# Patient Record
Sex: Female | Born: 1960
Health system: Southern US, Community
[De-identification: ages and names within clinical notes are randomized; demographics above are authoritative.]

## PROBLEM LIST (undated history)

## (undated) DIAGNOSIS — J329 Chronic sinusitis, unspecified: Secondary | ICD-10-CM

## (undated) DIAGNOSIS — M1990 Unspecified osteoarthritis, unspecified site: Secondary | ICD-10-CM

## (undated) DIAGNOSIS — R112 Nausea with vomiting, unspecified: Secondary | ICD-10-CM

## (undated) DIAGNOSIS — K219 Gastro-esophageal reflux disease without esophagitis: Secondary | ICD-10-CM

## (undated) DIAGNOSIS — Z9889 Other specified postprocedural states: Secondary | ICD-10-CM

## (undated) DIAGNOSIS — R51 Headache: Secondary | ICD-10-CM

## (undated) DIAGNOSIS — C50A0 Malignant inflammatory neoplasm of unspecified breast: Secondary | ICD-10-CM

## (undated) DIAGNOSIS — I82409 Acute embolism and thrombosis of unspecified deep veins of unspecified lower extremity: Secondary | ICD-10-CM

## (undated) DIAGNOSIS — C50919 Malignant neoplasm of unspecified site of unspecified female breast: Secondary | ICD-10-CM

## (undated) DIAGNOSIS — E119 Type 2 diabetes mellitus without complications: Secondary | ICD-10-CM

## (undated) DIAGNOSIS — J302 Other seasonal allergic rhinitis: Secondary | ICD-10-CM

## (undated) DIAGNOSIS — E559 Vitamin D deficiency, unspecified: Secondary | ICD-10-CM

## (undated) HISTORY — DX: Malignant neoplasm of unspecified site of unspecified female breast: C50.919

## (undated) HISTORY — DX: Chronic sinusitis, unspecified: J32.9

## (undated) HISTORY — DX: Gastro-esophageal reflux disease without esophagitis: K21.9

## (undated) HISTORY — DX: Type 2 diabetes mellitus without complications: E11.9

## (undated) HISTORY — DX: Unspecified osteoarthritis, unspecified site: M19.90

## (undated) HISTORY — DX: Malignant inflammatory neoplasm of unspecified breast: C50.A0

## (undated) HISTORY — DX: Acute embolism and thrombosis of unspecified deep veins of unspecified lower extremity: I82.409

## (undated) HISTORY — DX: Vitamin D deficiency, unspecified: E55.9

## (undated) HISTORY — PX: TUBAL LIGATION: SHX77

---
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
## 1996-11-11 HISTORY — PX: KNEE ARTHROSCOPY: SUR90

## 1999-04-12 ENCOUNTER — Other Ambulatory Visit: Admission: RE | Admit: 1999-04-12 | Discharge: 1999-04-12 | Payer: Self-pay | Admitting: Obstetrics and Gynecology

## 1999-05-09 ENCOUNTER — Encounter: Payer: Self-pay | Admitting: Obstetrics and Gynecology

## 1999-05-09 ENCOUNTER — Ambulatory Visit (HOSPITAL_COMMUNITY): Admission: RE | Admit: 1999-05-09 | Discharge: 1999-05-09 | Payer: Self-pay | Admitting: Obstetrics and Gynecology

## 1999-08-17 ENCOUNTER — Ambulatory Visit (HOSPITAL_COMMUNITY): Admission: RE | Admit: 1999-08-17 | Discharge: 1999-08-17 | Payer: Self-pay | Admitting: Obstetrics & Gynecology

## 1999-08-17 ENCOUNTER — Encounter: Payer: Self-pay | Admitting: Obstetrics & Gynecology

## 1999-09-26 ENCOUNTER — Inpatient Hospital Stay (HOSPITAL_COMMUNITY): Admission: AD | Admit: 1999-09-26 | Discharge: 1999-09-29 | Payer: Self-pay | Admitting: Obstetrics and Gynecology

## 1999-09-26 ENCOUNTER — Encounter (INDEPENDENT_AMBULATORY_CARE_PROVIDER_SITE_OTHER): Payer: Self-pay

## 2004-11-25 ENCOUNTER — Emergency Department (HOSPITAL_COMMUNITY): Admission: EM | Admit: 2004-11-25 | Discharge: 2004-11-25 | Payer: Self-pay | Admitting: Emergency Medicine

## 2004-11-29 ENCOUNTER — Emergency Department (HOSPITAL_COMMUNITY): Admission: EM | Admit: 2004-11-29 | Discharge: 2004-11-29 | Payer: Self-pay | Admitting: Emergency Medicine

## 2008-08-01 ENCOUNTER — Encounter: Admission: RE | Admit: 2008-08-01 | Discharge: 2008-08-01 | Payer: Self-pay | Admitting: Family Medicine

## 2008-08-02 ENCOUNTER — Encounter (HOSPITAL_COMMUNITY): Admission: RE | Admit: 2008-08-02 | Discharge: 2008-09-01 | Payer: Self-pay | Admitting: General Surgery

## 2008-08-02 ENCOUNTER — Ambulatory Visit (HOSPITAL_COMMUNITY): Payer: Self-pay | Admitting: Oncology

## 2008-08-09 ENCOUNTER — Ambulatory Visit (HOSPITAL_COMMUNITY): Admission: RE | Admit: 2008-08-09 | Discharge: 2008-08-09 | Payer: Self-pay | Admitting: General Surgery

## 2008-08-09 HISTORY — PX: PORTACATH PLACEMENT: SHX2246

## 2008-08-26 ENCOUNTER — Ambulatory Visit: Payer: Self-pay | Admitting: Genetic Counselor

## 2008-08-31 ENCOUNTER — Encounter (HOSPITAL_COMMUNITY): Admission: RE | Admit: 2008-08-31 | Discharge: 2008-09-30 | Payer: Self-pay | Admitting: Oncology

## 2008-09-05 ENCOUNTER — Encounter (HOSPITAL_COMMUNITY): Payer: Self-pay | Admitting: Oncology

## 2008-09-21 ENCOUNTER — Ambulatory Visit (HOSPITAL_COMMUNITY): Payer: Self-pay | Admitting: Oncology

## 2008-10-03 ENCOUNTER — Encounter (HOSPITAL_COMMUNITY): Payer: Self-pay | Admitting: Oncology

## 2008-10-03 ENCOUNTER — Ambulatory Visit: Payer: Self-pay | Admitting: Cardiology

## 2008-10-03 ENCOUNTER — Encounter (HOSPITAL_COMMUNITY): Admission: RE | Admit: 2008-10-03 | Discharge: 2008-11-02 | Payer: Self-pay | Admitting: Oncology

## 2008-10-12 DIAGNOSIS — I82409 Acute embolism and thrombosis of unspecified deep veins of unspecified lower extremity: Secondary | ICD-10-CM

## 2008-10-12 HISTORY — DX: Acute embolism and thrombosis of unspecified deep veins of unspecified lower extremity: I82.409

## 2008-10-18 ENCOUNTER — Inpatient Hospital Stay (HOSPITAL_COMMUNITY): Admission: EM | Admit: 2008-10-18 | Discharge: 2008-11-02 | Payer: Self-pay | Admitting: Emergency Medicine

## 2008-10-19 ENCOUNTER — Ambulatory Visit: Payer: Self-pay | Admitting: Pulmonary Disease

## 2008-10-19 ENCOUNTER — Ambulatory Visit: Payer: Self-pay | Admitting: Infectious Diseases

## 2008-10-19 ENCOUNTER — Ambulatory Visit: Payer: Self-pay | Admitting: Oncology

## 2008-10-20 ENCOUNTER — Encounter: Payer: Self-pay | Admitting: Internal Medicine

## 2008-10-25 ENCOUNTER — Ambulatory Visit: Payer: Self-pay | Admitting: Surgery

## 2008-10-25 ENCOUNTER — Encounter (INDEPENDENT_AMBULATORY_CARE_PROVIDER_SITE_OTHER): Payer: Self-pay | Admitting: Cardiovascular Disease

## 2008-10-25 HISTORY — PX: INCISION AND DRAINAGE ABSCESS: SHX5864

## 2008-11-07 ENCOUNTER — Ambulatory Visit (HOSPITAL_COMMUNITY): Payer: Self-pay | Admitting: Internal Medicine

## 2008-11-07 ENCOUNTER — Encounter (HOSPITAL_COMMUNITY): Admission: RE | Admit: 2008-11-07 | Discharge: 2008-11-10 | Payer: Self-pay | Admitting: Oncology

## 2008-11-08 ENCOUNTER — Ambulatory Visit (HOSPITAL_COMMUNITY): Payer: Self-pay | Admitting: Oncology

## 2008-11-11 ENCOUNTER — Encounter (HOSPITAL_COMMUNITY): Admission: RE | Admit: 2008-11-11 | Discharge: 2008-12-11 | Payer: Self-pay | Admitting: Oncology

## 2008-11-14 ENCOUNTER — Ambulatory Visit (HOSPITAL_COMMUNITY): Payer: Self-pay | Admitting: Oncology

## 2008-12-12 ENCOUNTER — Encounter (HOSPITAL_COMMUNITY): Admission: RE | Admit: 2008-12-12 | Discharge: 2009-01-11 | Payer: Self-pay | Admitting: Oncology

## 2008-12-30 ENCOUNTER — Ambulatory Visit (HOSPITAL_COMMUNITY): Payer: Self-pay | Admitting: Oncology

## 2009-01-02 ENCOUNTER — Ambulatory Visit (HOSPITAL_COMMUNITY): Admission: RE | Admit: 2009-01-02 | Discharge: 2009-01-02 | Payer: Self-pay | Admitting: Oncology

## 2009-01-17 ENCOUNTER — Encounter (HOSPITAL_COMMUNITY): Admission: RE | Admit: 2009-01-17 | Discharge: 2009-02-08 | Payer: Self-pay | Admitting: Oncology

## 2009-02-11 HISTORY — PX: MASTECTOMY: SHX3

## 2009-02-16 ENCOUNTER — Encounter (HOSPITAL_COMMUNITY): Admission: RE | Admit: 2009-02-16 | Discharge: 2009-03-18 | Payer: Self-pay | Admitting: Oncology

## 2009-02-16 ENCOUNTER — Ambulatory Visit (HOSPITAL_COMMUNITY): Payer: Self-pay | Admitting: Oncology

## 2009-02-23 ENCOUNTER — Encounter (INDEPENDENT_AMBULATORY_CARE_PROVIDER_SITE_OTHER): Payer: Self-pay | Admitting: General Surgery

## 2009-02-23 ENCOUNTER — Inpatient Hospital Stay (HOSPITAL_COMMUNITY): Admission: RE | Admit: 2009-02-23 | Discharge: 2009-03-01 | Payer: Self-pay | Admitting: General Surgery

## 2009-02-23 HISTORY — PX: MASTECTOMY: SHX3

## 2009-03-17 ENCOUNTER — Ambulatory Visit: Admission: RE | Admit: 2009-03-17 | Discharge: 2009-05-09 | Payer: Self-pay | Admitting: Radiation Oncology

## 2009-03-28 ENCOUNTER — Encounter (HOSPITAL_COMMUNITY): Admission: RE | Admit: 2009-03-28 | Discharge: 2009-04-27 | Payer: Self-pay | Admitting: Oncology

## 2009-04-04 ENCOUNTER — Ambulatory Visit (HOSPITAL_COMMUNITY): Payer: Self-pay | Admitting: Oncology

## 2009-05-02 ENCOUNTER — Encounter (HOSPITAL_COMMUNITY): Admission: RE | Admit: 2009-05-02 | Discharge: 2009-06-01 | Payer: Self-pay | Admitting: Oncology

## 2009-06-15 ENCOUNTER — Encounter (HOSPITAL_COMMUNITY): Admission: RE | Admit: 2009-06-15 | Discharge: 2009-07-15 | Payer: Self-pay | Admitting: Oncology

## 2009-06-15 ENCOUNTER — Ambulatory Visit (HOSPITAL_COMMUNITY): Payer: Self-pay | Admitting: Oncology

## 2009-07-27 ENCOUNTER — Encounter (HOSPITAL_COMMUNITY): Admission: RE | Admit: 2009-07-27 | Discharge: 2009-08-26 | Payer: Self-pay | Admitting: Oncology

## 2009-08-01 ENCOUNTER — Ambulatory Visit (HOSPITAL_COMMUNITY): Payer: Self-pay | Admitting: Oncology

## 2009-09-07 ENCOUNTER — Encounter (HOSPITAL_COMMUNITY): Admission: RE | Admit: 2009-09-07 | Discharge: 2009-10-07 | Payer: Self-pay | Admitting: Oncology

## 2009-10-12 ENCOUNTER — Ambulatory Visit (HOSPITAL_COMMUNITY): Payer: Self-pay | Admitting: Oncology

## 2009-10-12 ENCOUNTER — Encounter (HOSPITAL_COMMUNITY)
Admission: RE | Admit: 2009-10-12 | Discharge: 2009-11-10 | Payer: Self-pay | Source: Home / Self Care | Admitting: Oncology

## 2009-11-13 ENCOUNTER — Encounter (HOSPITAL_COMMUNITY)
Admission: RE | Admit: 2009-11-13 | Discharge: 2009-12-13 | Payer: Self-pay | Source: Home / Self Care | Admitting: Oncology

## 2009-12-15 ENCOUNTER — Ambulatory Visit (HOSPITAL_COMMUNITY): Payer: Self-pay | Admitting: Oncology

## 2009-12-15 ENCOUNTER — Encounter (HOSPITAL_COMMUNITY)
Admission: RE | Admit: 2009-12-15 | Discharge: 2010-01-14 | Payer: Self-pay | Source: Home / Self Care | Admitting: Oncology

## 2010-02-09 ENCOUNTER — Encounter (HOSPITAL_COMMUNITY)
Admission: RE | Admit: 2010-02-09 | Discharge: 2010-03-11 | Payer: Self-pay | Source: Home / Self Care | Attending: Oncology | Admitting: Oncology

## 2010-02-09 ENCOUNTER — Ambulatory Visit (HOSPITAL_COMMUNITY): Payer: Self-pay | Admitting: Oncology

## 2010-03-04 ENCOUNTER — Encounter (HOSPITAL_COMMUNITY): Payer: Self-pay | Admitting: Oncology

## 2010-03-09 LAB — CBC
HCT: 37.1 % (ref 36.0–46.0)
Hemoglobin: 12.4 g/dL (ref 12.0–15.0)
MCH: 27.7 pg (ref 26.0–34.0)
MCHC: 33.4 g/dL (ref 30.0–36.0)
MCV: 83 fL (ref 78.0–100.0)
Platelets: 240 K/uL (ref 150–400)
RBC: 4.47 MIL/uL (ref 3.87–5.11)
RDW: 14.1 % (ref 11.5–15.5)
WBC: 4.3 K/uL (ref 4.0–10.5)

## 2010-03-09 LAB — DIFFERENTIAL
Basophils Absolute: 0 K/uL (ref 0.0–0.1)
Basophils Relative: 1 % (ref 0–1)
Eosinophils Absolute: 0.3 K/uL (ref 0.0–0.7)
Eosinophils Relative: 6 % — ABNORMAL HIGH (ref 0–5)
Lymphocytes Relative: 35 % (ref 12–46)
Lymphs Abs: 1.5 K/uL (ref 0.7–4.0)
Monocytes Absolute: 0.4 K/uL (ref 0.1–1.0)
Monocytes Relative: 10 % (ref 3–12)
Neutro Abs: 2.1 K/uL (ref 1.7–7.7)
Neutrophils Relative %: 48 % (ref 43–77)

## 2010-03-09 LAB — COMPREHENSIVE METABOLIC PANEL
BUN: 11 mg/dL (ref 6–23)
CO2: 26 mEq/L (ref 19–32)
Calcium: 9.1 mg/dL (ref 8.4–10.5)
Chloride: 108 mEq/L (ref 96–112)
Creatinine, Ser: 0.71 mg/dL (ref 0.4–1.2)
Potassium: 3.7 mEq/L (ref 3.5–5.1)

## 2010-03-09 LAB — PROTIME-INR
INR: 2.26 — ABNORMAL HIGH (ref 0.00–1.49)
Prothrombin Time: 25.1 s — ABNORMAL HIGH (ref 11.6–15.2)

## 2010-03-26 ENCOUNTER — Ambulatory Visit (HOSPITAL_COMMUNITY): Payer: Self-pay | Admitting: Oncology

## 2010-03-26 DIAGNOSIS — C50919 Malignant neoplasm of unspecified site of unspecified female breast: Secondary | ICD-10-CM

## 2010-04-06 ENCOUNTER — Encounter (HOSPITAL_COMMUNITY): Payer: Self-pay | Attending: Oncology

## 2010-04-06 ENCOUNTER — Other Ambulatory Visit (HOSPITAL_COMMUNITY): Payer: Self-pay

## 2010-04-06 DIAGNOSIS — C50919 Malignant neoplasm of unspecified site of unspecified female breast: Secondary | ICD-10-CM

## 2010-04-06 DIAGNOSIS — Z853 Personal history of malignant neoplasm of breast: Secondary | ICD-10-CM | POA: Insufficient documentation

## 2010-04-06 DIAGNOSIS — Z452 Encounter for adjustment and management of vascular access device: Secondary | ICD-10-CM

## 2010-04-06 DIAGNOSIS — Z86718 Personal history of other venous thrombosis and embolism: Secondary | ICD-10-CM | POA: Insufficient documentation

## 2010-04-20 ENCOUNTER — Other Ambulatory Visit (HOSPITAL_COMMUNITY): Payer: Self-pay

## 2010-04-24 LAB — PROTIME-INR: INR: 2.47 — ABNORMAL HIGH (ref 0.00–1.49)

## 2010-04-26 LAB — DIFFERENTIAL
Basophils Absolute: 0 10*3/uL (ref 0.0–0.1)
Basophils Relative: 1 % (ref 0–1)
Lymphocytes Relative: 30 % (ref 12–46)
Monocytes Relative: 9 % (ref 3–12)
Neutro Abs: 2.4 10*3/uL (ref 1.7–7.7)
Neutrophils Relative %: 55 % (ref 43–77)

## 2010-04-26 LAB — COMPREHENSIVE METABOLIC PANEL
Alkaline Phosphatase: 74 U/L (ref 39–117)
BUN: 14 mg/dL (ref 6–23)
Creatinine, Ser: 0.75 mg/dL (ref 0.4–1.2)
Glucose, Bld: 129 mg/dL — ABNORMAL HIGH (ref 70–99)
Potassium: 3.6 mEq/L (ref 3.5–5.1)
Total Bilirubin: 0.4 mg/dL (ref 0.3–1.2)
Total Protein: 7.3 g/dL (ref 6.0–8.3)

## 2010-04-26 LAB — CBC
HCT: 35.9 % — ABNORMAL LOW (ref 36.0–46.0)
MCH: 28 pg (ref 26.0–34.0)
MCV: 83.8 fL (ref 78.0–100.0)
Platelets: 255 10*3/uL (ref 150–400)
RDW: 14.6 % (ref 11.5–15.5)
WBC: 4.3 10*3/uL (ref 4.0–10.5)

## 2010-04-26 LAB — PROTIME-INR
INR: 2.49 — ABNORMAL HIGH (ref 0.00–1.49)
INR: 2.21 — ABNORMAL HIGH (ref 0.00–1.49)
Prothrombin Time: 24.7 seconds — ABNORMAL HIGH (ref 11.6–15.2)

## 2010-04-28 LAB — COMPREHENSIVE METABOLIC PANEL
Alkaline Phosphatase: 78 U/L (ref 39–117)
BUN: 12 mg/dL (ref 6–23)
Calcium: 9.4 mg/dL (ref 8.4–10.5)
Creatinine, Ser: 0.84 mg/dL (ref 0.4–1.2)
Glucose, Bld: 78 mg/dL (ref 70–99)
Total Protein: 7.5 g/dL (ref 6.0–8.3)

## 2010-04-28 LAB — CBC
HCT: 36 % (ref 36.0–46.0)
MCHC: 33.5 g/dL (ref 30.0–36.0)
MCV: 83.6 fL (ref 78.0–100.0)
RDW: 15.8 % — ABNORMAL HIGH (ref 11.5–15.5)

## 2010-04-28 LAB — DIFFERENTIAL
Basophils Relative: 1 % (ref 0–1)
Lymphocytes Relative: 33 % (ref 12–46)
Lymphs Abs: 1.7 10*3/uL (ref 0.7–4.0)
Monocytes Relative: 10 % (ref 3–12)
Neutro Abs: 2.5 10*3/uL (ref 1.7–7.7)
Neutrophils Relative %: 49 % (ref 43–77)

## 2010-04-28 LAB — PROTIME-INR: INR: 1.85 — ABNORMAL HIGH (ref 0.00–1.49)

## 2010-04-29 LAB — COMPREHENSIVE METABOLIC PANEL
ALT: 29 U/L (ref 0–35)
AST: 33 U/L (ref 0–37)
Albumin: 3.8 g/dL (ref 3.5–5.2)
CO2: 26 mEq/L (ref 19–32)
Calcium: 9.5 mg/dL (ref 8.4–10.5)
GFR calc Af Amer: >60 mL/min (ref >60)
Sodium: 140 mEq/L (ref 135–145)
Total Protein: 6.6 g/dL (ref 6.0–8.3)

## 2010-04-29 LAB — CBC
HCT: 28.8 % — ABNORMAL LOW (ref 36.0–46.0)
HCT: 25.4 % — ABNORMAL LOW (ref 36.0–46.0)
Hemoglobin: 8.1 g/dL — ABNORMAL LOW (ref 12.0–15.0)
Hemoglobin: 9.8 g/dL — ABNORMAL LOW (ref 12.0–15.0)
Hemoglobin: 8.6 g/dL — ABNORMAL LOW (ref 12.0–15.0)
MCHC: 33.6 g/dL (ref 30.0–36.0)
MCHC: 34.2 g/dL (ref 30.0–36.0)
MCHC: 33.4 g/dL (ref 30.0–36.0)
MCHC: 33.7 g/dL (ref 30.0–36.0)
MCV: 91.7 fL (ref 78.0–100.0)
MCV: 92.9 fL (ref 78.0–100.0)
MCV: 92.5 fL (ref 78.0–100.0)
MCV: 92.9 fL (ref 78.0–100.0)
MCV: 93.1 fL (ref 78.0–100.0)
Platelets: 187 10*3/uL (ref 150–400)
Platelets: 195 10*3/uL (ref 150–400)
RBC: 3.75 MIL/uL — ABNORMAL LOW (ref 3.87–5.11)
RBC: 2.94 MIL/uL — ABNORMAL LOW (ref 3.87–5.11)
RBC: 2.95 MIL/uL — ABNORMAL LOW (ref 3.87–5.11)
RDW: 17.4 % — ABNORMAL HIGH (ref 11.5–15.5)
RDW: 17.6 % — ABNORMAL HIGH (ref 11.5–15.5)
RDW: 17.9 % — ABNORMAL HIGH (ref 11.5–15.5)
RDW: 17.1 % — ABNORMAL HIGH (ref 11.5–15.5)
RDW: 17.2 % — ABNORMAL HIGH (ref 11.5–15.5)
RDW: 18 % — ABNORMAL HIGH (ref 11.5–15.5)
WBC: 6 10*3/uL (ref 4.0–10.5)
WBC: 6.4 10*3/uL (ref 4.0–10.5)

## 2010-04-29 LAB — PROTIME-INR
INR: 1.2 (ref 0.00–1.49)
INR: 1.29 (ref 0.00–1.49)
INR: 1.4 (ref 0.00–1.49)
INR: 2.32 — ABNORMAL HIGH (ref 0.00–1.49)
Prothrombin Time: 14.8 seconds (ref 11.6–15.2)
Prothrombin Time: 14.3 seconds (ref 11.6–15.2)
Prothrombin Time: 15.1 seconds (ref 11.6–15.2)
Prothrombin Time: 16 seconds — ABNORMAL HIGH (ref 11.6–15.2)
Prothrombin Time: 17 seconds — ABNORMAL HIGH (ref 11.6–15.2)
Prothrombin Time: 23.9 seconds — ABNORMAL HIGH (ref 11.6–15.2)

## 2010-04-29 LAB — URINALYSIS, DIPSTICK ONLY
Glucose, UA: NEGATIVE mg/dL
Hgb urine dipstick: NEGATIVE
Urobilinogen, UA: 0.2 mg/dL (ref 0.0–1.0)

## 2010-04-29 LAB — CROSSMATCH: ABO/RH(D): O POS

## 2010-04-29 LAB — HEPARIN LEVEL (UNFRACTIONATED)
Heparin Unfractionated: 0.11 IU/mL — ABNORMAL LOW (ref 0.30–0.70)
Heparin Unfractionated: 0.48 IU/mL (ref 0.30–0.70)
Heparin Unfractionated: 0.59 IU/mL (ref 0.30–0.70)
Heparin Unfractionated: <0.1 IU/mL — ABNORMAL LOW (ref 0.30–0.70)

## 2010-04-29 LAB — BASIC METABOLIC PANEL
BUN: 17 mg/dL (ref 6–23)
Chloride: 104 mEq/L (ref 96–112)
Glucose, Bld: 151 mg/dL — ABNORMAL HIGH (ref 70–99)
Potassium: 4.1 mEq/L (ref 3.5–5.1)

## 2010-04-29 LAB — DIFFERENTIAL
Eosinophils Absolute: 0.9 10*3/uL — ABNORMAL HIGH (ref 0.0–0.7)
Eosinophils Relative: 18 % — ABNORMAL HIGH (ref 0–5)
Lymphs Abs: 1.7 10*3/uL (ref 0.7–4.0)
Monocytes Absolute: 0.6 10*3/uL (ref 0.1–1.0)
Monocytes Relative: 12 % (ref 3–12)

## 2010-05-01 LAB — PROTIME-INR
INR: 2.04 — ABNORMAL HIGH (ref 0.00–1.49)
Prothrombin Time: 23.4 seconds — ABNORMAL HIGH (ref 11.6–15.2)

## 2010-05-02 LAB — PROTIME-INR
INR: 2.23 — ABNORMAL HIGH (ref 0.00–1.49)
Prothrombin Time: 24.5 seconds — ABNORMAL HIGH (ref 11.6–15.2)
Prothrombin Time: 19.8 seconds — ABNORMAL HIGH (ref 11.6–15.2)

## 2010-05-03 ENCOUNTER — Encounter (HOSPITAL_COMMUNITY): Payer: Self-pay | Attending: Oncology

## 2010-05-03 ENCOUNTER — Other Ambulatory Visit (HOSPITAL_COMMUNITY): Payer: Self-pay

## 2010-05-03 DIAGNOSIS — Z86718 Personal history of other venous thrombosis and embolism: Secondary | ICD-10-CM | POA: Insufficient documentation

## 2010-05-03 DIAGNOSIS — Z7901 Long term (current) use of anticoagulants: Secondary | ICD-10-CM | POA: Insufficient documentation

## 2010-05-03 DIAGNOSIS — C50919 Malignant neoplasm of unspecified site of unspecified female breast: Secondary | ICD-10-CM

## 2010-05-07 LAB — PROTIME-INR
INR: 1.96 — ABNORMAL HIGH (ref 0.00–1.49)
INR: 2.18 — ABNORMAL HIGH (ref 0.00–1.49)
INR: 2.35 — ABNORMAL HIGH (ref 0.00–1.49)
Prothrombin Time: 22.2 seconds — ABNORMAL HIGH (ref 11.6–15.2)
Prothrombin Time: 24.1 seconds — ABNORMAL HIGH (ref 11.6–15.2)
Prothrombin Time: 25.5 seconds — ABNORMAL HIGH (ref 11.6–15.2)

## 2010-05-15 LAB — COMPREHENSIVE METABOLIC PANEL
ALT: 14 U/L (ref 0–35)
AST: 21 U/L (ref 0–37)
CO2: 22 mEq/L (ref 19–32)
Chloride: 105 mEq/L (ref 96–112)
Creatinine, Ser: 0.6 mg/dL (ref 0.4–1.2)
GFR calc Af Amer: >60 mL/min (ref >60)
GFR calc non Af Amer: >60 mL/min (ref >60)
Glucose, Bld: 184 mg/dL — ABNORMAL HIGH (ref 70–99)
Sodium: 137 mEq/L (ref 135–145)
Total Bilirubin: 0.3 mg/dL (ref 0.3–1.2)

## 2010-05-15 LAB — DIFFERENTIAL
Basophils Absolute: 0 10*3/uL (ref 0.0–0.1)
Eosinophils Absolute: 0 10*3/uL (ref 0.0–0.7)
Eosinophils Relative: 0 % (ref 0–5)

## 2010-05-15 LAB — CBC
HCT: 30.3 % — ABNORMAL LOW (ref 36.0–46.0)
MCHC: 34.1 g/dL (ref 30.0–36.0)
MCV: 90.2 fL (ref 78.0–100.0)
Platelets: 187 10*3/uL (ref 150–400)

## 2010-05-15 LAB — PROTIME-INR: Prothrombin Time: 25 seconds — ABNORMAL HIGH (ref 11.6–15.2)

## 2010-05-16 LAB — DIFFERENTIAL
Basophils Absolute: 0 10*3/uL (ref 0.0–0.1)
Basophils Absolute: 0.1 10*3/uL (ref 0.0–0.1)
Basophils Relative: 1 % (ref 0–1)
Eosinophils Absolute: 0 10*3/uL (ref 0.0–0.7)
Eosinophils Relative: 0 % (ref 0–5)
Lymphocytes Relative: 22 % (ref 12–46)
Lymphs Abs: 1.7 10*3/uL (ref 0.7–4.0)
Monocytes Absolute: 0.3 10*3/uL (ref 0.1–1.0)
Monocytes Relative: 25 % — ABNORMAL HIGH (ref 3–12)
Neutro Abs: 1.6 10*3/uL — ABNORMAL LOW (ref 1.7–7.7)

## 2010-05-16 LAB — COMPREHENSIVE METABOLIC PANEL
ALT: 15 U/L (ref 0–35)
AST: 20 U/L (ref 0–37)
Albumin: 3.4 g/dL — ABNORMAL LOW (ref 3.5–5.2)
Alkaline Phosphatase: 58 U/L (ref 39–117)
BUN: 8 mg/dL (ref 6–23)
Chloride: 104 mEq/L (ref 96–112)
Potassium: 3.1 mEq/L — ABNORMAL LOW (ref 3.5–5.1)
Total Bilirubin: 0.5 mg/dL (ref 0.3–1.2)

## 2010-05-16 LAB — CBC
HCT: 30.6 % — ABNORMAL LOW (ref 36.0–46.0)
Hemoglobin: 10.6 g/dL — ABNORMAL LOW (ref 12.0–15.0)
Platelets: 163 10*3/uL (ref 150–400)
RBC: 3.56 MIL/uL — ABNORMAL LOW (ref 3.87–5.11)
WBC: 3.7 10*3/uL — ABNORMAL LOW (ref 4.0–10.5)
WBC: 7.8 10*3/uL (ref 4.0–10.5)

## 2010-05-16 LAB — PROTIME-INR
INR: 1.77 — ABNORMAL HIGH (ref 0.00–1.49)
Prothrombin Time: 20.5 seconds — ABNORMAL HIGH (ref 11.6–15.2)

## 2010-05-17 LAB — COMPREHENSIVE METABOLIC PANEL
ALT: 19 U/L (ref 0–35)
AST: 29 U/L (ref 0–37)
Albumin: 3.1 g/dL — ABNORMAL LOW (ref 3.5–5.2)
CO2: 23 mEq/L (ref 19–32)
CO2: 27 mEq/L (ref 19–32)
Calcium: 9.3 mg/dL (ref 8.4–10.5)
Calcium: 8.8 mg/dL (ref 8.4–10.5)
Chloride: 106 mEq/L (ref 96–112)
Creatinine, Ser: 0.47 mg/dL (ref 0.4–1.2)
Creatinine, Ser: 0.62 mg/dL (ref 0.4–1.2)
GFR calc Af Amer: >60 mL/min (ref >60)
GFR calc non Af Amer: >60 mL/min (ref >60)
GFR calc non Af Amer: >60 mL/min (ref >60)
Glucose, Bld: 157 mg/dL — ABNORMAL HIGH (ref 70–99)
Sodium: 139 mEq/L (ref 135–145)
Total Bilirubin: 0.3 mg/dL (ref 0.3–1.2)
Total Protein: 6.8 g/dL (ref 6.0–8.3)

## 2010-05-17 LAB — CBC
HCT: 28.9 % — ABNORMAL LOW (ref 36.0–46.0)
HCT: 27.6 % — ABNORMAL LOW (ref 36.0–46.0)
Hemoglobin: 9.9 g/dL — ABNORMAL LOW (ref 12.0–15.0)
Hemoglobin: 9.4 g/dL — ABNORMAL LOW (ref 12.0–15.0)
MCHC: 34.2 g/dL (ref 30.0–36.0)
MCHC: 34.2 g/dL (ref 30.0–36.0)
MCHC: 35.2 g/dL (ref 30.0–36.0)
MCV: 87.9 fL (ref 78.0–100.0)
MCV: 86.9 fL (ref 78.0–100.0)
MCV: 89.6 fL (ref 78.0–100.0)
Platelets: 198 10*3/uL (ref 150–400)
RBC: 3.29 MIL/uL — ABNORMAL LOW (ref 3.87–5.11)
RBC: 3.12 MIL/uL — ABNORMAL LOW (ref 3.87–5.11)
RBC: 3.17 MIL/uL — ABNORMAL LOW (ref 3.87–5.11)
RDW: 19 % — ABNORMAL HIGH (ref 11.5–15.5)

## 2010-05-17 LAB — DIFFERENTIAL
Basophils Absolute: 0 10*3/uL (ref 0.0–0.1)
Basophils Relative: 1 % (ref 0–1)
Eosinophils Absolute: 0 10*3/uL (ref 0.0–0.7)
Eosinophils Absolute: 0 10*3/uL (ref 0.0–0.7)
Eosinophils Absolute: 0 10*3/uL (ref 0.0–0.7)
Eosinophils Relative: 0 % (ref 0–5)
Eosinophils Relative: 0 % (ref 0–5)
Lymphocytes Relative: 14 % (ref 12–46)
Lymphocytes Relative: 12 % (ref 12–46)
Lymphs Abs: 1 10*3/uL (ref 0.7–4.0)
Lymphs Abs: 1.6 10*3/uL (ref 0.7–4.0)
Monocytes Absolute: 0.2 10*3/uL (ref 0.1–1.0)
Monocytes Absolute: 1.6 10*3/uL — ABNORMAL HIGH (ref 0.1–1.0)
Monocytes Relative: 12 % (ref 3–12)
Monocytes Relative: 5 % (ref 3–12)
Neutrophils Relative %: 83 % — ABNORMAL HIGH (ref 43–77)
Neutrophils Relative %: 74 % (ref 43–77)

## 2010-05-17 LAB — PROTIME-INR
INR: 2.35 — ABNORMAL HIGH (ref 0.00–1.49)
Prothrombin Time: 18.3 seconds — ABNORMAL HIGH (ref 11.6–15.2)

## 2010-05-17 LAB — MAGNESIUM: Magnesium: 2 mg/dL (ref 1.5–2.5)

## 2010-05-18 LAB — GLUCOSE, CAPILLARY
Glucose-Capillary: 114 mg/dL — ABNORMAL HIGH (ref 70–99)
Glucose-Capillary: 118 mg/dL — ABNORMAL HIGH (ref 70–99)
Glucose-Capillary: 119 mg/dL — ABNORMAL HIGH (ref 70–99)
Glucose-Capillary: 120 mg/dL — ABNORMAL HIGH (ref 70–99)
Glucose-Capillary: 121 mg/dL — ABNORMAL HIGH (ref 70–99)
Glucose-Capillary: 121 mg/dL — ABNORMAL HIGH (ref 70–99)
Glucose-Capillary: 123 mg/dL — ABNORMAL HIGH (ref 70–99)
Glucose-Capillary: 141 mg/dL — ABNORMAL HIGH (ref 70–99)
Glucose-Capillary: 145 mg/dL — ABNORMAL HIGH (ref 70–99)
Glucose-Capillary: 81 mg/dL (ref 70–99)
Glucose-Capillary: 100 mg/dL — ABNORMAL HIGH (ref 70–99)
Glucose-Capillary: 105 mg/dL — ABNORMAL HIGH (ref 70–99)
Glucose-Capillary: 115 mg/dL — ABNORMAL HIGH (ref 70–99)
Glucose-Capillary: 115 mg/dL — ABNORMAL HIGH (ref 70–99)
Glucose-Capillary: 118 mg/dL — ABNORMAL HIGH (ref 70–99)
Glucose-Capillary: 119 mg/dL — ABNORMAL HIGH (ref 70–99)
Glucose-Capillary: 121 mg/dL — ABNORMAL HIGH (ref 70–99)
Glucose-Capillary: 122 mg/dL — ABNORMAL HIGH (ref 70–99)
Glucose-Capillary: 124 mg/dL — ABNORMAL HIGH (ref 70–99)
Glucose-Capillary: 125 mg/dL — ABNORMAL HIGH (ref 70–99)
Glucose-Capillary: 132 mg/dL — ABNORMAL HIGH (ref 70–99)
Glucose-Capillary: 133 mg/dL — ABNORMAL HIGH (ref 70–99)
Glucose-Capillary: 134 mg/dL — ABNORMAL HIGH (ref 70–99)
Glucose-Capillary: 143 mg/dL — ABNORMAL HIGH (ref 70–99)
Glucose-Capillary: 146 mg/dL — ABNORMAL HIGH (ref 70–99)
Glucose-Capillary: 150 mg/dL — ABNORMAL HIGH (ref 70–99)
Glucose-Capillary: 152 mg/dL — ABNORMAL HIGH (ref 70–99)
Glucose-Capillary: 157 mg/dL — ABNORMAL HIGH (ref 70–99)

## 2010-05-18 LAB — BASIC METABOLIC PANEL
BUN: 5 mg/dL — ABNORMAL LOW (ref 6–23)
BUN: 39 mg/dL — ABNORMAL HIGH (ref 6–23)
BUN: 5 mg/dL — ABNORMAL LOW (ref 6–23)
CO2: 18 mEq/L — ABNORMAL LOW (ref 19–32)
CO2: 19 mEq/L (ref 19–32)
CO2: 19 mEq/L (ref 19–32)
CO2: 32 mEq/L (ref 19–32)
Calcium: 8.4 mg/dL (ref 8.4–10.5)
Calcium: 7.4 mg/dL — ABNORMAL LOW (ref 8.4–10.5)
Calcium: 7.9 mg/dL — ABNORMAL LOW (ref 8.4–10.5)
Chloride: 108 mEq/L (ref 96–112)
Chloride: 110 mEq/L (ref 96–112)
Creatinine, Ser: 0.59 mg/dL (ref 0.4–1.2)
Creatinine, Ser: 0.56 mg/dL (ref 0.4–1.2)
GFR calc Af Amer: 43 mL/min — ABNORMAL LOW (ref >60)
GFR calc Af Amer: >60 mL/min (ref >60)
GFR calc non Af Amer: >60 mL/min (ref >60)
GFR calc non Af Amer: 28 mL/min — ABNORMAL LOW (ref >60)
GFR calc non Af Amer: >60 mL/min (ref >60)
GFR calc non Af Amer: >60 mL/min (ref >60)
GFR calc non Af Amer: >60 mL/min (ref >60)
Glucose, Bld: 121 mg/dL — ABNORMAL HIGH (ref 70–99)
Glucose, Bld: 118 mg/dL — ABNORMAL HIGH (ref 70–99)
Glucose, Bld: 127 mg/dL — ABNORMAL HIGH (ref 70–99)
Glucose, Bld: 247 mg/dL — ABNORMAL HIGH (ref 70–99)
Glucose, Bld: 247 mg/dL — ABNORMAL HIGH (ref 70–99)
Potassium: 3.2 mEq/L — ABNORMAL LOW (ref 3.5–5.1)
Potassium: 3.3 mEq/L — ABNORMAL LOW (ref 3.5–5.1)
Potassium: 3.5 mEq/L (ref 3.5–5.1)
Sodium: 135 mEq/L (ref 135–145)
Sodium: 136 mEq/L (ref 135–145)
Sodium: 137 mEq/L (ref 135–145)
Sodium: 140 mEq/L (ref 135–145)

## 2010-05-18 LAB — CBC
HCT: 26.3 % — ABNORMAL LOW (ref 36.0–46.0)
HCT: 26.5 % — ABNORMAL LOW (ref 36.0–46.0)
HCT: 27.8 % — ABNORMAL LOW (ref 36.0–46.0)
HCT: 28.2 % — ABNORMAL LOW (ref 36.0–46.0)
HCT: 18.9 % — ABNORMAL LOW (ref 36.0–46.0)
HCT: 20.6 % — ABNORMAL LOW (ref 36.0–46.0)
HCT: 21 % — ABNORMAL LOW (ref 36.0–46.0)
HCT: 21.6 % — ABNORMAL LOW (ref 36.0–46.0)
HCT: 23.8 % — ABNORMAL LOW (ref 36.0–46.0)
HCT: 20.6 % — ABNORMAL LOW (ref 36.0–46.0)
HCT: 26.3 % — ABNORMAL LOW (ref 36.0–46.0)
Hemoglobin: 8.8 g/dL — ABNORMAL LOW (ref 12.0–15.0)
Hemoglobin: 9.1 g/dL — ABNORMAL LOW (ref 12.0–15.0)
Hemoglobin: 9.3 g/dL — ABNORMAL LOW (ref 12.0–15.0)
Hemoglobin: 9.4 g/dL — ABNORMAL LOW (ref 12.0–15.0)
Hemoglobin: 10.1 g/dL — ABNORMAL LOW (ref 12.0–15.0)
Hemoglobin: 6.4 g/dL — CL (ref 12.0–15.0)
Hemoglobin: 7.1 g/dL — CL (ref 12.0–15.0)
Hemoglobin: 7.3 g/dL — CL (ref 12.0–15.0)
Hemoglobin: 7.3 g/dL — CL (ref 12.0–15.0)
Hemoglobin: 7.4 g/dL — CL (ref 12.0–15.0)
Hemoglobin: 8.2 g/dL — ABNORMAL LOW (ref 12.0–15.0)
Hemoglobin: 8.4 g/dL — ABNORMAL LOW (ref 12.0–15.0)
Hemoglobin: 7.2 g/dL — CL (ref 12.0–15.0)
MCHC: 33.5 g/dL (ref 30.0–36.0)
MCHC: 33.5 g/dL (ref 30.0–36.0)
MCHC: 33.6 g/dL (ref 30.0–36.0)
MCHC: 33.8 g/dL (ref 30.0–36.0)
MCHC: 34.4 g/dL (ref 30.0–36.0)
MCHC: 33.7 g/dL (ref 30.0–36.0)
MCHC: 34 g/dL (ref 30.0–36.0)
MCHC: 34 g/dL (ref 30.0–36.0)
MCHC: 34.1 g/dL (ref 30.0–36.0)
MCHC: 34.6 g/dL (ref 30.0–36.0)
MCV: 88.7 fL (ref 78.0–100.0)
MCV: 88.9 fL (ref 78.0–100.0)
MCV: 88.9 fL (ref 78.0–100.0)
MCV: 89.1 fL (ref 78.0–100.0)
MCV: 84.1 fL (ref 78.0–100.0)
MCV: 84.8 fL (ref 78.0–100.0)
MCV: 88 fL (ref 78.0–100.0)
MCV: 83.4 fL (ref 78.0–100.0)
Platelets: 268 10*3/uL (ref 150–400)
Platelets: 282 10*3/uL (ref 150–400)
Platelets: 287 10*3/uL (ref 150–400)
Platelets: 336 10*3/uL (ref 150–400)
Platelets: 113 10*3/uL — ABNORMAL LOW (ref 150–400)
Platelets: 134 10*3/uL — ABNORMAL LOW (ref 150–400)
Platelets: 202 10*3/uL (ref 150–400)
Platelets: 39 10*3/uL — CL (ref 150–400)
RBC: 2.95 MIL/uL — ABNORMAL LOW (ref 3.87–5.11)
RBC: 3.36 MIL/uL — ABNORMAL LOW (ref 3.87–5.11)
RBC: 3.33 MIL/uL — ABNORMAL LOW (ref 3.87–5.11)
RBC: 2.58 MIL/uL — ABNORMAL LOW (ref 3.87–5.11)
RBC: 2.49 MIL/uL — ABNORMAL LOW (ref 3.87–5.11)
RBC: 3.15 MIL/uL — ABNORMAL LOW (ref 3.87–5.11)
RDW: 20.5 % — ABNORMAL HIGH (ref 11.5–15.5)
RDW: 20.6 % — ABNORMAL HIGH (ref 11.5–15.5)
RDW: 20.8 % — ABNORMAL HIGH (ref 11.5–15.5)
RDW: 21.4 % — ABNORMAL HIGH (ref 11.5–15.5)
RDW: 21.5 % — ABNORMAL HIGH (ref 11.5–15.5)
RDW: 21.3 % — ABNORMAL HIGH (ref 11.5–15.5)
RDW: 21.4 % — ABNORMAL HIGH (ref 11.5–15.5)
RDW: 22.1 % — ABNORMAL HIGH (ref 11.5–15.5)
RDW: 22.1 % — ABNORMAL HIGH (ref 11.5–15.5)
RDW: 22.5 % — ABNORMAL HIGH (ref 11.5–15.5)
RDW: 22.8 % — ABNORMAL HIGH (ref 11.5–15.5)
WBC: 1.2 10*3/uL — CL (ref 4.0–10.5)
WBC: 2.1 10*3/uL — ABNORMAL LOW (ref 4.0–10.5)
WBC: 3.4 10*3/uL — ABNORMAL LOW (ref 4.0–10.5)
WBC: 1.4 10*3/uL — CL (ref 4.0–10.5)
WBC: 0.5 10*3/uL — CL (ref 4.0–10.5)
WBC: 3.3 10*3/uL — ABNORMAL LOW (ref 4.0–10.5)
WBC: 3.6 10*3/uL — ABNORMAL LOW (ref 4.0–10.5)
WBC: <0.4 10*3/uL — CL (ref 4.0–10.5)
WBC: <0.4 10*3/uL — CL (ref 4.0–10.5)

## 2010-05-18 LAB — COMPREHENSIVE METABOLIC PANEL
ALT: 14 U/L (ref 0–35)
ALT: 15 U/L (ref 0–35)
ALT: 15 U/L (ref 0–35)
ALT: 14 U/L (ref 0–35)
ALT: 24 U/L (ref 0–35)
AST: 18 U/L (ref 0–37)
AST: 24 U/L (ref 0–37)
AST: 18 U/L (ref 0–37)
AST: 33 U/L (ref 0–37)
Albumin: 2.1 g/dL — ABNORMAL LOW (ref 3.5–5.2)
Albumin: 2.2 g/dL — ABNORMAL LOW (ref 3.5–5.2)
Albumin: 2.4 g/dL — ABNORMAL LOW (ref 3.5–5.2)
Albumin: 1.8 g/dL — ABNORMAL LOW (ref 3.5–5.2)
Alkaline Phosphatase: 64 U/L (ref 39–117)
Alkaline Phosphatase: 67 U/L (ref 39–117)
Alkaline Phosphatase: 70 U/L (ref 39–117)
Alkaline Phosphatase: 60 U/L (ref 39–117)
Alkaline Phosphatase: 31 U/L — ABNORMAL LOW (ref 39–117)
BUN: 3 mg/dL — ABNORMAL LOW (ref 6–23)
BUN: 4 mg/dL — ABNORMAL LOW (ref 6–23)
BUN: 4 mg/dL — ABNORMAL LOW (ref 6–23)
BUN: 9 mg/dL (ref 6–23)
BUN: 51 mg/dL — ABNORMAL HIGH (ref 6–23)
CO2: 29 mEq/L (ref 19–32)
CO2: 31 mEq/L (ref 19–32)
CO2: 27 mEq/L (ref 19–32)
CO2: 20 mEq/L (ref 19–32)
Calcium: 8.8 mg/dL (ref 8.4–10.5)
Calcium: 8.6 mg/dL (ref 8.4–10.5)
Calcium: 7.7 mg/dL — ABNORMAL LOW (ref 8.4–10.5)
Chloride: 101 mEq/L (ref 96–112)
Chloride: 104 mEq/L (ref 96–112)
Chloride: 105 mEq/L (ref 96–112)
Chloride: 103 mEq/L (ref 96–112)
Chloride: 105 mEq/L (ref 96–112)
Chloride: 95 mEq/L — ABNORMAL LOW (ref 96–112)
Creatinine, Ser: 0.53 mg/dL (ref 0.4–1.2)
Creatinine, Ser: 0.57 mg/dL (ref 0.4–1.2)
Creatinine, Ser: 0.68 mg/dL (ref 0.4–1.2)
Creatinine, Ser: 0.63 mg/dL (ref 0.4–1.2)
Creatinine, Ser: 4.16 mg/dL — ABNORMAL HIGH (ref 0.4–1.2)
GFR calc Af Amer: >60 mL/min (ref >60)
GFR calc Af Amer: >60 mL/min (ref >60)
GFR calc Af Amer: 14 mL/min — ABNORMAL LOW (ref >60)
GFR calc non Af Amer: >60 mL/min (ref >60)
GFR calc non Af Amer: >60 mL/min (ref >60)
GFR calc non Af Amer: >60 mL/min (ref >60)
GFR calc non Af Amer: 11 mL/min — ABNORMAL LOW (ref >60)
Glucose, Bld: 100 mg/dL — ABNORMAL HIGH (ref 70–99)
Glucose, Bld: 105 mg/dL — ABNORMAL HIGH (ref 70–99)
Glucose, Bld: 123 mg/dL — ABNORMAL HIGH (ref 70–99)
Glucose, Bld: 126 mg/dL — ABNORMAL HIGH (ref 70–99)
Glucose, Bld: 144 mg/dL — ABNORMAL HIGH (ref 70–99)
Glucose, Bld: 126 mg/dL — ABNORMAL HIGH (ref 70–99)
Glucose, Bld: 302 mg/dL — ABNORMAL HIGH (ref 70–99)
Potassium: 3.5 mEq/L (ref 3.5–5.1)
Potassium: 3.9 mEq/L (ref 3.5–5.1)
Potassium: 3.3 mEq/L — ABNORMAL LOW (ref 3.5–5.1)
Potassium: 3.1 mEq/L — ABNORMAL LOW (ref 3.5–5.1)
Sodium: 142 mEq/L (ref 135–145)
Sodium: 142 mEq/L (ref 135–145)
Sodium: 135 mEq/L (ref 135–145)
Total Bilirubin: 0.4 mg/dL (ref 0.3–1.2)
Total Bilirubin: 0.6 mg/dL (ref 0.3–1.2)
Total Bilirubin: 0.7 mg/dL (ref 0.3–1.2)
Total Bilirubin: 0.7 mg/dL (ref 0.3–1.2)
Total Bilirubin: 0.4 mg/dL (ref 0.3–1.2)
Total Bilirubin: 0.6 mg/dL (ref 0.3–1.2)
Total Bilirubin: 0.7 mg/dL (ref 0.3–1.2)
Total Protein: 5.4 g/dL — ABNORMAL LOW (ref 6.0–8.3)
Total Protein: 5.8 g/dL — ABNORMAL LOW (ref 6.0–8.3)
Total Protein: 4.5 g/dL — ABNORMAL LOW (ref 6.0–8.3)

## 2010-05-18 LAB — CULTURE, BLOOD (ROUTINE X 2)
Culture: NO GROWTH
Culture: NO GROWTH
Culture: NO GROWTH

## 2010-05-18 LAB — DIFFERENTIAL
Basophils Absolute: 0 10*3/uL (ref 0.0–0.1)
Basophils Absolute: 0 10*3/uL (ref 0.0–0.1)
Basophils Absolute: 0 10*3/uL (ref 0.0–0.1)
Basophils Absolute: 0 10*3/uL (ref 0.0–0.1)
Basophils Absolute: 0 10*3/uL (ref 0.0–0.1)
Basophils Absolute: 0 10*3/uL (ref 0.0–0.1)
Basophils Relative: 0 % (ref 0–1)
Basophils Relative: 1 % (ref 0–1)
Basophils Relative: 1 % (ref 0–1)
Basophils Relative: 2 % — ABNORMAL HIGH (ref 0–1)
Basophils Relative: 2 % — ABNORMAL HIGH (ref 0–1)
Basophils Relative: 0 % (ref 0–1)
Blasts: 0 %
Eosinophils Absolute: 0 10*3/uL (ref 0.0–0.7)
Eosinophils Absolute: 0 10*3/uL (ref 0.0–0.7)
Eosinophils Absolute: 0 10*3/uL (ref 0.0–0.7)
Eosinophils Absolute: 0 10*3/uL (ref 0.0–0.7)
Eosinophils Absolute: 0 10*3/uL (ref 0.0–0.7)
Eosinophils Absolute: 0 10*3/uL (ref 0.0–0.7)
Eosinophils Absolute: 0 10*3/uL (ref 0.0–0.7)
Eosinophils Relative: 0 % (ref 0–5)
Eosinophils Relative: 0 % (ref 0–5)
Eosinophils Relative: 0 % (ref 0–5)
Eosinophils Relative: 0 % (ref 0–5)
Lymphocytes Relative: 13 % (ref 12–46)
Lymphocytes Relative: 21 % (ref 12–46)
Lymphocytes Relative: 35 % (ref 12–46)
Lymphocytes Relative: 50 % — ABNORMAL HIGH (ref 12–46)
Lymphocytes Relative: 12 % (ref 12–46)
Lymphs Abs: 0.4 10*3/uL — ABNORMAL LOW (ref 0.7–4.0)
Lymphs Abs: 0.7 10*3/uL (ref 0.7–4.0)
Metamyelocytes Relative: 0 %
Monocytes Absolute: 0.2 10*3/uL (ref 0.1–1.0)
Monocytes Absolute: 0.4 10*3/uL (ref 0.1–1.0)
Monocytes Relative: 13 % — ABNORMAL HIGH (ref 3–12)
Monocytes Relative: 20 % — ABNORMAL HIGH (ref 3–12)
Monocytes Relative: 7 % (ref 3–12)
Monocytes Relative: 7 % (ref 3–12)
Neutro Abs: 1 10*3/uL — ABNORMAL LOW (ref 1.7–7.7)
Neutro Abs: 1.1 10*3/uL — ABNORMAL LOW (ref 1.7–7.7)
Neutro Abs: 1.6 10*3/uL — ABNORMAL LOW (ref 1.7–7.7)
Neutro Abs: 2.6 10*3/uL (ref 1.7–7.7)
Neutro Abs: 1.6 10*3/uL — ABNORMAL LOW (ref 1.7–7.7)
Neutrophils Relative %: 48 % (ref 43–77)
Neutrophils Relative %: 48 % (ref 43–77)
Neutrophils Relative %: 65 % (ref 43–77)
Neutrophils Relative %: 7 % — ABNORMAL LOW (ref 43–77)
Neutrophils Relative %: 81 % — ABNORMAL HIGH (ref 43–77)
Neutrophils Relative %: 81 % — ABNORMAL HIGH (ref 43–77)
nRBC: 0 /100 WBC
nRBC: 0 /100 WBC

## 2010-05-18 LAB — URINALYSIS, ROUTINE W REFLEX MICROSCOPIC
Bilirubin Urine: NEGATIVE
Glucose, UA: 100 mg/dL — AB
Specific Gravity, Urine: 1.03 (ref 1.005–1.030)
pH: 5 (ref 5.0–8.0)

## 2010-05-18 LAB — HSV(HERPES SMPLX)ABS-I+II(IGG+IGM)-BLD
Herpes Simplex Vrs I + II Ab, IgG: 33.1 IV — ABNORMAL HIGH
Herpes Simplex Vrs I&II-IgM Ab (EIA): 0.71 INDEX

## 2010-05-18 LAB — PATHOLOGIST SMEAR REVIEW

## 2010-05-18 LAB — HEPARIN LEVEL (UNFRACTIONATED)
Heparin Unfractionated: 0.12 IU/mL — ABNORMAL LOW (ref 0.30–0.70)
Heparin Unfractionated: 0.28 IU/mL — ABNORMAL LOW (ref 0.30–0.70)
Heparin Unfractionated: 0.38 IU/mL (ref 0.30–0.70)
Heparin Unfractionated: 0.43 IU/mL (ref 0.30–0.70)
Heparin Unfractionated: 0.48 IU/mL (ref 0.30–0.70)
Heparin Unfractionated: 0.64 IU/mL (ref 0.30–0.70)
Heparin Unfractionated: <0.1 IU/mL — ABNORMAL LOW (ref 0.30–0.70)

## 2010-05-18 LAB — BRAIN NATRIURETIC PEPTIDE: Pro B Natriuretic peptide (BNP): 174 pg/mL — ABNORMAL HIGH (ref 0.0–100.0)

## 2010-05-18 LAB — TYPE AND SCREEN
Antibody Screen: NEGATIVE
Antibody Screen: NEGATIVE

## 2010-05-18 LAB — CULTURE, ROUTINE-ABSCESS

## 2010-05-18 LAB — STREP PNEUMONIAE URINARY ANTIGEN: Strep Pneumo Urinary Antigen: NEGATIVE

## 2010-05-18 LAB — URINE CULTURE
Colony Count: NO GROWTH
Culture: NO GROWTH

## 2010-05-18 LAB — PROTIME-INR
INR: 1.1 (ref 0.00–1.49)
INR: 1.1 (ref 0.00–1.49)
INR: 1.4 (ref 0.00–1.49)
INR: 1.5 (ref 0.00–1.49)
INR: 1.7 — ABNORMAL HIGH (ref 0.00–1.49)
Prothrombin Time: 14.3 seconds (ref 11.6–15.2)
Prothrombin Time: 14.4 seconds (ref 11.6–15.2)
Prothrombin Time: 14.8 seconds (ref 11.6–15.2)
Prothrombin Time: 15.2 seconds (ref 11.6–15.2)
Prothrombin Time: 17 seconds — ABNORMAL HIGH (ref 11.6–15.2)
Prothrombin Time: 19.4 seconds — ABNORMAL HIGH (ref 11.6–15.2)
Prothrombin Time: 25.4 seconds — ABNORMAL HIGH (ref 11.6–15.2)

## 2010-05-18 LAB — APTT: aPTT: 35 seconds (ref 24–37)

## 2010-05-18 LAB — MAGNESIUM
Magnesium: 2.3 mg/dL (ref 1.5–2.5)
Magnesium: 1.7 mg/dL (ref 1.5–2.5)
Magnesium: 1.9 mg/dL (ref 1.5–2.5)

## 2010-05-18 LAB — HSV PCR: HSV 2 , PCR: NOT DETECTED

## 2010-05-18 LAB — CARBOXYHEMOGLOBIN
Methemoglobin: 0.6 % (ref 0.0–1.5)
O2 Saturation: 77 %
Total hemoglobin: 7.3 g/dL — CL (ref 12.5–16.0)

## 2010-05-18 LAB — PHOSPHORUS
Phosphorus: 1.9 mg/dL — ABNORMAL LOW (ref 2.3–4.6)
Phosphorus: 2.1 mg/dL — ABNORMAL LOW (ref 2.3–4.6)
Phosphorus: 2.8 mg/dL (ref 2.3–4.6)
Phosphorus: 2.9 mg/dL (ref 2.3–4.6)

## 2010-05-18 LAB — PREPARE RBC (CROSSMATCH)

## 2010-05-18 LAB — URINE MICROSCOPIC-ADD ON

## 2010-05-18 LAB — CREATININE, URINE, RANDOM: Creatinine, Urine: 77.6 mg/dL

## 2010-05-18 LAB — LACTIC ACID, PLASMA: Lactic Acid, Venous: 2.5 mmol/L — ABNORMAL HIGH (ref 0.5–2.2)

## 2010-05-18 LAB — VANCOMYCIN, TROUGH: Vancomycin Tr: 17.5 ug/mL (ref 10.0–20.0)

## 2010-05-18 LAB — ABO/RH: ABO/RH(D): O POS

## 2010-05-19 LAB — DIFFERENTIAL
Basophils Absolute: 0 10*3/uL (ref 0.0–0.1)
Basophils Absolute: 0 10*3/uL (ref 0.0–0.1)
Eosinophils Absolute: 0 10*3/uL (ref 0.0–0.7)
Eosinophils Relative: 0 % (ref 0–5)
Eosinophils Relative: 0 % (ref 0–5)
Lymphocytes Relative: 15 % (ref 12–46)
Lymphocytes Relative: 18 % (ref 12–46)
Lymphs Abs: 0.5 10*3/uL — ABNORMAL LOW (ref 0.7–4.0)
Lymphs Abs: 0.9 10*3/uL (ref 0.7–4.0)
Monocytes Absolute: 0.6 10*3/uL (ref 0.1–1.0)
Monocytes Absolute: 0.9 10*3/uL (ref 0.1–1.0)
Monocytes Relative: 11 % (ref 3–12)
Neutro Abs: 3.7 10*3/uL (ref 1.7–7.7)
Neutrophils Relative %: 81 % — ABNORMAL HIGH (ref 43–77)
Neutrophils Relative %: 70 % (ref 43–77)

## 2010-05-19 LAB — CBC
HCT: 29.4 % — ABNORMAL LOW (ref 36.0–46.0)
HCT: 30.6 % — ABNORMAL LOW (ref 36.0–46.0)
Hemoglobin: 10.4 g/dL — ABNORMAL LOW (ref 12.0–15.0)
MCHC: 33.9 g/dL (ref 30.0–36.0)
MCV: 81.2 fL (ref 78.0–100.0)
MCV: 81.6 fL (ref 78.0–100.0)
MCV: 81.1 fL (ref 78.0–100.0)
Platelets: 192 10*3/uL (ref 150–400)
Platelets: 213 10*3/uL (ref 150–400)
RBC: 3.76 MIL/uL — ABNORMAL LOW (ref 3.87–5.11)
RDW: 21.1 % — ABNORMAL HIGH (ref 11.5–15.5)
RDW: 16.9 % — ABNORMAL HIGH (ref 11.5–15.5)
WBC: 5.1 10*3/uL (ref 4.0–10.5)
WBC: 5.8 10*3/uL (ref 4.0–10.5)

## 2010-05-19 LAB — COMPREHENSIVE METABOLIC PANEL
ALT: 13 U/L (ref 0–35)
AST: 20 U/L (ref 0–37)
Albumin: 3.5 g/dL (ref 3.5–5.2)
Alkaline Phosphatase: 72 U/L (ref 39–117)
CO2: 28 mEq/L (ref 19–32)
Chloride: 108 mEq/L (ref 96–112)
GFR calc Af Amer: >60 mL/min (ref >60)
GFR calc non Af Amer: >60 mL/min (ref >60)
Potassium: 3.9 mEq/L (ref 3.5–5.1)
Sodium: 139 mEq/L (ref 135–145)
Total Bilirubin: 0.4 mg/dL (ref 0.3–1.2)

## 2010-05-19 LAB — URINALYSIS, MICROSCOPIC ONLY
Bilirubin Urine: NEGATIVE
Ketones, ur: NEGATIVE mg/dL
Protein, ur: NEGATIVE mg/dL
Specific Gravity, Urine: 1.02 (ref 1.005–1.030)
Urobilinogen, UA: 0.2 mg/dL (ref 0.0–1.0)

## 2010-05-19 LAB — URINE CULTURE
Colony Count: 40000
Special Requests: POSITIVE

## 2010-05-19 LAB — URINALYSIS, ROUTINE W REFLEX MICROSCOPIC
Leukocytes, UA: NEGATIVE
Nitrite: NEGATIVE
Protein, ur: NEGATIVE mg/dL

## 2010-05-19 LAB — URINE MICROSCOPIC-ADD ON

## 2010-05-20 LAB — DIFFERENTIAL
Eosinophils Absolute: 0 10*3/uL (ref 0.0–0.7)
Lymphs Abs: 0.9 10*3/uL (ref 0.7–4.0)
Monocytes Relative: 8 % (ref 3–12)
Neutrophils Relative %: 79 % — ABNORMAL HIGH (ref 43–77)

## 2010-05-20 LAB — URINE CULTURE
Colony Count: NO GROWTH
Special Requests: POSITIVE

## 2010-05-20 LAB — URINALYSIS, MICROSCOPIC ONLY
Glucose, UA: NEGATIVE mg/dL
Protein, ur: NEGATIVE mg/dL
Specific Gravity, Urine: 1.02 (ref 1.005–1.030)

## 2010-05-20 LAB — CBC
MCV: 80.5 fL (ref 78.0–100.0)
RBC: 3.83 MIL/uL — ABNORMAL LOW (ref 3.87–5.11)
WBC: 7 10*3/uL (ref 4.0–10.5)

## 2010-05-21 LAB — COMPREHENSIVE METABOLIC PANEL
Albumin: 3.3 g/dL — ABNORMAL LOW (ref 3.5–5.2)
BUN: 10 mg/dL (ref 6–23)
Creatinine, Ser: 0.74 mg/dL (ref 0.4–1.2)
Total Bilirubin: 0.3 mg/dL (ref 0.3–1.2)
Total Protein: 8.1 g/dL (ref 6.0–8.3)

## 2010-05-21 LAB — DIFFERENTIAL
Basophils Absolute: 0 10*3/uL (ref 0.0–0.1)
Lymphocytes Relative: 21 % (ref 12–46)
Monocytes Absolute: 0.8 10*3/uL (ref 0.1–1.0)
Monocytes Relative: 10 % (ref 3–12)
Neutro Abs: 5.5 10*3/uL (ref 1.7–7.7)

## 2010-05-21 LAB — CBC
HCT: 34.4 % — ABNORMAL LOW (ref 36.0–46.0)
MCV: 81.2 fL (ref 78.0–100.0)
Platelets: 364 10*3/uL (ref 150–400)
RDW: 15 % (ref 11.5–15.5)

## 2010-05-21 LAB — APTT: aPTT: 35 seconds (ref 24–37)

## 2010-05-21 LAB — PROTIME-INR: INR: 1.1 (ref 0.00–1.49)

## 2010-05-31 ENCOUNTER — Encounter (HOSPITAL_COMMUNITY): Payer: Self-pay | Attending: Oncology

## 2010-05-31 DIAGNOSIS — C50919 Malignant neoplasm of unspecified site of unspecified female breast: Secondary | ICD-10-CM

## 2010-05-31 DIAGNOSIS — Z853 Personal history of malignant neoplasm of breast: Secondary | ICD-10-CM | POA: Insufficient documentation

## 2010-05-31 DIAGNOSIS — Z86718 Personal history of other venous thrombosis and embolism: Secondary | ICD-10-CM | POA: Insufficient documentation

## 2010-06-01 ENCOUNTER — Encounter (HOSPITAL_COMMUNITY): Payer: Self-pay

## 2010-06-26 NOTE — Op Note (Signed)
[name], Wendy Gilbert                  ACCOUNT NO.:  [id number]   MEDICAL RECORD NO.:  [id number]          PATIENT TYPE:  AMB   LOCATION:  DAY                           FACILITY:  [facility]   PHYSICIAN:  Dalia Heading, M.D.  DATE OF BIRTH:  02/03/1961   DATE OF PROCEDURE:  08/09/2008  DATE OF DISCHARGE:  08/09/2008                               OPERATIVE REPORT   PREOPERATIVE DIAGNOSIS:  Right breast carcinoma, inflammatory.   POSTOPERATIVE DIAGNOSIS:  Right breast carcinoma, inflammatory.   PROCEDURE:  Port-A-Cath insertion.   SURGEON:  Dalia Heading, MD   ANESTHESIA:  MAC.   INDICATIONS:  The patient is a 50 year old, white female, who is about  to undergo chemotherapy for inflammatory breast carcinoma on the right  side.  The risks and benefits of the procedure including bleeding,  infection, pneumothorax were fully explained to the patient, gave  informed consent.   PROCEDURE NOTE:  The patient was placed in the Trendelenburg position  after left upper chest was prepped and draped using the usual sterile  technique with DuraPrep.  Surgical site confirmation was performed.  A  1% Xylocaine was used for local anesthesia.   A transverse incision was made below the left clavicle.  Subcutaneous  pocket was then formed.  Needle was advanced into the left subclavian  vein using the Seldinger technique without difficulty.  Guidewire was  then advanced into the right atrium under fluoroscopic guidance.  An  introducer and peel-away sheath were placed over the guidewire.  The  catheter was then inserted through the peel-away sheath and the peel-  away sheath was removed.  The catheter was then attached to the port and  the port placed in subcutaneous pocket.  Adequate positioning was  confirmed by fluoroscopy.  The port was flushed with 3000 units of  heparin.  The subcutaneous layer was reapproximated using a 3-0 Vicryl  interrupted suture.  The skin was closed using a 4-0 Vicryl  subcuticular  suture.  Dermabond was then applied.   The right breast lesion was noted to be fungating in nature and Xeroform  was applied.  All tape and needle counts correct at the end of the  procedure.  The patient was awakened and transferred to PACU in stable  condition.   COMPLICATIONS:  None.   SPECIMEN:  None.   BLOOD LOSS:  Minimal.      Dalia Heading, M.D.  Electronically Signed     MAJ/MEDQ  D:  08/09/2008  T:  08/10/2008  Job:  [id number]   cc:   Health Department [facility]  Fax: [phone]   Ladona Horns. Mariel Sleet, MD  Fax: [phone]

## 2010-06-26 NOTE — H&P (Signed)
NAME:  Wendy Gilbert, Wendy Gilbert                  ACCOUNT NO.:  [id number]   MEDICAL RECORD NO.:  [id number]          PATIENT TYPE:  AMB   LOCATION:  DAY                           FACILITY:  [facility]   PHYSICIAN:  Dalia Heading, M.D.  DATE OF BIRTH:  24-Jul-1960   DATE OF ADMISSION:  DATE OF DISCHARGE:  LH                              HISTORY & PHYSICAL   CHIEF COMPLAINT:  Right breast carcinoma, need for central venous  access.   HISTORY OF PRESENT ILLNESS:  The patient is a 50 year old white female,  who is referred for evaluation and treatment of a right breast  carcinoma.  This was found recently on biopsy of the breast and is  positive for carcinoma.  She is about to undergo chemotherapy  preoperatively to shrink the tumor.  The tumor appears to be a stage III  right breast cancer.   PAST MEDICAL HISTORY:  Unremarkable.   PAST SURGICAL HISTORY:  Knee surgery, C-sections.   CURRENT MEDICATIONS:  Prilosec.   ALLERGIES:  PENICILLIN, ERYTHROMYCIN, DEMEROL.   REVIEW OF SYSTEMS:  Noncontributory.   PHYSICAL EXAMINATION:  GENERAL:  The patient is a well-developed, well-  nourished, white female, in no acute distress.  LUNGS:  Clear to auscultation with equal breath sounds bilaterally.  HEART:  Regular rate and rhythm without S3, S4, murmurs.   IMPRESSION:  Right breast carcinoma, stage III.   PLAN:  The patient was scheduled for Port-A-Cath insertion on August 09, 2008.  The risks and benefits of the procedure including bleeding,  infection, and pneumothorax were fully explained to the patient, gave  informed consent.       Dalia Heading, M.D.  Electronically Signed     MAJ/MEDQ  D:  08/04/2008  T:  08/05/2008  Job:  [id number]   cc:   Ladona Horns. Mariel Sleet, MD  Fax: [phone]   Health Department [facility]  Fax: [phone]

## 2010-06-29 ENCOUNTER — Encounter (HOSPITAL_COMMUNITY): Payer: Self-pay | Attending: Oncology

## 2010-06-29 ENCOUNTER — Other Ambulatory Visit (HOSPITAL_COMMUNITY): Payer: Self-pay | Admitting: Oncology

## 2010-06-29 DIAGNOSIS — C50919 Malignant neoplasm of unspecified site of unspecified female breast: Secondary | ICD-10-CM

## 2010-06-29 DIAGNOSIS — Z86718 Personal history of other venous thrombosis and embolism: Secondary | ICD-10-CM | POA: Insufficient documentation

## 2010-06-29 DIAGNOSIS — Z853 Personal history of malignant neoplasm of breast: Secondary | ICD-10-CM | POA: Insufficient documentation

## 2010-06-29 LAB — PROTIME-INR: Prothrombin Time: 25.5 seconds — ABNORMAL HIGH (ref 11.6–15.2)

## 2010-06-29 NOTE — Discharge Summary (Signed)
[facility]  Patient:    Wendy Gilbert, Wendy Gilbert                           MRN: [id number] Adm. Date:  [id number] Disc. Date: [id number] Attending:  Leonard Schwartz                           Discharge Summary  DATE OF BIRTH:                1961-01-15  ADMISSION DIAGNOSES:          Intrauterine pregnancy at 62 weeks, multiparous, proteinuria, history of herpes simplex virus, breech positioned baby, and preeclampsia desiring permanent sterilization.  DISCHARGE DIAGNOSES:          Intrauterine pregnancy at 38 weeks, multiparous, proteinuria, history of herpes simplex virus, breech positioned baby, and preeclampsia desiring permanent sterilization, delivered by primary lower segment transverse cesarean section, viable baby girl weighing 6 pounds 6 ounces, Apgars 8 and 9.  Recovered satisfactorily from tubal ligation and resolving preeclampsia.  PROCEDURE:                    Primary lower segment transverse cesarean section and bilateral tubal ligation.  ANESTHESIA:                   Spinal.  Adult intensive care unit monitoring. Magnesium sulfate therapy.  COURSE OF HOSPITALIZATION:    On August 15 50 year old gravida 3, para 1-0-1-1 [name] at 38 weeks presented with 2+ proteinuria in a catheter sample with a diastolic blood pressure of 90.  She had a history of HSV, but no prodromal symptoms currently.  Her baby by ultrasound is found to be breech with normal fluid.  The patient was offered an external version but she declined and wants to have a cesarean section and tubal ligation.  Risks and benefits were reviewed and she proceeded to have an uncomplicated primary cesarean section and delivered a viable baby girl weighing 6 pounds 6 ounces named Cassandra, Apgars 8 and 9.  She also had bilateral tubal ligation at her request without complications.  On the August 16 the very next morning after her surgery, her intake and output was 1000/9/700.   Blood pressures ranged 115-148/60-78.  O2 saturation was 91-100%.  She was receiving magnesium sulfate therapy.  Her liver enzymes:  SGOT 24, SGPT 17, magnesium 3.8, uric acid 5.4, LDH 217. Hemoglobin had dropped to 10.9, hematocrit 32.1, platelets 264, white count 16.1.  She recovered satisfactorily on her first day.  On postoperative day #2 on the seventeenth she denied any swelling, headaches, or other PIH symptoms. Her blood pressures ranged 120-140/60-70.  Abdomen was soft.  Incision clean, dry, and intact.  Her urine output was greater than 100 cc/hour.  It appeared that her preeclampsia was improving.  Magnesium sulfate was discontinued and she was transferred to the postpartum unit.  On postoperative day #3, August 18, she continued to recover satisfactorily.  Incision was clean, dry, and intact.  She was passing flatus, tolerating a regular diet well, breast-feeding satisfactorily.  Her incision was clean, dry, and intact.  She had trace edema.  Urine output had been 650 cc over the preceding night.  She had been voiding well.  Blood pressures ranged 110-134/70-80.  After deeming to have received the full benefit of her hospitalization, she was discharged home in stable condition  after removal of staples and application of Steri-Strips.  DISCHARGE INSTRUCTIONS:       PIH precautions and [facility] booklet.  DISCHARGE MEDICATIONS:        Motrin, Tylox, prenatal vitamins, over-the-counter iron.  DISCHARGE FOLLOW-UP:          In six weeks at [facility]. DD:  09/29/99 TD:  10/01/99 Job: [id number]

## 2010-07-27 ENCOUNTER — Encounter (HOSPITAL_COMMUNITY): Payer: Self-pay | Attending: Oncology

## 2010-07-27 ENCOUNTER — Other Ambulatory Visit (HOSPITAL_COMMUNITY): Payer: Self-pay | Admitting: Oncology

## 2010-07-27 DIAGNOSIS — C50919 Malignant neoplasm of unspecified site of unspecified female breast: Secondary | ICD-10-CM

## 2010-07-27 DIAGNOSIS — Z853 Personal history of malignant neoplasm of breast: Secondary | ICD-10-CM | POA: Insufficient documentation

## 2010-07-27 DIAGNOSIS — Z86718 Personal history of other venous thrombosis and embolism: Secondary | ICD-10-CM | POA: Insufficient documentation

## 2010-07-27 DIAGNOSIS — I82409 Acute embolism and thrombosis of unspecified deep veins of unspecified lower extremity: Secondary | ICD-10-CM

## 2010-07-27 LAB — PROTIME-INR: INR: 1.96 — ABNORMAL HIGH (ref 0.00–1.49)

## 2010-07-31 ENCOUNTER — Encounter (HOSPITAL_COMMUNITY): Payer: Self-pay | Admitting: *Deleted

## 2010-08-01 ENCOUNTER — Encounter (HOSPITAL_COMMUNITY): Payer: Self-pay | Admitting: Oncology

## 2010-08-01 ENCOUNTER — Other Ambulatory Visit (HOSPITAL_COMMUNITY): Payer: Self-pay | Admitting: Oncology

## 2010-08-01 DIAGNOSIS — C50919 Malignant neoplasm of unspecified site of unspecified female breast: Secondary | ICD-10-CM | POA: Insufficient documentation

## 2010-08-03 ENCOUNTER — Encounter (HOSPITAL_COMMUNITY): Payer: Self-pay

## 2010-08-03 ENCOUNTER — Other Ambulatory Visit (HOSPITAL_COMMUNITY): Payer: Self-pay | Admitting: Oncology

## 2010-08-03 DIAGNOSIS — C50919 Malignant neoplasm of unspecified site of unspecified female breast: Secondary | ICD-10-CM

## 2010-08-03 DIAGNOSIS — I82409 Acute embolism and thrombosis of unspecified deep veins of unspecified lower extremity: Secondary | ICD-10-CM

## 2010-08-17 ENCOUNTER — Other Ambulatory Visit (HOSPITAL_COMMUNITY): Payer: Self-pay | Admitting: Oncology

## 2010-08-17 ENCOUNTER — Encounter (HOSPITAL_COMMUNITY): Payer: Self-pay | Attending: Oncology

## 2010-08-17 DIAGNOSIS — I82409 Acute embolism and thrombosis of unspecified deep veins of unspecified lower extremity: Secondary | ICD-10-CM | POA: Insufficient documentation

## 2010-08-17 DIAGNOSIS — C50919 Malignant neoplasm of unspecified site of unspecified female breast: Secondary | ICD-10-CM | POA: Insufficient documentation

## 2010-08-17 LAB — CBC
HCT: 37.8 % (ref 36.0–46.0)
Hemoglobin: 12.2 g/dL (ref 12.0–15.0)
MCV: 83.6 fL (ref 78.0–100.0)
RBC: 4.52 MIL/uL (ref 3.87–5.11)
RDW: 14.6 % (ref 11.5–15.5)
WBC: 4.7 10*3/uL (ref 4.0–10.5)

## 2010-08-17 LAB — PROTIME-INR: INR: 2.49 — ABNORMAL HIGH (ref 0.00–1.49)

## 2010-08-17 LAB — COMPREHENSIVE METABOLIC PANEL
AST: 28 U/L (ref 0–37)
Albumin: 3.6 g/dL (ref 3.5–5.2)
Calcium: 9.2 mg/dL (ref 8.4–10.5)
Chloride: 105 mEq/L (ref 96–112)
Creatinine, Ser: 0.59 mg/dL (ref 0.50–1.10)
Sodium: 140 mEq/L (ref 135–145)
Total Bilirubin: 0.2 mg/dL — ABNORMAL LOW (ref 0.3–1.2)

## 2010-08-17 LAB — DIFFERENTIAL
Eosinophils Relative: 11 % — ABNORMAL HIGH (ref 0–5)
Lymphocytes Relative: 33 % (ref 12–46)
Lymphs Abs: 1.6 10*3/uL (ref 0.7–4.0)
Neutro Abs: 2.2 10*3/uL (ref 1.7–7.7)

## 2010-08-24 ENCOUNTER — Other Ambulatory Visit (HOSPITAL_COMMUNITY): Payer: Self-pay

## 2010-08-27 ENCOUNTER — Encounter (HOSPITAL_COMMUNITY): Payer: Self-pay | Admitting: Oncology

## 2010-08-27 ENCOUNTER — Encounter (HOSPITAL_BASED_OUTPATIENT_CLINIC_OR_DEPARTMENT_OTHER): Payer: Self-pay | Admitting: Oncology

## 2010-08-27 VITALS — BP 144/88 | HR 87 | Wt 205.0 lb

## 2010-08-27 DIAGNOSIS — I82409 Acute embolism and thrombosis of unspecified deep veins of unspecified lower extremity: Secondary | ICD-10-CM

## 2010-08-27 DIAGNOSIS — C50919 Malignant neoplasm of unspecified site of unspecified female breast: Secondary | ICD-10-CM

## 2010-08-27 NOTE — Progress Notes (Signed)
This office note has been dictated.

## 2010-08-27 NOTE — Patient Instructions (Signed)
[facility]  Discharge Instructions  RECOMMENDATIONS MADE BY THE CONSULTANT AND ANY TEST RESULTS WILL BE SENT TO YOUR REFERRING DOCTOR.   EXAM FINDINGS BY MD TODAY AND SIGNS AND SYMPTOMS TO REPORT TO CLINIC OR PRIMARY MD:  Exam good  MEDICATIONS PRESCRIBED: none  INSTRUCTIONS GIVEN AND DISCUSSED: Other  none  SPECIAL INSTRUCTIONS/FOLLOW-UP: Lab work Needed  In one month pt level Other labs in 6 months   I acknowledge that I have been informed and understand all the instructions given to me and received a copy. I do not have any more questions at this time, but understand that I may call the Specialty Clinic at [facility] at [phone] during business hours should I have any further questions or need assistance in obtaining follow-up care.    __________________________________________  _____________  __________ Signature of Patient or Authorized Representative            Date                   Time    __________________________________________ Nurse's Signature

## 2010-09-14 ENCOUNTER — Encounter (HOSPITAL_COMMUNITY): Payer: Self-pay | Attending: Oncology

## 2010-09-14 ENCOUNTER — Other Ambulatory Visit (HOSPITAL_COMMUNITY): Payer: Self-pay | Admitting: Oncology

## 2010-09-14 ENCOUNTER — Encounter (HOSPITAL_COMMUNITY): Payer: Self-pay | Admitting: Oncology

## 2010-09-14 ENCOUNTER — Telehealth (HOSPITAL_COMMUNITY): Payer: Self-pay | Admitting: *Deleted

## 2010-09-14 DIAGNOSIS — Z452 Encounter for adjustment and management of vascular access device: Secondary | ICD-10-CM

## 2010-09-14 DIAGNOSIS — I82409 Acute embolism and thrombosis of unspecified deep veins of unspecified lower extremity: Secondary | ICD-10-CM | POA: Insufficient documentation

## 2010-09-14 DIAGNOSIS — C50919 Malignant neoplasm of unspecified site of unspecified female breast: Secondary | ICD-10-CM | POA: Insufficient documentation

## 2010-09-14 LAB — PROTIME-INR: INR: 2.79 — ABNORMAL HIGH (ref 0.00–1.49)

## 2010-09-14 MED ORDER — SODIUM CHLORIDE 0.9 % IJ SOLN
INTRAMUSCULAR | Status: AC
  Administered 2010-09-14: 10 mL via INTRAVENOUS
  Filled 2010-09-14: qty 20

## 2010-09-14 MED ORDER — HEPARIN SOD (PORK) LOCK FLUSH 100 UNIT/ML IV SOLN
500.0000 [IU] | Freq: Once | INTRAVENOUS | Status: AC
  Administered 2010-09-14: 500 [IU] via INTRAVENOUS

## 2010-09-14 MED ORDER — HEPARIN SOD (PORK) LOCK FLUSH 100 UNIT/ML IV SOLN
INTRAVENOUS | Status: AC
  Administered 2010-09-14: 500 [IU] via INTRAVENOUS
  Filled 2010-09-14: qty 5

## 2010-09-14 MED ORDER — SODIUM CHLORIDE 0.9 % IJ SOLN
10.0000 mL | Freq: Once | INTRAMUSCULAR | Status: AC
  Administered 2010-09-14: 10 mL via INTRAVENOUS

## 2010-09-14 MED ORDER — SODIUM CHLORIDE 0.9 % IJ SOLN
INTRAMUSCULAR | Status: AC
  Filled 2010-09-14: qty 10

## 2010-09-14 NOTE — Telephone Encounter (Signed)
Message left for pt to continue same dose coumadin and pt level in one month.

## 2010-09-14 NOTE — Progress Notes (Signed)
Mickel Duhamel Deeney presented for Portacath access and flush. Proper placement of portacath confirmed by CXR. Portacath located left chest wall accessed with  H 20 needle. Sluggish blood return and flushed with 3 10 cc saline syringes. Specimen obtained for pt level. Portacath flushed with 20ml NS and 500U/19ml Heparin and needle removed intact. Procedure without incident. Patient tolerated procedure well.

## 2010-10-17 ENCOUNTER — Other Ambulatory Visit (HOSPITAL_COMMUNITY): Payer: Self-pay | Admitting: Oncology

## 2010-10-17 ENCOUNTER — Encounter (HOSPITAL_COMMUNITY): Payer: Self-pay | Attending: Oncology

## 2010-10-17 DIAGNOSIS — I82409 Acute embolism and thrombosis of unspecified deep veins of unspecified lower extremity: Secondary | ICD-10-CM | POA: Insufficient documentation

## 2010-10-17 DIAGNOSIS — I82629 Acute embolism and thrombosis of deep veins of unspecified upper extremity: Secondary | ICD-10-CM

## 2010-10-17 LAB — PROTIME-INR: Prothrombin Time: 23.2 seconds — ABNORMAL HIGH (ref 11.6–15.2)

## 2010-10-17 MED ORDER — SODIUM CHLORIDE 0.9 % IJ SOLN
INTRAMUSCULAR | Status: AC
  Administered 2010-10-17: 10 mL
  Filled 2010-10-17: qty 10

## 2010-10-17 MED ORDER — HEPARIN SOD (PORK) LOCK FLUSH 100 UNIT/ML IV SOLN
INTRAVENOUS | Status: AC
  Administered 2010-10-17: 500 [IU]
  Filled 2010-10-17: qty 5

## 2010-10-17 NOTE — Progress Notes (Unsigned)
Mickel Duhamel [place] presented for Portacath access and flush. Proper placement of portacath confirmed by CXR. Portacath located LT chest wall accessed with  H 20 needle. Good blood return present. Portacath flushed with 20ml NS and 500U/52ml Heparin and needle removed intact. Procedure without incident. Patient tolerated procedure well.  PT/INR drawn.

## 2010-11-14 ENCOUNTER — Telehealth (HOSPITAL_COMMUNITY): Payer: Self-pay

## 2010-11-14 ENCOUNTER — Encounter (HOSPITAL_COMMUNITY): Payer: Self-pay | Attending: Oncology

## 2010-11-14 ENCOUNTER — Other Ambulatory Visit (HOSPITAL_COMMUNITY): Payer: Self-pay | Admitting: Oncology

## 2010-11-14 DIAGNOSIS — I82409 Acute embolism and thrombosis of unspecified deep veins of unspecified lower extremity: Secondary | ICD-10-CM

## 2010-11-14 MED ORDER — SODIUM CHLORIDE 0.9 % IJ SOLN
10.0000 mL | INTRAMUSCULAR | Status: DC | PRN
  Administered 2010-11-14: 10 mL via INTRAVENOUS
  Filled 2010-11-14: qty 10

## 2010-11-14 MED ORDER — SODIUM CHLORIDE 0.9 % IJ SOLN
INTRAMUSCULAR | Status: AC
  Administered 2010-11-14: 10 mL via INTRAVENOUS
  Filled 2010-11-14: qty 10

## 2010-11-14 MED ORDER — HEPARIN SOD (PORK) LOCK FLUSH 100 UNIT/ML IV SOLN
INTRAVENOUS | Status: AC
  Administered 2010-11-14: 500 [IU] via INTRAVENOUS
  Filled 2010-11-14: qty 5

## 2010-11-14 MED ORDER — HEPARIN SOD (PORK) LOCK FLUSH 100 UNIT/ML IV SOLN
500.0000 [IU] | Freq: Once | INTRAVENOUS | Status: AC
  Administered 2010-11-14: 500 [IU] via INTRAVENOUS
  Filled 2010-11-14: qty 5

## 2010-11-14 NOTE — Telephone Encounter (Signed)
Patient called to inform of lab results and to continue current dose of Coumadin.  Next lab appointment also given.

## 2010-11-14 NOTE — Progress Notes (Signed)
Wendy Gilbert presented for Portacath access and flush. Proper placement of portacath confirmed by CXR. Portacath located left chest wall accessed with  H 20 needle. Good blood return present. PT/INR drawm. Portacath flushed with 20ml NS and 500U/75ml Heparin and needle removed intact. Procedure without incident. Patient tolerated procedure well.

## 2010-11-20 ENCOUNTER — Encounter (INDEPENDENT_AMBULATORY_CARE_PROVIDER_SITE_OTHER): Payer: Self-pay | Admitting: General Surgery

## 2010-12-11 ENCOUNTER — Encounter (INDEPENDENT_AMBULATORY_CARE_PROVIDER_SITE_OTHER): Payer: Self-pay | Admitting: General Surgery

## 2010-12-11 ENCOUNTER — Ambulatory Visit (INDEPENDENT_AMBULATORY_CARE_PROVIDER_SITE_OTHER): Payer: Self-pay | Admitting: General Surgery

## 2010-12-11 VITALS — BP 118/82 | HR 64 | Temp 97.7°F | Resp 14 | Ht 63.5 in | Wt 203.8 lb

## 2010-12-11 DIAGNOSIS — C773 Secondary and unspecified malignant neoplasm of axilla and upper limb lymph nodes: Secondary | ICD-10-CM

## 2010-12-11 DIAGNOSIS — C50919 Malignant neoplasm of unspecified site of unspecified female breast: Secondary | ICD-10-CM

## 2010-12-11 NOTE — Progress Notes (Signed)
Operation: Right MRM and Left SM for inflammatory right breast CA  Date:02/2009  Stage: T4N3M0  Hormone receptor status: neg.  HPI:  Wendy Gilbert is here for followup of her right breast cancer.  She is feeling well.  She denies any chest wall nodules.  She denies any adenopathy.  She is still on Coumadin.  PE:Gen- WDWN in NAD.  Chest:  Bilateral chest wall scars with no palpable nodules; right chest wall with radiation changes to the skin; Portacath in left chest wall.  Lymph nodes:  No palpable cervical, supraclavicular or axillary adenopathy.  Assessment:  Right Breast cancer with no clinical evidence of recurrence.   Plan:  Return visit in 4-5 months.

## 2010-12-17 ENCOUNTER — Other Ambulatory Visit (HOSPITAL_COMMUNITY): Payer: Self-pay

## 2010-12-19 ENCOUNTER — Telehealth (HOSPITAL_COMMUNITY): Payer: Self-pay

## 2010-12-19 ENCOUNTER — Other Ambulatory Visit (HOSPITAL_COMMUNITY): Payer: Self-pay | Admitting: Oncology

## 2010-12-19 ENCOUNTER — Encounter (HOSPITAL_COMMUNITY): Payer: Self-pay | Attending: Oncology

## 2010-12-19 DIAGNOSIS — I82409 Acute embolism and thrombosis of unspecified deep veins of unspecified lower extremity: Secondary | ICD-10-CM

## 2010-12-19 LAB — PROTIME-INR: Prothrombin Time: 26.4 seconds — ABNORMAL HIGH (ref 11.6–15.2)

## 2010-12-19 MED ORDER — SODIUM CHLORIDE 0.9 % IJ SOLN
10.0000 mL | INTRAMUSCULAR | Status: DC | PRN
  Administered 2010-12-19: 10 mL via INTRAVENOUS
  Filled 2010-12-19: qty 10

## 2010-12-19 MED ORDER — HEPARIN SOD (PORK) LOCK FLUSH 100 UNIT/ML IV SOLN
500.0000 [IU] | Freq: Once | INTRAVENOUS | Status: AC
  Administered 2010-12-19: 500 [IU] via INTRAVENOUS
  Filled 2010-12-19: qty 5

## 2010-12-19 NOTE — Telephone Encounter (Signed)
error 

## 2010-12-19 NOTE — Telephone Encounter (Signed)
Instructed to continue coumadin (7 mg/ 7 mg / 6 mg/ 7 mg /7 mg/ 6 mg,) etc. at same dosage and to return in 1 month for next PT level 01/16/11.

## 2010-12-19 NOTE — Progress Notes (Signed)
Wendy Gilbert presented for Portacath access and flush. Proper placement of portacath confirmed by CXR. Portacath located lt chest wall accessed with  H 20 needle. Good blood return present. PT INR drawn from port after 20cc waste. Portacath flushed with 20ml NS and 500U/36ml Heparin and needle removed intact. Procedure without incident. Patient tolerated procedure well.

## 2011-01-16 ENCOUNTER — Other Ambulatory Visit (HOSPITAL_COMMUNITY): Payer: Self-pay | Admitting: Oncology

## 2011-01-16 ENCOUNTER — Encounter (HOSPITAL_COMMUNITY): Payer: Medicare Other | Attending: Oncology

## 2011-01-16 DIAGNOSIS — I82409 Acute embolism and thrombosis of unspecified deep veins of unspecified lower extremity: Secondary | ICD-10-CM | POA: Insufficient documentation

## 2011-01-16 DIAGNOSIS — I82629 Acute embolism and thrombosis of deep veins of unspecified upper extremity: Secondary | ICD-10-CM

## 2011-01-16 MED ORDER — HEPARIN SOD (PORK) LOCK FLUSH 100 UNIT/ML IV SOLN
500.0000 [IU] | Freq: Once | INTRAVENOUS | Status: AC
  Administered 2011-01-16: 500 [IU] via INTRAVENOUS
  Filled 2011-01-16: qty 5

## 2011-01-16 MED ORDER — SODIUM CHLORIDE 0.9 % IJ SOLN
10.0000 mL | INTRAMUSCULAR | Status: DC | PRN
  Administered 2011-01-16: 10 mL via INTRAVENOUS
  Filled 2011-01-16: qty 10

## 2011-01-16 MED ORDER — HEPARIN SOD (PORK) LOCK FLUSH 100 UNIT/ML IV SOLN
INTRAVENOUS | Status: AC
  Administered 2011-01-16: 500 [IU] via INTRAVENOUS
  Filled 2011-01-16: qty 5

## 2011-01-16 MED ORDER — SODIUM CHLORIDE 0.9 % IJ SOLN
INTRAMUSCULAR | Status: AC
  Administered 2011-01-16: 10 mL via INTRAVENOUS
  Filled 2011-01-16: qty 10

## 2011-01-16 NOTE — Progress Notes (Signed)
Wendy Gilbert presented for Portacath access and flush. Proper placement of portacath confirmed by CXR. Portacath located lt chest wall accessed with  H 20 needle. Good blood return present.pt-inr drawn. Portacath flushed with 20ml NS and 500U/17ml Heparin and needle removed intact. Procedure without incident. Patient tolerated procedure well.

## 2011-01-23 ENCOUNTER — Other Ambulatory Visit (HOSPITAL_COMMUNITY): Payer: Self-pay | Admitting: Oncology

## 2011-01-23 ENCOUNTER — Encounter (HOSPITAL_BASED_OUTPATIENT_CLINIC_OR_DEPARTMENT_OTHER): Payer: Medicare Other

## 2011-01-23 DIAGNOSIS — I82409 Acute embolism and thrombosis of unspecified deep veins of unspecified lower extremity: Secondary | ICD-10-CM

## 2011-01-23 DIAGNOSIS — C50419 Malignant neoplasm of upper-outer quadrant of unspecified female breast: Secondary | ICD-10-CM

## 2011-01-23 LAB — PROTIME-INR
INR: 2.05 — ABNORMAL HIGH (ref 0.00–1.49)
Prothrombin Time: 23.5 seconds — ABNORMAL HIGH (ref 11.6–15.2)

## 2011-01-23 MED ORDER — SODIUM CHLORIDE 0.9 % IJ SOLN
INTRAMUSCULAR | Status: AC
  Administered 2011-01-23: 10 mL via INTRAVENOUS
  Filled 2011-01-23: qty 20

## 2011-01-23 MED ORDER — SODIUM CHLORIDE 0.9 % IJ SOLN
10.0000 mL | INTRAMUSCULAR | Status: DC | PRN
  Administered 2011-01-23: 10 mL via INTRAVENOUS
  Filled 2011-01-23: qty 10

## 2011-01-23 MED ORDER — HEPARIN SOD (PORK) LOCK FLUSH 100 UNIT/ML IV SOLN
500.0000 [IU] | Freq: Once | INTRAVENOUS | Status: AC
  Administered 2011-01-23: 500 [IU] via INTRAVENOUS
  Filled 2011-01-23: qty 5

## 2011-01-23 MED ORDER — HEPARIN SOD (PORK) LOCK FLUSH 100 UNIT/ML IV SOLN
INTRAVENOUS | Status: AC
  Administered 2011-01-23: 500 [IU] via INTRAVENOUS
  Filled 2011-01-23: qty 5

## 2011-01-23 NOTE — Progress Notes (Signed)
Wendy Gilbert presented for labwork. Labs per MD order drawn via Portacath located in the left chest wall accessed with  H 20 needle. Good blood return present. Procedure without incident.  Portacath flushed with 20ml NS and 500U/56ml Heparin per protocol and needle removed intact. Patient tolerated procedure well.

## 2011-02-06 ENCOUNTER — Encounter (HOSPITAL_BASED_OUTPATIENT_CLINIC_OR_DEPARTMENT_OTHER): Payer: Medicare Other

## 2011-02-06 ENCOUNTER — Other Ambulatory Visit (HOSPITAL_COMMUNITY): Payer: Self-pay | Admitting: *Deleted

## 2011-02-06 DIAGNOSIS — I82409 Acute embolism and thrombosis of unspecified deep veins of unspecified lower extremity: Secondary | ICD-10-CM

## 2011-02-06 LAB — PROTIME-INR: INR: 2.75 — ABNORMAL HIGH (ref 0.00–1.49)

## 2011-02-06 MED ORDER — HEPARIN SOD (PORK) LOCK FLUSH 100 UNIT/ML IV SOLN
500.0000 [IU] | Freq: Once | INTRAVENOUS | Status: DC
  Filled 2011-02-06: qty 5

## 2011-02-06 MED ORDER — SODIUM CHLORIDE 0.9 % IJ SOLN
INTRAMUSCULAR | Status: AC
  Filled 2011-02-06: qty 10

## 2011-02-06 MED ORDER — SODIUM CHLORIDE 0.9 % IJ SOLN
10.0000 mL | INTRAMUSCULAR | Status: DC | PRN
  Filled 2011-02-06: qty 10

## 2011-02-06 MED ORDER — HEPARIN SOD (PORK) LOCK FLUSH 100 UNIT/ML IV SOLN
INTRAVENOUS | Status: AC
  Filled 2011-02-06: qty 5

## 2011-02-06 NOTE — Progress Notes (Signed)
Mickel Duhamel Br[place]ock presented for Portacath access and flush. Proper placement of portacath confirmed by CXR. Portacath located left chest wall accessed with  H 20 needle. Good blood return present. Lab drawn for PT/INR. Portacath flushed with 20ml NS and 500U/24ml Heparin and needle removed intact. Procedure without incident. Patient tolerated procedure well.  Current Coumadin dose 7mg  7mg  6mg  alternating.

## 2011-02-06 NOTE — Progress Notes (Signed)
Coumadin 7/7/6.  INR in 2 weeks. February 20, 2011

## 2011-02-20 ENCOUNTER — Encounter (HOSPITAL_COMMUNITY): Payer: Medicare Other | Attending: Oncology

## 2011-02-20 DIAGNOSIS — I82629 Acute embolism and thrombosis of deep veins of unspecified upper extremity: Secondary | ICD-10-CM

## 2011-02-20 DIAGNOSIS — I82409 Acute embolism and thrombosis of unspecified deep veins of unspecified lower extremity: Secondary | ICD-10-CM | POA: Insufficient documentation

## 2011-02-20 DIAGNOSIS — C50919 Malignant neoplasm of unspecified site of unspecified female breast: Secondary | ICD-10-CM

## 2011-02-20 LAB — DIFFERENTIAL
Basophils Relative: 1 % (ref 0–1)
Eosinophils Relative: 3 % (ref 0–5)
Monocytes Absolute: 0.3 10*3/uL (ref 0.1–1.0)
Monocytes Relative: 7 % (ref 3–12)
Neutro Abs: 2.4 10*3/uL (ref 1.7–7.7)

## 2011-02-20 LAB — PROTIME-INR: INR: 1.92 — ABNORMAL HIGH (ref 0.00–1.49)

## 2011-02-20 LAB — COMPREHENSIVE METABOLIC PANEL
Albumin: 3.5 g/dL (ref 3.5–5.2)
BUN: 13 mg/dL (ref 6–23)
CO2: 30 mEq/L (ref 19–32)
Calcium: 9.5 mg/dL (ref 8.4–10.5)
Chloride: 105 mEq/L (ref 96–112)
Creatinine, Ser: 0.67 mg/dL (ref 0.50–1.10)
GFR calc non Af Amer: >90 mL/min (ref >90)
Total Bilirubin: 0.5 mg/dL (ref 0.3–1.2)

## 2011-02-20 LAB — CBC
HCT: 36.1 % (ref 36.0–46.0)
Hemoglobin: 11.8 g/dL — ABNORMAL LOW (ref 12.0–15.0)
MCHC: 32.7 g/dL (ref 30.0–36.0)
MCV: 83.4 fL (ref 78.0–100.0)

## 2011-02-20 MED ORDER — SODIUM CHLORIDE 0.9 % IJ SOLN
10.0000 mL | INTRAMUSCULAR | Status: DC | PRN
  Administered 2011-02-20: 10 mL via INTRAVENOUS
  Filled 2011-02-20: qty 10

## 2011-02-20 MED ORDER — HEPARIN SOD (PORK) LOCK FLUSH 100 UNIT/ML IV SOLN
500.0000 [IU] | Freq: Once | INTRAVENOUS | Status: AC
  Administered 2011-02-20: 500 [IU] via INTRAVENOUS
  Filled 2011-02-20: qty 5

## 2011-02-20 MED ORDER — SODIUM CHLORIDE 0.9 % IJ SOLN
INTRAMUSCULAR | Status: AC
  Administered 2011-02-20: 10 mL via INTRAVENOUS
  Filled 2011-02-20: qty 10

## 2011-02-20 MED ORDER — HEPARIN SOD (PORK) LOCK FLUSH 100 UNIT/ML IV SOLN
INTRAVENOUS | Status: AC
  Administered 2011-02-20: 500 [IU] via INTRAVENOUS
  Filled 2011-02-20: qty 5

## 2011-02-20 NOTE — Progress Notes (Signed)
Wendy Gilbert presented for Portacath access and flush. Proper placement of portacath confirmed by CXR. Portacath located lt chest wall accessed with  H 20 needle. Good blood return present. Portacath flushed with 20ml NS and 500U/39ml Heparin and needle removed intact. Procedure without incident. Patient tolerated procedure well.  On coumadin 7-7-6

## 2011-02-21 ENCOUNTER — Telehealth (HOSPITAL_COMMUNITY): Payer: Self-pay | Admitting: Oncology

## 2011-02-21 NOTE — Progress Notes (Signed)
Patient instructed to continue same coumadin dose and return in 2 weeks for pt/inr. Pt verbalized understanding.

## 2011-02-21 NOTE — Progress Notes (Signed)
Addended by: Oda Kilts on: 02/21/2011 02:36 PM   Modules accepted: Orders

## 2011-02-27 ENCOUNTER — Other Ambulatory Visit (HOSPITAL_COMMUNITY): Payer: Self-pay

## 2011-02-27 ENCOUNTER — Other Ambulatory Visit (HOSPITAL_COMMUNITY): Payer: Medicare Other

## 2011-03-01 ENCOUNTER — Ambulatory Visit (HOSPITAL_COMMUNITY): Payer: Medicare Other | Admitting: Oncology

## 2011-03-01 ENCOUNTER — Other Ambulatory Visit (HOSPITAL_COMMUNITY): Payer: Medicare Other

## 2011-03-12 ENCOUNTER — Ambulatory Visit (HOSPITAL_COMMUNITY): Payer: Medicare Other | Admitting: Oncology

## 2011-03-12 ENCOUNTER — Encounter (HOSPITAL_COMMUNITY): Payer: Self-pay | Admitting: Oncology

## 2011-03-12 ENCOUNTER — Encounter (HOSPITAL_BASED_OUTPATIENT_CLINIC_OR_DEPARTMENT_OTHER): Payer: Medicare Other | Admitting: Oncology

## 2011-03-12 ENCOUNTER — Encounter (HOSPITAL_COMMUNITY): Payer: Medicare Other

## 2011-03-12 VITALS — BP 119/76 | HR 75 | Temp 98.0°F | Wt 202.7 lb

## 2011-03-12 DIAGNOSIS — A498 Other bacterial infections of unspecified site: Secondary | ICD-10-CM

## 2011-03-12 DIAGNOSIS — E119 Type 2 diabetes mellitus without complications: Secondary | ICD-10-CM

## 2011-03-12 DIAGNOSIS — I82409 Acute embolism and thrombosis of unspecified deep veins of unspecified lower extremity: Secondary | ICD-10-CM

## 2011-03-12 DIAGNOSIS — C50419 Malignant neoplasm of upper-outer quadrant of unspecified female breast: Secondary | ICD-10-CM

## 2011-03-12 DIAGNOSIS — C50919 Malignant neoplasm of unspecified site of unspecified female breast: Secondary | ICD-10-CM

## 2011-03-12 NOTE — Progress Notes (Signed)
CC:   [name], NP Adolph Pollack, M.D. [name], M.D.  DIAGNOSIS: 1. Inflammatory carcinoma of the right breast, triple negative disease     with positive supraclavicular and infraclavicular nodes, axillary     nodes and a large eroding mass in the upper outer quadrant of the     right breast.  The right breast mass was 12.0 x 11.5 cm with     diffuse peau d'orange changes in the right breast, and the right     breast was approximately 2-1/2 times the size of the left breast.     She received dose-dense FEC for 5 cycles and was then switched to     carboplatin and Taxotere for 4 cycles.  She went on to have     bilateral mastectomies, showing no evidence of disease in the     breast and 14 of 14 negative nodes.  She was then treated with     radiation therapy by Dr. Margaretmary Bayley in March 2011.  She presented     here for the first time on 08/06/2008. 2. Escherichia coli sepsis after cycle 5 of FEC, at which time that     drug regimen was stopped and switched to carboplatin and Taxotere. 3. Deep vein thrombosis of the left arm at the site of a Port-A-Cath     and PICC line, and she will remain on Coumadin until the catheter     is removed. 4. Anemia secondary to chemotherapy which is resolved. 5. Diabetes mellitus. 6. Mild obesity, weighing 202 pounds today on a 5 feet 3-1/2 inch     frame. Wendy Gilbert is doing great.  Her oncologic review of systems is really negative except for a little sticking discomfort when she turns quickly or tries to reach for something in the axillary area on the right.  It is transitory, it lasts just a few seconds.  Other than that, she feels fine.  No shortness of breath.  No lumps anywhere.  No chest pain, etc.  PHYSICAL EXAMINATION:  Vital Signs:  All very stable.  Weight is down 3 pounds, approximately, from July.  Blood pressure 119/76 left arm sitting position, pulse 76 and regular, respirations 16 and unlabored. She denies any pain  and she is afebrile.  Lymphatic:  She has no lymphadenopathy in the cervical, supraclavicular, infraclavicular, axillary, or inguinal areas.  Chest:  Both chest walls are clear. Lungs:  Clear.  Heart:  Shows a regular rhythm and rate without murmur, rub or gallop.  Abdomen:  Soft and nontender without organomegaly. Bowel sounds are normal.  The left sided Port-A-Cath in the chest wall is unremarkable.  Vascular:  She has no peripheral edema.  We will see her in 6 months.  We will continue to maintain the Coumadin at this time unless she has issues with it.  At some point, I think we will have to discuss taking her port out, but I think we should wait approximately 3 years post completion of chemotherapy and radiation before we do that.    ______________________________ Ladona Horns. Mariel Sleet, MD ESN/MEDQ  D:  03/12/2011  T:  03/12/2011  Job:  [id number]

## 2011-03-12 NOTE — Patient Instructions (Signed)
Wendy Gilbert  [id number] 11/05/1960   [facility]  Discharge Instructions  RECOMMENDATIONS MADE BY THE CONSULTANT AND ANY TEST RESULTS WILL BE SENT TO YOUR REFERRING DOCTOR.   EXAM FINDINGS BY MD TODAY AND SIGNS AND SYMPTOMS TO REPORT TO CLINIC OR PRIMARY MD: You are doing well.  Report any lumps, bone pain or shortness of breath.  MEDICATIONS PRESCRIBED: none     SPECIAL INSTRUCTIONS/FOLLOW-UP: Return to Clinic:  See schedule.   I acknowledge that I have been informed and understand all the instructions given to me and received a copy. I do not have any more questions at this time, but understand that I may call the Specialty Clinic at [facility] at [phone] during business hours should I have any further questions or need assistance in obtaining follow-up care.    __________________________________________  _____________  __________ Signature of Patient or Authorized Representative            Date                   Time    __________________________________________ Nurse's Signature

## 2011-03-12 NOTE — Progress Notes (Signed)
This office note has been dictated.

## 2011-03-18 ENCOUNTER — Other Ambulatory Visit (HOSPITAL_COMMUNITY): Payer: Self-pay | Admitting: *Deleted

## 2011-03-18 ENCOUNTER — Encounter (HOSPITAL_COMMUNITY): Payer: Medicare Other | Attending: Oncology

## 2011-03-18 DIAGNOSIS — I82409 Acute embolism and thrombosis of unspecified deep veins of unspecified lower extremity: Secondary | ICD-10-CM

## 2011-03-18 LAB — PROTIME-INR: INR: 2.44 — ABNORMAL HIGH (ref 0.00–1.49)

## 2011-03-18 MED ORDER — SODIUM CHLORIDE 0.9 % IJ SOLN
10.0000 mL | INTRAMUSCULAR | Status: DC | PRN
  Administered 2011-03-18: 10 mL via INTRAVENOUS
  Filled 2011-03-18: qty 10

## 2011-03-18 MED ORDER — HEPARIN SOD (PORK) LOCK FLUSH 100 UNIT/ML IV SOLN
500.0000 [IU] | Freq: Once | INTRAVENOUS | Status: AC
  Administered 2011-03-18: 500 [IU] via INTRAVENOUS
  Filled 2011-03-18: qty 5

## 2011-03-18 NOTE — Progress Notes (Signed)
Wendy Gilbert Sample presented for Portacath access and flush. Proper placement of portacath confirmed by CXR. Portacath located left chest wall accessed with  H 20 needle. Good blood return present. Portacath flushed with 20ml NS and 500U/3ml Heparin and needle removed intact. Procedure without incident. Patient tolerated procedure well.  Current Coumadin dosage 7mg  7mg  6mg  alternating.

## 2011-03-28 ENCOUNTER — Encounter (INDEPENDENT_AMBULATORY_CARE_PROVIDER_SITE_OTHER): Payer: Self-pay | Admitting: General Surgery

## 2011-04-08 ENCOUNTER — Telehealth (HOSPITAL_COMMUNITY): Payer: Self-pay

## 2011-04-08 ENCOUNTER — Encounter (HOSPITAL_BASED_OUTPATIENT_CLINIC_OR_DEPARTMENT_OTHER): Payer: Medicare Other

## 2011-04-08 DIAGNOSIS — I82409 Acute embolism and thrombosis of unspecified deep veins of unspecified lower extremity: Secondary | ICD-10-CM

## 2011-04-08 DIAGNOSIS — I82629 Acute embolism and thrombosis of deep veins of unspecified upper extremity: Secondary | ICD-10-CM

## 2011-04-08 LAB — PROTIME-INR
INR: 3.03 — ABNORMAL HIGH (ref 0.00–1.49)
Prothrombin Time: 31.9 seconds — ABNORMAL HIGH (ref 11.6–15.2)

## 2011-04-08 MED ORDER — SODIUM CHLORIDE 0.9 % IJ SOLN
10.0000 mL | INTRAMUSCULAR | Status: DC | PRN
  Administered 2011-04-08: 10 mL via INTRAVENOUS
  Filled 2011-04-08: qty 10

## 2011-04-08 MED ORDER — SODIUM CHLORIDE 0.9 % IJ SOLN
INTRAMUSCULAR | Status: AC
  Administered 2011-04-08: 10 mL via INTRAVENOUS
  Filled 2011-04-08: qty 20

## 2011-04-08 MED ORDER — HEPARIN SOD (PORK) LOCK FLUSH 100 UNIT/ML IV SOLN
500.0000 [IU] | Freq: Once | INTRAVENOUS | Status: AC
  Administered 2011-04-08: 500 [IU] via INTRAVENOUS
  Filled 2011-04-08: qty 5

## 2011-04-08 MED ORDER — HEPARIN SOD (PORK) LOCK FLUSH 100 UNIT/ML IV SOLN
INTRAVENOUS | Status: AC
  Administered 2011-04-08: 500 [IU] via INTRAVENOUS
  Filled 2011-04-08: qty 5

## 2011-04-08 NOTE — Progress Notes (Addendum)
Wendy Gilbert presented for Portacath access and flush. Proper placement of portacath confirmed by CXR. Portacath located left chest wall accessed with  H 20 needle. Good blood return present.  20cc blood wasted; 5cc obtained for PT/INR. Portacath flushed with 20ml NS and 500U/19ml Heparin and needle removed intact. Procedure without incident. Patient tolerated procedure well.  Patient taking Coumadin 7-7-6, and patient states her last dose was at 2100 on 04/07/11.

## 2011-04-08 NOTE — Telephone Encounter (Signed)
Patient instructed to hold coumadin tonight and then start 7 mg alternating with 6 mg and to return for next PT/INR on 04/18/11.  Verbalizes understanding.

## 2011-04-18 ENCOUNTER — Other Ambulatory Visit (HOSPITAL_COMMUNITY): Payer: Medicare Other

## 2011-04-24 ENCOUNTER — Encounter (HOSPITAL_COMMUNITY): Payer: Medicare Other | Attending: Oncology

## 2011-04-24 DIAGNOSIS — C50919 Malignant neoplasm of unspecified site of unspecified female breast: Secondary | ICD-10-CM | POA: Insufficient documentation

## 2011-04-24 DIAGNOSIS — I82409 Acute embolism and thrombosis of unspecified deep veins of unspecified lower extremity: Secondary | ICD-10-CM

## 2011-04-24 DIAGNOSIS — C50419 Malignant neoplasm of upper-outer quadrant of unspecified female breast: Secondary | ICD-10-CM

## 2011-04-24 LAB — COMPREHENSIVE METABOLIC PANEL
ALT: 18 U/L (ref 0–35)
AST: 21 U/L (ref 0–37)
Albumin: 3.5 g/dL (ref 3.5–5.2)
Calcium: 9.3 mg/dL (ref 8.4–10.5)
Sodium: 141 mEq/L (ref 135–145)
Total Protein: 7.3 g/dL (ref 6.0–8.3)

## 2011-04-24 LAB — DIFFERENTIAL
Basophils Absolute: 0 10*3/uL (ref 0.0–0.1)
Eosinophils Absolute: 0.3 10*3/uL (ref 0.0–0.7)
Eosinophils Relative: 6 % — ABNORMAL HIGH (ref 0–5)
Lymphocytes Relative: 41 % (ref 12–46)

## 2011-04-24 LAB — CBC
MCH: 27.1 pg (ref 26.0–34.0)
MCV: 82.9 fL (ref 78.0–100.0)
Platelets: 254 10*3/uL (ref 150–400)
RDW: 14.1 % (ref 11.5–15.5)
WBC: 5.2 10*3/uL (ref 4.0–10.5)

## 2011-04-24 MED ORDER — SODIUM CHLORIDE 0.9 % IJ SOLN
10.0000 mL | INTRAMUSCULAR | Status: DC | PRN
  Administered 2011-04-24: 10 mL via INTRAVENOUS
  Filled 2011-04-24: qty 10

## 2011-04-24 MED ORDER — HEPARIN SOD (PORK) LOCK FLUSH 100 UNIT/ML IV SOLN
INTRAVENOUS | Status: AC
  Administered 2011-04-24: 500 [IU] via INTRAVENOUS
  Filled 2011-04-24: qty 5

## 2011-04-24 MED ORDER — SODIUM CHLORIDE 0.9 % IJ SOLN
INTRAMUSCULAR | Status: AC
  Administered 2011-04-24: 10 mL via INTRAVENOUS
  Filled 2011-04-24: qty 10

## 2011-04-24 MED ORDER — HEPARIN SOD (PORK) LOCK FLUSH 100 UNIT/ML IV SOLN
500.0000 [IU] | Freq: Once | INTRAVENOUS | Status: AC
  Administered 2011-04-24: 500 [IU] via INTRAVENOUS
  Filled 2011-04-24: qty 5

## 2011-04-24 NOTE — Progress Notes (Signed)
Tolerated port flush well. 

## 2011-04-26 ENCOUNTER — Other Ambulatory Visit (HOSPITAL_COMMUNITY): Payer: Self-pay

## 2011-04-26 DIAGNOSIS — I82409 Acute embolism and thrombosis of unspecified deep veins of unspecified lower extremity: Secondary | ICD-10-CM

## 2011-04-26 MED ORDER — WARFARIN SODIUM 1 MG PO TABS: ORAL_TABLET | ORAL | Status: DC

## 2011-04-29 ENCOUNTER — Other Ambulatory Visit (HOSPITAL_COMMUNITY): Payer: Self-pay | Admitting: Oncology

## 2011-04-29 DIAGNOSIS — I82409 Acute embolism and thrombosis of unspecified deep veins of unspecified lower extremity: Secondary | ICD-10-CM

## 2011-04-29 MED ORDER — WARFARIN SODIUM 6 MG PO TABS: ORAL_TABLET | ORAL | Status: DC

## 2011-04-29 MED ORDER — WARFARIN SODIUM 1 MG PO TABS: ORAL_TABLET | ORAL | Status: DC

## 2011-05-06 ENCOUNTER — Other Ambulatory Visit (HOSPITAL_COMMUNITY): Payer: Medicare Other

## 2011-05-15 ENCOUNTER — Encounter (HOSPITAL_COMMUNITY): Payer: Medicare Other | Attending: Oncology

## 2011-05-15 DIAGNOSIS — I82629 Acute embolism and thrombosis of deep veins of unspecified upper extremity: Secondary | ICD-10-CM

## 2011-05-15 DIAGNOSIS — I82409 Acute embolism and thrombosis of unspecified deep veins of unspecified lower extremity: Secondary | ICD-10-CM

## 2011-05-15 MED ORDER — HEPARIN SOD (PORK) LOCK FLUSH 100 UNIT/ML IV SOLN
500.0000 [IU] | Freq: Once | INTRAVENOUS | Status: AC
  Administered 2011-05-15: 500 [IU] via INTRAVENOUS
  Filled 2011-05-15: qty 5

## 2011-05-15 MED ORDER — HEPARIN SOD (PORK) LOCK FLUSH 100 UNIT/ML IV SOLN
INTRAVENOUS | Status: AC
  Filled 2011-05-15: qty 5

## 2011-05-15 MED ORDER — SODIUM CHLORIDE 0.9 % IJ SOLN
INTRAMUSCULAR | Status: AC
  Filled 2011-05-15: qty 10

## 2011-05-15 MED ORDER — SODIUM CHLORIDE 0.9 % IJ SOLN
10.0000 mL | Freq: Once | INTRAMUSCULAR | Status: AC
  Administered 2011-05-15: 10 mL via INTRAVENOUS
  Filled 2011-05-15: qty 10

## 2011-05-15 NOTE — Progress Notes (Signed)
Mickel Duhamel Cope presented for Portacath access and flush. Proper placement of portacath confirmed by CXR. Portacath located left chest wall accessed with  H 20 needle. Good blood return present. Portacath flushed with 20ml NS and 500U/9ml Heparin and needle removed intact. Procedure without incident. Patient tolerated procedure well.  Pt reports current Coumadin dose 7 mg alternating with 6 mg every other day. Denies any missed doses and no Zahlia Deshazer medications added.

## 2011-05-15 NOTE — Progress Notes (Signed)
Addended by: Sterling Big on: 05/15/2011 06:49 PM   Modules accepted: Orders

## 2011-05-28 ENCOUNTER — Other Ambulatory Visit (HOSPITAL_COMMUNITY): Payer: Self-pay | Admitting: Oncology

## 2011-05-28 DIAGNOSIS — I82409 Acute embolism and thrombosis of unspecified deep veins of unspecified lower extremity: Secondary | ICD-10-CM

## 2011-05-28 MED ORDER — WARFARIN SODIUM 5 MG PO TABS: 5.0000 mg | ORAL_TABLET | Freq: Every day | ORAL | Status: DC

## 2011-06-05 ENCOUNTER — Encounter (INDEPENDENT_AMBULATORY_CARE_PROVIDER_SITE_OTHER): Payer: Self-pay | Admitting: General Surgery

## 2011-06-05 ENCOUNTER — Ambulatory Visit (INDEPENDENT_AMBULATORY_CARE_PROVIDER_SITE_OTHER): Payer: Medicare Other | Admitting: General Surgery

## 2011-06-05 VITALS — BP 138/96 | HR 100 | Temp 98.2°F | Resp 16 | Ht 63.0 in | Wt 203.0 lb

## 2011-06-05 DIAGNOSIS — Z853 Personal history of malignant neoplasm of breast: Secondary | ICD-10-CM

## 2011-06-05 NOTE — Patient Instructions (Signed)
Call if you notice any hard nodules on your chest wall.

## 2011-06-05 NOTE — Progress Notes (Signed)
Operation: Right MRM and Left SM for inflammatory right breast CA  Date:02/2009  Stage: T4N3M0  Hormone receptor status: neg.  HPI:  Wendy Gilbert is here for followup of her right breast cancer.  She continues to  feel well.  She denies any chest wall nodules.  She denies any adenopathy.  She is still on Coumadin.  PE:Gen- WDWN in NAD.  Chest:  Bilateral chest wall scars with no palpable nodules; right chest wall with radiation changes to the skin; Portacath in left chest wall.  Lymph nodes:  No palpable cervical, supraclavicular or axillary adenopathy.  Assessment:  Right Breast cancer with no clinical evidence of recurrence.   Plan:  Return visit in 6 months.

## 2011-06-12 ENCOUNTER — Encounter (HOSPITAL_COMMUNITY): Payer: Medicare Other | Attending: Oncology

## 2011-06-12 ENCOUNTER — Other Ambulatory Visit (HOSPITAL_COMMUNITY): Payer: Self-pay | Admitting: Oncology

## 2011-06-12 DIAGNOSIS — I82409 Acute embolism and thrombosis of unspecified deep veins of unspecified lower extremity: Secondary | ICD-10-CM | POA: Insufficient documentation

## 2011-06-12 DIAGNOSIS — I82629 Acute embolism and thrombosis of deep veins of unspecified upper extremity: Secondary | ICD-10-CM

## 2011-06-12 LAB — PROTIME-INR
INR: 1.81 — ABNORMAL HIGH (ref 0.00–1.49)
Prothrombin Time: 21.3 seconds — ABNORMAL HIGH (ref 11.6–15.2)

## 2011-06-12 MED ORDER — SODIUM CHLORIDE 0.9 % IJ SOLN
INTRAMUSCULAR | Status: AC
  Filled 2011-06-12: qty 10

## 2011-06-12 MED ORDER — HEPARIN SOD (PORK) LOCK FLUSH 100 UNIT/ML IV SOLN
500.0000 [IU] | Freq: Once | INTRAVENOUS | Status: AC
  Administered 2011-06-12: 500 [IU] via INTRAVENOUS
  Filled 2011-06-12: qty 5

## 2011-06-12 MED ORDER — SODIUM CHLORIDE 0.9 % IJ SOLN
10.0000 mL | INTRAMUSCULAR | Status: DC | PRN
  Administered 2011-06-12: 10 mL via INTRAVENOUS
  Filled 2011-06-12: qty 10

## 2011-06-12 MED ORDER — HEPARIN SOD (PORK) LOCK FLUSH 100 UNIT/ML IV SOLN
INTRAVENOUS | Status: AC
  Filled 2011-06-12: qty 5

## 2011-06-12 NOTE — Progress Notes (Signed)
Wendy Gilbert presented for Portacath access and flush.  Proper placement of portacath confirmed by CXR.  Portacath located left chest wall accessed with  H 20 needle.  Good blood return present; labs also obtained for PT/INR. Portacath flushed with 20ml NS and 500U/74ml Heparin and needle removed intact.  Procedure without incident.  Patient tolerated procedure well.

## 2011-06-13 ENCOUNTER — Encounter (HOSPITAL_COMMUNITY): Payer: Self-pay | Admitting: *Deleted

## 2011-06-13 NOTE — Progress Notes (Signed)
Pt notified to continue same dose coumadin and pt level in one week.

## 2011-06-19 ENCOUNTER — Other Ambulatory Visit (HOSPITAL_COMMUNITY): Payer: Medicare Other

## 2011-06-21 ENCOUNTER — Encounter (HOSPITAL_BASED_OUTPATIENT_CLINIC_OR_DEPARTMENT_OTHER): Payer: Medicare Other

## 2011-06-21 ENCOUNTER — Telehealth (HOSPITAL_COMMUNITY): Payer: Self-pay

## 2011-06-21 ENCOUNTER — Encounter (HOSPITAL_COMMUNITY): Payer: Medicare Other

## 2011-06-21 ENCOUNTER — Other Ambulatory Visit (HOSPITAL_COMMUNITY): Payer: Self-pay | Admitting: Oncology

## 2011-06-21 DIAGNOSIS — I82409 Acute embolism and thrombosis of unspecified deep veins of unspecified lower extremity: Secondary | ICD-10-CM

## 2011-06-21 NOTE — Telephone Encounter (Signed)
Message left to continue same dosage of coumadin and to return for next PT/INR on 5/31.  Call back confirmation requested.

## 2011-06-21 NOTE — Progress Notes (Signed)
Wendy Gilbert presented for labwork. Labs per MD order drawn via Peripheral Line 25 gauge needle inserted in lt AC  Good blood return present. Procedure without incident.  Needle removed intact. Patient tolerated procedure well. Coumadin dose 6/7/6/7

## 2011-06-21 NOTE — Progress Notes (Signed)
Coumadin 6 mg alternate with 7 mg.

## 2011-07-12 ENCOUNTER — Other Ambulatory Visit (HOSPITAL_COMMUNITY): Payer: Self-pay | Admitting: Oncology

## 2011-07-12 ENCOUNTER — Encounter (HOSPITAL_BASED_OUTPATIENT_CLINIC_OR_DEPARTMENT_OTHER): Payer: Medicare Other

## 2011-07-12 DIAGNOSIS — I82409 Acute embolism and thrombosis of unspecified deep veins of unspecified lower extremity: Secondary | ICD-10-CM

## 2011-07-12 MED ORDER — SODIUM CHLORIDE 0.9 % IJ SOLN
10.0000 mL | INTRAMUSCULAR | Status: DC | PRN
  Administered 2011-07-12: 10 mL via INTRAVENOUS
  Filled 2011-07-12: qty 10

## 2011-07-12 MED ORDER — HEPARIN SOD (PORK) LOCK FLUSH 100 UNIT/ML IV SOLN
500.0000 [IU] | Freq: Once | INTRAVENOUS | Status: AC
  Administered 2011-07-12: 500 [IU] via INTRAVENOUS
  Filled 2011-07-12: qty 5

## 2011-07-12 MED ORDER — SODIUM CHLORIDE 0.9 % IJ SOLN
INTRAMUSCULAR | Status: AC
  Filled 2011-07-12: qty 10

## 2011-07-12 MED ORDER — HEPARIN SOD (PORK) LOCK FLUSH 100 UNIT/ML IV SOLN
INTRAVENOUS | Status: AC
  Filled 2011-07-12: qty 5

## 2011-07-12 NOTE — Progress Notes (Signed)
Pt notified to continue same dose coumadin and pt inr 7/29

## 2011-07-12 NOTE — Progress Notes (Signed)
Tolerated port flush well.  Good blood return. 

## 2011-09-09 ENCOUNTER — Other Ambulatory Visit (HOSPITAL_COMMUNITY): Payer: Medicare Other

## 2011-09-09 ENCOUNTER — Encounter (HOSPITAL_BASED_OUTPATIENT_CLINIC_OR_DEPARTMENT_OTHER): Payer: Medicare Other | Admitting: Oncology

## 2011-09-09 ENCOUNTER — Encounter (HOSPITAL_COMMUNITY): Payer: Medicare Other | Attending: Oncology

## 2011-09-09 VITALS — BP 134/87 | HR 105 | Temp 97.8°F | Wt 197.8 lb

## 2011-09-09 DIAGNOSIS — I82409 Acute embolism and thrombosis of unspecified deep veins of unspecified lower extremity: Secondary | ICD-10-CM

## 2011-09-09 DIAGNOSIS — R5383 Other fatigue: Secondary | ICD-10-CM | POA: Insufficient documentation

## 2011-09-09 DIAGNOSIS — R531 Weakness: Secondary | ICD-10-CM

## 2011-09-09 DIAGNOSIS — C50419 Malignant neoplasm of upper-outer quadrant of unspecified female breast: Secondary | ICD-10-CM

## 2011-09-09 DIAGNOSIS — I824Z9 Acute embolism and thrombosis of unspecified deep veins of unspecified distal lower extremity: Secondary | ICD-10-CM

## 2011-09-09 DIAGNOSIS — R5381 Other malaise: Secondary | ICD-10-CM | POA: Insufficient documentation

## 2011-09-09 DIAGNOSIS — C50919 Malignant neoplasm of unspecified site of unspecified female breast: Secondary | ICD-10-CM | POA: Insufficient documentation

## 2011-09-09 LAB — CBC WITH DIFFERENTIAL/PLATELET
Eosinophils Relative: 4 % (ref 0–5)
HCT: 39 % (ref 36.0–46.0)
Hemoglobin: 12.7 g/dL (ref 12.0–15.0)
Lymphocytes Relative: 36 % (ref 12–46)
Lymphs Abs: 1.6 10*3/uL (ref 0.7–4.0)
MCV: 83.9 fL (ref 78.0–100.0)
Monocytes Absolute: 0.3 10*3/uL (ref 0.1–1.0)
Neutro Abs: 2.4 10*3/uL (ref 1.7–7.7)
RBC: 4.65 MIL/uL (ref 3.87–5.11)
WBC: 4.5 10*3/uL (ref 4.0–10.5)

## 2011-09-09 LAB — COMPREHENSIVE METABOLIC PANEL
AST: 22 U/L (ref 0–37)
Albumin: 3.7 g/dL (ref 3.5–5.2)
Calcium: 9.6 mg/dL (ref 8.4–10.5)
Creatinine, Ser: 0.63 mg/dL (ref 0.50–1.10)
Total Protein: 7.8 g/dL (ref 6.0–8.3)

## 2011-09-09 LAB — PROTIME-INR
INR: 2.21 — ABNORMAL HIGH (ref 0.00–1.49)
Prothrombin Time: 24.9 seconds — ABNORMAL HIGH (ref 11.6–15.2)

## 2011-09-09 MED ORDER — SODIUM CHLORIDE 0.9 % IJ SOLN
INTRAMUSCULAR | Status: AC
  Filled 2011-09-09: qty 10

## 2011-09-09 MED ORDER — SODIUM CHLORIDE 0.9 % IJ SOLN
10.0000 mL | INTRAMUSCULAR | Status: DC | PRN
Start: 2011-09-09 — End: 2011-09-09
  Administered 2011-09-09: 10 mL via INTRAVENOUS
  Filled 2011-09-09: qty 10

## 2011-09-09 MED ORDER — HEPARIN SOD (PORK) LOCK FLUSH 100 UNIT/ML IV SOLN
500.0000 [IU] | Freq: Once | INTRAVENOUS | Status: AC
  Administered 2011-09-09: 500 [IU] via INTRAVENOUS
  Filled 2011-09-09: qty 5

## 2011-09-09 MED ORDER — HEPARIN SOD (PORK) LOCK FLUSH 100 UNIT/ML IV SOLN
INTRAVENOUS | Status: AC
  Filled 2011-09-09: qty 5

## 2011-09-09 NOTE — Progress Notes (Signed)
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 The mass was 12 x 11.5 cm with diffuse peau d'orange of changes in the right breast in the right breast was also 2-1/2 times the size of the left breast. She received dose dense FVC for 5 cycles then switched to carboplatin and Taxotere 4 cycles. She then had bilateral mastectomies showing no evidence of disease in the breast and 14 lymph nodes were negative. She was treated with radiation therapy by Dr. Clydia Llano in March of 2011 presented here for the first time on 08/06/2008. She will be out 3 years from completion of all therapy as of December 2013.  Problem #2 DVT of the left arm on the site of the Port-A-Cath and she will remain on Coumadin to the Port-A-Cath is removed. I think we can have the Port-A-Cath removed in January when I see her next if all is well.  Escherichia coli sepsis after cycle 5 of FEC at which time that drug regimen was stopped and we switched her to carboplatin and Taxotere.  Her other problems include her weight, her diabetes mellitus, and she had anemia secondary to chemotherapy with results. Presently she feels weak and tired doesn't have as much energy so we'll do some other blood work today. Her INR is pending. She has no other complaints such as nodularity or pain her bowel problems etc. She has no blood in her stools but she does complain of sore nose both sides were she has irritation of the septa bilaterally. There is no distinct ulceration per se cyst you're taking on both sides and she does state that she picks scab periodically from the left side of the nose. I could not appreciate that today.  HEENT exam otherwise shows symmetry of the face. She has no adenopathy in the cervical supraclavicular infraclavicular axillary or inguinal areas. She is in no  acute distress. Vital signs are stable weight is down a couple pounds. Lungs are clear. She has no bony tenderness heart shows a regular rhythm and rate without murmur rub or gallop. Both chest walls are clear. Her Port-A-Cath is intact. Abdomen is soft obese nontender without obvious or get a megaly and she has no arm or leg edema.  We will see her in January if all is well we'll get her Port-A-Cath removed by Dr. Bonnee Quin. We will do some blood work today including a TSH iron studies B12 folic acid levels because of her weakness and easy fatigability.

## 2011-09-09 NOTE — Progress Notes (Signed)
Port flush performed at Dr."s visit.

## 2011-09-09 NOTE — Patient Instructions (Signed)
[facility]  Discharge Instructions Wendy Gilbert  [id number] 07/07/1960 Dr. Glenford Peers  RECOMMENDATIONS MADE BY THE CONSULTANT AND ANY TEST RESULTS WILL BE SENT TO YOUR REFERRING DOCTOR.   EXAM FINDINGS BY MD TODAY AND SIGNS AND SYMPTOMS TO REPORT TO CLINIC OR PRIMARY MD:  Exam per Dr. Mariel Sleet  MEDICATIONS PRESCRIBED: We will call you about coumadin. We need to check PT at least monthly   INSTRUCTIONS GIVEN AND DISCUSSED: When you come back in 6 months we will talk about getting port out and stopping coumadin  SPECIAL INSTRUCTIONS/FOLLOW-UP: Labs and port flush monthly See Dr Mariel Sleet in 6 months   I acknowledge that I have been informed and understand all the instructions given to me and received a copy. I do not have any more questions at this time, but understand that I may call the Specialty Clinic at [facility] at [phone] during business hours should I have any further questions or need assistance in obtaining follow-up care.    __________________________________________  _____________  __________ Signature of Patient or Authorized Representative            Date                   Time    __________________________________________ Nurse's Signature

## 2011-09-09 NOTE — Progress Notes (Signed)
Mickel Duhamel Raap presented for Portacath access and flush. Proper placement of portacath confirmed by CXR. Portacath located left chest wall accessed with  H 20 needle. Good blood return present. Portacath flushed with 20ml NS and 500U/13ml Heparin and needle removed intact. Procedure without incident. Patient tolerated procedure well.  Current dose Coumadin 6mg  alternating with 7mg  every other day.

## 2011-09-10 ENCOUNTER — Ambulatory Visit (HOSPITAL_COMMUNITY): Payer: Medicare Other | Admitting: Oncology

## 2011-09-10 LAB — FOLATE: Folate: >20 ng/mL

## 2011-09-10 LAB — VITAMIN B12: Vitamin B-12: 352 pg/mL (ref 211–911)

## 2011-10-07 ENCOUNTER — Encounter (HOSPITAL_COMMUNITY): Payer: Medicare Other

## 2011-10-08 ENCOUNTER — Encounter (HOSPITAL_COMMUNITY): Payer: Medicare Other | Attending: Oncology

## 2011-10-08 ENCOUNTER — Telehealth (HOSPITAL_COMMUNITY): Payer: Self-pay

## 2011-10-08 DIAGNOSIS — I82409 Acute embolism and thrombosis of unspecified deep veins of unspecified lower extremity: Secondary | ICD-10-CM | POA: Insufficient documentation

## 2011-10-08 MED ORDER — SODIUM CHLORIDE 0.9 % IJ SOLN
10.0000 mL | INTRAMUSCULAR | Status: DC | PRN
  Administered 2011-10-08: 10 mL via INTRAVENOUS
  Filled 2011-10-08: qty 10

## 2011-10-08 MED ORDER — HEPARIN SOD (PORK) LOCK FLUSH 100 UNIT/ML IV SOLN
500.0000 [IU] | Freq: Once | INTRAVENOUS | Status: AC
  Administered 2011-10-08: 500 [IU] via INTRAVENOUS
  Filled 2011-10-08: qty 5

## 2011-10-08 MED ORDER — SODIUM CHLORIDE 0.9 % IJ SOLN
INTRAMUSCULAR | Status: AC
  Filled 2011-10-08: qty 10

## 2011-10-08 MED ORDER — HEPARIN SOD (PORK) LOCK FLUSH 100 UNIT/ML IV SOLN
INTRAVENOUS | Status: AC
  Filled 2011-10-08: qty 5

## 2011-10-08 NOTE — Telephone Encounter (Signed)
Unable to reach by phone

## 2011-10-08 NOTE — Progress Notes (Signed)
Tolerated port flush well. 

## 2011-10-18 ENCOUNTER — Other Ambulatory Visit (HOSPITAL_COMMUNITY): Payer: Medicare Other

## 2011-10-21 ENCOUNTER — Encounter (HOSPITAL_COMMUNITY): Payer: Medicare Other

## 2011-11-05 ENCOUNTER — Encounter (HOSPITAL_COMMUNITY): Payer: Medicare Other | Attending: Oncology

## 2011-11-05 DIAGNOSIS — I824Z9 Acute embolism and thrombosis of unspecified deep veins of unspecified distal lower extremity: Secondary | ICD-10-CM

## 2011-11-05 DIAGNOSIS — I82409 Acute embolism and thrombosis of unspecified deep veins of unspecified lower extremity: Secondary | ICD-10-CM

## 2011-11-05 LAB — PROTIME-INR: INR: 2.16 — ABNORMAL HIGH (ref 0.00–1.49)

## 2011-11-05 MED ORDER — HEPARIN SOD (PORK) LOCK FLUSH 100 UNIT/ML IV SOLN
500.0000 [IU] | Freq: Once | INTRAVENOUS | Status: AC
  Administered 2011-11-05: 500 [IU] via INTRAVENOUS
  Filled 2011-11-05: qty 5

## 2011-11-05 MED ORDER — SODIUM CHLORIDE 0.9 % IJ SOLN
10.0000 mL | INTRAMUSCULAR | Status: DC | PRN
  Administered 2011-11-05: 10 mL via INTRAVENOUS
  Filled 2011-11-05: qty 10

## 2011-11-05 MED ORDER — SODIUM CHLORIDE 0.9 % IJ SOLN
INTRAMUSCULAR | Status: AC
  Filled 2011-11-05: qty 10

## 2011-11-05 NOTE — Progress Notes (Signed)
Wendy Gilbert presented for Portacath access and flush. Proper placement of portacath confirmed by CXR. Portacath located lt chest wall accessed with  H 20 needle. Good blood return present. PT INR drawn. Patient on 7/7/6 of coumadin Portacath flushed with 20ml NS and 500U/73ml Heparin and needle removed intact. Procedure without incident. Patient tolerated procedure well.

## 2011-11-19 ENCOUNTER — Encounter (HOSPITAL_COMMUNITY): Payer: Medicare Other

## 2011-12-03 ENCOUNTER — Encounter (INDEPENDENT_AMBULATORY_CARE_PROVIDER_SITE_OTHER): Payer: Self-pay | Admitting: General Surgery

## 2011-12-03 ENCOUNTER — Ambulatory Visit (INDEPENDENT_AMBULATORY_CARE_PROVIDER_SITE_OTHER): Payer: Medicare Other | Admitting: General Surgery

## 2011-12-03 VITALS — BP 128/78 | HR 84 | Temp 98.1°F | Resp 20 | Ht 63.75 in | Wt 202.0 lb

## 2011-12-03 DIAGNOSIS — Z853 Personal history of malignant neoplasm of breast: Secondary | ICD-10-CM

## 2011-12-03 NOTE — Progress Notes (Signed)
Operation: Right MRM and Left SM for inflammatory right breast CA  Date:02/2009  Stage: T4N3M0  Hormone receptor status: neg.  HPI:  Wendy Gilbert is here for followup of her right breast cancer.  She continues to  feel well.  She denies any chest wall nodules.  She denies any adenopathy.  She is still on Coumadin.  She has a rash on her abdominal wall and back that is persistent.  PE:Gen- WDWN in NAD.  Chest:  Bilateral chest wall scars with no palpable nodules; right chest wall with radiation changes to the skin; Portacath in left chest wall.  Lymph nodes:  No palpable cervical, supraclavicular or axillary adenopathy.  Skin:  Punctate rash in epigastric region and midback.  Assessment:  Right Breast cancer with no clinical evidence of recurrence.  New rash.   Plan:  Return visit in 6 months.  Try hydrocortisone cream for rash.

## 2011-12-03 NOTE — Patient Instructions (Signed)
Call if you feel any new nodules on your chest wall.  Try hydrocortisone cream for your rash.

## 2011-12-05 ENCOUNTER — Other Ambulatory Visit (HOSPITAL_COMMUNITY): Payer: Self-pay | Admitting: Oncology

## 2011-12-05 ENCOUNTER — Encounter (HOSPITAL_COMMUNITY): Payer: Medicare Other | Attending: Oncology

## 2011-12-05 DIAGNOSIS — I82409 Acute embolism and thrombosis of unspecified deep veins of unspecified lower extremity: Secondary | ICD-10-CM

## 2011-12-05 DIAGNOSIS — C50419 Malignant neoplasm of upper-outer quadrant of unspecified female breast: Secondary | ICD-10-CM

## 2011-12-05 DIAGNOSIS — Z452 Encounter for adjustment and management of vascular access device: Secondary | ICD-10-CM

## 2011-12-05 LAB — PROTIME-INR: Prothrombin Time: 20.7 seconds — ABNORMAL HIGH (ref 11.6–15.2)

## 2011-12-05 MED ORDER — HEPARIN SOD (PORK) LOCK FLUSH 100 UNIT/ML IV SOLN
INTRAVENOUS | Status: AC
  Filled 2011-12-05: qty 5

## 2011-12-05 MED ORDER — SODIUM CHLORIDE 0.9 % IJ SOLN
20.0000 mL | INTRAMUSCULAR | Status: DC | PRN
  Administered 2011-12-05: 20 mL via INTRAVENOUS
  Filled 2011-12-05: qty 20

## 2011-12-05 MED ORDER — HEPARIN SOD (PORK) LOCK FLUSH 100 UNIT/ML IV SOLN
500.0000 [IU] | Freq: Once | INTRAVENOUS | Status: AC
  Administered 2011-12-05: 500 [IU] via INTRAVENOUS
  Filled 2011-12-05: qty 5

## 2011-12-05 MED ORDER — SODIUM CHLORIDE 0.9 % IJ SOLN
INTRAMUSCULAR | Status: AC
  Filled 2011-12-05: qty 20

## 2011-12-05 NOTE — Progress Notes (Signed)
Wendy Gilbert presented for Portacath access and flush. Proper placement of portacath confirmed by CXR. Portacath located left chest wall accessed with  H 20 needle. Good blood return present.  Specimen drawn for the lab. Portacath flushed with 20ml NS and 500U/54ml Heparin and needle removed intact. Procedure without incident. Patient tolerated procedure well.

## 2011-12-19 ENCOUNTER — Other Ambulatory Visit (HOSPITAL_COMMUNITY): Payer: Medicare Other

## 2011-12-23 ENCOUNTER — Encounter (HOSPITAL_COMMUNITY): Payer: Medicare Other | Attending: Oncology

## 2011-12-23 DIAGNOSIS — I82409 Acute embolism and thrombosis of unspecified deep veins of unspecified lower extremity: Secondary | ICD-10-CM

## 2011-12-23 DIAGNOSIS — I824Z9 Acute embolism and thrombosis of unspecified deep veins of unspecified distal lower extremity: Secondary | ICD-10-CM

## 2011-12-23 LAB — PROTIME-INR
INR: 1.85 — ABNORMAL HIGH (ref 0.00–1.49)
Prothrombin Time: 20.7 seconds — ABNORMAL HIGH (ref 11.6–15.2)

## 2011-12-23 MED ORDER — HEPARIN SOD (PORK) LOCK FLUSH 100 UNIT/ML IV SOLN
INTRAVENOUS | Status: AC
  Filled 2011-12-23: qty 5

## 2011-12-23 MED ORDER — SODIUM CHLORIDE 0.9 % IJ SOLN
INTRAMUSCULAR | Status: AC
  Filled 2011-12-23: qty 10

## 2011-12-23 MED ORDER — HEPARIN SOD (PORK) LOCK FLUSH 100 UNIT/ML IV SOLN
500.0000 [IU] | Freq: Once | INTRAVENOUS | Status: AC
  Administered 2011-12-23: 500 [IU] via INTRAVENOUS
  Filled 2011-12-23: qty 5

## 2011-12-23 MED ORDER — SODIUM CHLORIDE 0.9 % IJ SOLN
20.0000 mL | INTRAMUSCULAR | Status: DC | PRN
  Administered 2011-12-23: 20 mL via INTRAVENOUS
  Filled 2011-12-23: qty 20

## 2011-12-23 NOTE — Progress Notes (Signed)
Wendy Gilbert presented for Portacath access and flush. Proper placement of portacath confirmed by CXR. Portacath located left chest wall accessed with  H 20 needle. Good blood return present.  Specimen drawn for labs.   Portacath flushed with 20ml NS and 500U/43ml Heparin and needle removed intact. Procedure without incident. Patient tolerated procedure well.

## 2011-12-24 ENCOUNTER — Telehealth (HOSPITAL_COMMUNITY): Payer: Self-pay

## 2011-12-24 NOTE — Telephone Encounter (Signed)
No missed doses per patient and instructed to change coumadin to 7 mg daily and to return for next PT/INR in 2 weeks per discussion with MD.  Trenton Gammon understanding.

## 2012-01-02 ENCOUNTER — Other Ambulatory Visit (HOSPITAL_COMMUNITY): Payer: Medicare Other

## 2012-01-06 ENCOUNTER — Encounter (HOSPITAL_BASED_OUTPATIENT_CLINIC_OR_DEPARTMENT_OTHER): Payer: Medicare Other

## 2012-01-06 DIAGNOSIS — I82409 Acute embolism and thrombosis of unspecified deep veins of unspecified lower extremity: Secondary | ICD-10-CM

## 2012-01-06 DIAGNOSIS — I824Z9 Acute embolism and thrombosis of unspecified deep veins of unspecified distal lower extremity: Secondary | ICD-10-CM

## 2012-01-06 LAB — PROTIME-INR
INR: 2.54 — ABNORMAL HIGH (ref 0.00–1.49)
Prothrombin Time: 26.1 seconds — ABNORMAL HIGH (ref 11.6–15.2)

## 2012-01-06 MED ORDER — SODIUM CHLORIDE 0.9 % IJ SOLN
INTRAMUSCULAR | Status: AC
  Filled 2012-01-06: qty 10

## 2012-01-06 MED ORDER — HEPARIN SOD (PORK) LOCK FLUSH 100 UNIT/ML IV SOLN
INTRAVENOUS | Status: AC
  Filled 2012-01-06: qty 5

## 2012-01-06 MED ORDER — SODIUM CHLORIDE 0.9 % IJ SOLN
10.0000 mL | INTRAMUSCULAR | Status: DC | PRN
  Administered 2012-01-06: 10 mL via INTRAVENOUS
  Filled 2012-01-06: qty 10

## 2012-01-06 MED ORDER — HEPARIN SOD (PORK) LOCK FLUSH 100 UNIT/ML IV SOLN
500.0000 [IU] | Freq: Once | INTRAVENOUS | Status: AC
  Administered 2012-01-06: 500 [IU] via INTRAVENOUS
  Filled 2012-01-06: qty 5

## 2012-01-06 NOTE — Progress Notes (Signed)
Wendy Gilbert presented for Portacath access and flush.  Proper placement of portacath confirmed by CXR.  Portacath located left chest wall accessed with  H 20 needle.  Good blood return present; labs drawn. Portacath flushed with 20ml NS and 500U/84ml Heparin and needle removed intact.  Procedure without incident.  Patient tolerated procedure well.

## 2012-01-15 ENCOUNTER — Telehealth (HOSPITAL_COMMUNITY): Payer: Self-pay | Admitting: *Deleted

## 2012-01-15 ENCOUNTER — Encounter (HOSPITAL_COMMUNITY): Payer: Medicare Other | Attending: Oncology

## 2012-01-15 DIAGNOSIS — C50419 Malignant neoplasm of upper-outer quadrant of unspecified female breast: Secondary | ICD-10-CM

## 2012-01-15 DIAGNOSIS — I82409 Acute embolism and thrombosis of unspecified deep veins of unspecified lower extremity: Secondary | ICD-10-CM | POA: Insufficient documentation

## 2012-01-15 DIAGNOSIS — Z452 Encounter for adjustment and management of vascular access device: Secondary | ICD-10-CM

## 2012-01-15 MED ORDER — SODIUM CHLORIDE 0.9 % IJ SOLN
INTRAMUSCULAR | Status: AC
  Filled 2012-01-15: qty 20

## 2012-01-15 MED ORDER — HEPARIN SOD (PORK) LOCK FLUSH 100 UNIT/ML IV SOLN
INTRAVENOUS | Status: AC
  Filled 2012-01-15: qty 5

## 2012-01-15 MED ORDER — SODIUM CHLORIDE 0.9 % IJ SOLN
INTRAMUSCULAR | Status: AC
  Filled 2012-01-15: qty 10

## 2012-01-15 MED ORDER — HEPARIN SOD (PORK) LOCK FLUSH 100 UNIT/ML IV SOLN
500.0000 [IU] | Freq: Once | INTRAVENOUS | Status: AC
  Administered 2012-01-15: 500 [IU] via INTRAVENOUS
  Filled 2012-01-15: qty 5

## 2012-01-15 NOTE — Progress Notes (Signed)
Wendy Gilbert presented for Portacath access and flush. Proper placement of portacath confirmed by CXR. Portacath located  LT chest wall accessed with  H 20 needle. Good blood return present. Portacath flushed with 20ml NS and 500U/44ml Heparin and needle removed intact. Procedure without incident. Patient tolerated procedure well.

## 2012-01-15 NOTE — Telephone Encounter (Signed)
Pt notified to continue 7 mg daily. PT inr in 2 weeks

## 2012-01-30 ENCOUNTER — Encounter (HOSPITAL_BASED_OUTPATIENT_CLINIC_OR_DEPARTMENT_OTHER): Payer: Medicare Other

## 2012-01-30 ENCOUNTER — Other Ambulatory Visit (HOSPITAL_COMMUNITY): Payer: Self-pay | Admitting: Oncology

## 2012-01-30 DIAGNOSIS — I82409 Acute embolism and thrombosis of unspecified deep veins of unspecified lower extremity: Secondary | ICD-10-CM

## 2012-01-30 LAB — PROTIME-INR
INR: 2.38 — ABNORMAL HIGH (ref 0.00–1.49)
Prothrombin Time: 24.9 seconds — ABNORMAL HIGH (ref 11.6–15.2)

## 2012-01-30 MED ORDER — HEPARIN SOD (PORK) LOCK FLUSH 100 UNIT/ML IV SOLN
500.0000 [IU] | Freq: Once | INTRAVENOUS | Status: AC
  Administered 2012-01-30: 500 [IU] via INTRAVENOUS
  Filled 2012-01-30: qty 5

## 2012-01-30 MED ORDER — SODIUM CHLORIDE 0.9 % IJ SOLN
10.0000 mL | INTRAMUSCULAR | Status: DC | PRN
  Administered 2012-01-30: 10 mL via INTRAVENOUS
  Filled 2012-01-30: qty 10

## 2012-01-30 MED ORDER — HEPARIN SOD (PORK) LOCK FLUSH 100 UNIT/ML IV SOLN
INTRAVENOUS | Status: AC
  Filled 2012-01-30: qty 5

## 2012-01-30 MED ORDER — SODIUM CHLORIDE 0.9 % IJ SOLN
INTRAMUSCULAR | Status: AC
  Filled 2012-01-30: qty 10

## 2012-01-30 NOTE — Progress Notes (Signed)
Wendy Gilbert presented for Portacath access and flush.  Proper placement of portacath confirmed by CXR.  Portacath located left chest wall accessed with  H 20 needle.  Good blood return present. Portacath flushed with 20ml NS and 500U/85ml Heparin and needle removed intact.  Procedure without incident.  Patient tolerated procedure well.

## 2012-02-20 ENCOUNTER — Encounter (HOSPITAL_COMMUNITY): Payer: Medicare Other | Attending: Oncology

## 2012-02-20 ENCOUNTER — Other Ambulatory Visit (HOSPITAL_COMMUNITY): Payer: Self-pay | Admitting: Oncology

## 2012-02-20 DIAGNOSIS — Z9889 Other specified postprocedural states: Secondary | ICD-10-CM | POA: Insufficient documentation

## 2012-02-20 DIAGNOSIS — I82409 Acute embolism and thrombosis of unspecified deep veins of unspecified lower extremity: Secondary | ICD-10-CM | POA: Insufficient documentation

## 2012-02-20 DIAGNOSIS — Z452 Encounter for adjustment and management of vascular access device: Secondary | ICD-10-CM

## 2012-02-20 DIAGNOSIS — C50419 Malignant neoplasm of upper-outer quadrant of unspecified female breast: Secondary | ICD-10-CM

## 2012-02-20 DIAGNOSIS — Z95828 Presence of other vascular implants and grafts: Secondary | ICD-10-CM

## 2012-02-20 MED ORDER — SODIUM CHLORIDE 0.9 % IJ SOLN
10.0000 mL | Freq: Once | INTRAMUSCULAR | Status: AC
  Administered 2012-02-20: 10 mL via INTRAVENOUS
  Filled 2012-02-20: qty 10

## 2012-02-20 MED ORDER — HEPARIN SOD (PORK) LOCK FLUSH 100 UNIT/ML IV SOLN
500.0000 [IU] | Freq: Once | INTRAVENOUS | Status: AC
  Administered 2012-02-20: 500 [IU] via INTRAVENOUS
  Filled 2012-02-20: qty 5

## 2012-02-20 MED ORDER — HEPARIN SOD (PORK) LOCK FLUSH 100 UNIT/ML IV SOLN
INTRAVENOUS | Status: AC
  Filled 2012-02-20: qty 5

## 2012-02-20 NOTE — Progress Notes (Signed)
Wendy Gilbert presented for Portacath access and labs via port.  Proper placement of portacath confirmed by CXR.  Portacath located left chest wall accessed with  H 20 needle.  Good blood return present, 15cc blood wasted and 5 obtained for labs. Portacath flushed with 20ml NS and 500U/9ml Heparin and needle removed intact.  Procedure tolerated well and without incident.

## 2012-03-11 ENCOUNTER — Ambulatory Visit (HOSPITAL_COMMUNITY): Payer: Medicare Other | Admitting: Oncology

## 2012-03-11 ENCOUNTER — Other Ambulatory Visit (HOSPITAL_COMMUNITY): Payer: Medicare Other

## 2012-03-16 ENCOUNTER — Telehealth (HOSPITAL_COMMUNITY): Payer: Self-pay

## 2012-03-16 ENCOUNTER — Encounter (HOSPITAL_COMMUNITY): Payer: Medicare Other | Attending: Oncology | Admitting: Oncology

## 2012-03-16 ENCOUNTER — Other Ambulatory Visit (HOSPITAL_COMMUNITY): Payer: Self-pay | Admitting: Oncology

## 2012-03-16 ENCOUNTER — Encounter (HOSPITAL_COMMUNITY): Payer: Medicare Other

## 2012-03-16 ENCOUNTER — Encounter (HOSPITAL_COMMUNITY): Payer: Self-pay | Admitting: Oncology

## 2012-03-16 VITALS — BP 114/78 | HR 111 | Temp 98.1°F | Resp 18 | Wt 201.2 lb

## 2012-03-16 DIAGNOSIS — C50919 Malignant neoplasm of unspecified site of unspecified female breast: Secondary | ICD-10-CM | POA: Insufficient documentation

## 2012-03-16 DIAGNOSIS — C50419 Malignant neoplasm of upper-outer quadrant of unspecified female breast: Secondary | ICD-10-CM

## 2012-03-16 DIAGNOSIS — I82409 Acute embolism and thrombosis of unspecified deep veins of unspecified lower extremity: Secondary | ICD-10-CM

## 2012-03-16 DIAGNOSIS — C778 Secondary and unspecified malignant neoplasm of lymph nodes of multiple regions: Secondary | ICD-10-CM

## 2012-03-16 DIAGNOSIS — E119 Type 2 diabetes mellitus without complications: Secondary | ICD-10-CM

## 2012-03-16 LAB — CBC WITH DIFFERENTIAL/PLATELET
Basophils Absolute: 0.1 10*3/uL (ref 0.0–0.1)
Basophils Relative: 1 % (ref 0–1)
Eosinophils Relative: 4 % (ref 0–5)
Lymphocytes Relative: 36 % (ref 12–46)
Neutro Abs: 2.1 10*3/uL (ref 1.7–7.7)
Platelets: 249 10*3/uL (ref 150–400)
RDW: 14.2 % (ref 11.5–15.5)
WBC: 4.3 10*3/uL (ref 4.0–10.5)

## 2012-03-16 LAB — COMPREHENSIVE METABOLIC PANEL
ALT: 18 U/L (ref 0–35)
AST: 22 U/L (ref 0–37)
Albumin: 3.8 g/dL (ref 3.5–5.2)
CO2: 26 mEq/L (ref 19–32)
Calcium: 9.2 mg/dL (ref 8.4–10.5)
Chloride: 105 mEq/L (ref 96–112)
GFR calc non Af Amer: >90 mL/min (ref >90)
Sodium: 140 mEq/L (ref 135–145)

## 2012-03-16 LAB — PROTIME-INR
INR: 2.29 — ABNORMAL HIGH (ref 0.00–1.49)
Prothrombin Time: 24.2 seconds — ABNORMAL HIGH (ref 11.6–15.2)

## 2012-03-16 MED ORDER — HEPARIN SOD (PORK) LOCK FLUSH 100 UNIT/ML IV SOLN
500.0000 [IU] | Freq: Once | INTRAVENOUS | Status: AC
  Administered 2012-03-16: 500 [IU] via INTRAVENOUS
  Filled 2012-03-16: qty 5

## 2012-03-16 MED ORDER — ENOXAPARIN SODIUM 120 MG/0.8ML ~~LOC~~ SOLN: SUBCUTANEOUS | Status: DC

## 2012-03-16 MED ORDER — SODIUM CHLORIDE 0.9 % IJ SOLN
10.0000 mL | INTRAMUSCULAR | Status: DC | PRN
  Administered 2012-03-16: 10 mL via INTRAVENOUS
  Filled 2012-03-16: qty 10

## 2012-03-16 MED ORDER — HEPARIN SOD (PORK) LOCK FLUSH 100 UNIT/ML IV SOLN
INTRAVENOUS | Status: AC
  Filled 2012-03-16: qty 5

## 2012-03-16 NOTE — Telephone Encounter (Signed)
Call from patient and cost of injections is around $1000 and she does not have drug coverage with her insurance.  Will need to either get the injections here or see if there is funding available.

## 2012-03-16 NOTE — Telephone Encounter (Signed)
Unable to reach.  Message left for patient to call clinic regarding instructions for lovenox.

## 2012-03-16 NOTE — Progress Notes (Signed)
Wendy Myron, NP [address] [place] [address]  1. Breast CA  Schedule Portacath Flush Appointment, heparin lock flush 100 unit/mL, sodium chloride 0.9 % injection 10 mL, CBC with Differential, Comprehensive metabolic panel  2. DVT (deep venous thrombosis)      CURRENT THERAPY: Observation and anticoagulation with Coumadin.  INTERVAL HISTORY: Wendy Gilbert 52 y.o. female returns for  regular  visit for followup of Inflammatory carcinoma the right breast triple negative with positive supraclavicular and infraclavicular lymph nodes axillary nodes and a large eroding mass in the upper outer quadrant of the right breast. The mass was 12 x 11.5 cm with diffuse peau d'orange of changes in the right breast and the right breast was also 2-1/2 times the size of the left breast. She received dose dense FVC for 5 cycles then switched to carboplatin and Taxotere 4 cycles. She then had bilateral mastectomies showing no evidence of disease in the breast and 14 lymph nodes were negative. She was treated with radiation therapy by Dr. Margaretmary Bayley in March of 2011.  She presented here for the first time on 08/06/2008. She is out 3 years from completion of all therapy as of December 2013. AND DVT of the left arm on the site of the Port-A-Cath and she will remain on Coumadin until the Port-A-Cath is removed.    Wendy Gilbert is doing well.  The weather is rainy today and she reports that both of her knees bother her when the weather is moist.   Otherwise, she is doing well.  She denies any issues or complaints.  She still has a decreased energy level, but her labs in July are WNL.  I personally reviewed and went over laboratory results with the patient.  Her Anemia panel and TSH are WNL.  Her Hgb is solid and WNL.   Since she is doing well, we will refer her to Dr. Abbey Chatters in [place] for her port-a-cath removal.  Once her port is removed, she can D/C her Coumadin since her DVT was associated with it.   Once we have that date set for her port to be removed, will manipulate her Coumadin dose accordingly. We will stop her Coumadin 72 hours prior to the procedure.  We will cover her with Lovenox 1.5 mg/kg until 48 hours prior to the procedure.  Will restart Lovenox 24 hours after the procedure x 7 days.  Then D/C all anticoagulation.  She has a daughter who has ADHD and we spent some time discussing that.  Her medications make her not have an appetite, so Wendy Gilbert uses boost to help with her nutrition.   Otherwise, complete ROS questioning is negative.   Past Medical History  Diagnosis Date  . Breast cancer     inflammatory carcinoma of rt breast, triple negative  . DVT (deep venous thrombosis)     hx of left arm vein at the site of catheter  . Diabetes mellitus   . GERD (gastroesophageal reflux disease)   . Sinusitis   . Migraines   . Miscarriage in 1978  . Inflammatory carcinoma of right breast 08/01/2010  . DVT (deep venous thrombosis) 09/14/2010  . Arthritis   . Clotting disorder   . Nasal congestion   . Rash     In various parts of body.     has Inflammatory carcinoma of right breast and DVT (deep venous thrombosis) on her problem list.     is allergic to erythromycin; penicillins; cephalexin; and demerol.  Wendy Gilbert had no medications administered during this visit.  Past Surgical History  Procedure Date  . Mastectomy bilateral  . Knee arthroscopy 11/1996    left - torn cartilage  . Breast surgery 10/2008, 02/2009    double mastectomy  . Cesarean section 09/1999  . Dilation and curettage of uterus     Denies any headaches, dizziness, double vision, fevers, chills, night sweats, nausea, vomiting, diarrhea, constipation, chest pain, heart palpitations, shortness of breath, blood in stool, black tarry stool, urinary pain, urinary burning, urinary frequency, hematuria.   PHYSICAL EXAMINATION  ECOG PERFORMANCE STATUS: 1 - Symptomatic but completely ambulatory  Filed Vitals:    03/16/12 1043  BP: 114/78  Pulse: 111  Temp: 98.1 F (36.7 C)  Resp: 18    GENERAL:alert, well nourished, well developed, comfortable, cooperative, obese and smiling SKIN: skin color, texture, turgor are normal, no rashes or significant lesions HEAD: Normocephalic, No masses, lesions, tenderness or abnormalities EYES: normal, Conjunctiva are pink and non-injected EARS: External ears normal OROPHARYNX:lips, buccal mucosa, and tongue normal and mucous membranes are moist  NECK: supple, no adenopathy, thyroid normal size, non-tender, without nodularity, no stridor, non-tender, trachea midline LYMPH:  no palpable lymphadenopathy, no hepatosplenomegaly BREAST:B/L post-mastectomy site well healed and free of suspicious changes LUNGS: clear to auscultation and percussion HEART: regular rate & rhythm, no murmurs, no gallops, S1 normal and S2 normal ABDOMEN:abdomen soft, non-tender, obese, normal bowel sounds, no masses or organomegaly and no hepatosplenomegaly BACK: Back symmetric, no curvature., No CVA tenderness EXTREMITIES:less then 2 second capillary refill, no joint deformities, effusion, or inflammation, no edema, no skin discoloration, no clubbing, no cyanosis  NEURO: alert & oriented x 3 with fluent speech, no focal motor/sensory deficits, gait normal   LABORATORY DATA: CBC    Component Value Date/Time   WBC 4.5 09/09/2011 1255   RBC 4.65 09/09/2011 1255   HGB 12.7 09/09/2011 1255   HCT 39.0 09/09/2011 1255   PLT 255 09/09/2011 1255   MCV 83.9 09/09/2011 1255   MCH 27.3 09/09/2011 1255   MCHC 32.6 09/09/2011 1255   RDW 14.3 09/09/2011 1255   LYMPHSABS 1.6 09/09/2011 1255   MONOABS 0.3 09/09/2011 1255   EOSABS 0.2 09/09/2011 1255   BASOSABS 0.0 09/09/2011 1255      Chemistry      Component Value Date/Time   NA 141 09/09/2011 1255   K 3.7 09/09/2011 1255   CL 104 09/09/2011 1255   CO2 29 09/09/2011 1255   BUN 13 09/09/2011 1255   CREATININE 0.63 09/09/2011 1255      Component Value  Date/Time   CALCIUM 9.6 09/09/2011 1255   ALKPHOS 89 09/09/2011 1255   AST 22 09/09/2011 1255   ALT 19 09/09/2011 1255   BILITOT 0.3 09/09/2011 1255     Lab Results  Component Value Date   INR 1.92* 02/20/2012   INR 2.38* 01/30/2012   INR 2.29* 01/15/2012       ASSESSMENT:  1. Inflammatory carcinoma the right breast triple negative with positive supraclavicular and infraclavicular lymph nodes axillary nodes and a large eroding mass in the upper outer quadrant of the right breast. The mass was 12 x 11.5 cm with diffuse peau d'orange of changes in the right breast and the right breast was also 2-1/2 times the size of the left breast. She received dose dense FVC for 5 cycles then switched to carboplatin and Taxotere 4 cycles. She then had bilateral mastectomies showing no evidence of disease in the breast and  14 lymph nodes were negative. She was treated with radiation therapy by Dr. Margaretmary Bayley in March of 2011.  She presented here for the first time on 08/06/2008. She is out 3 years from completion of all therapy as of December 2013.  2. DVT of the left arm on the site of the Port-A-Cath and she will remain on Coumadin until the Port-A-Cath is removed.  3. Escherichia coli sepsis after cycle 5 of FEC at which time that drug regimen was stopped and we switched her to carboplatin and Taxotere. 4. Obesity 5. DM   PLAN:  1. I personally reviewed and went over laboratory results with the patient. 2. Labs today: CBC diff, CMET, PT/INR 3. Will refer the patient to Dr. Abbey Chatters for port-a-cath removal.  Once we have a date for that procedure, we can manipulate her Coumadin dosing.  We will hold Coumadin 72 hours prior to Port-a-cath removal procedure.  Will bridge the patient with Lovenox at 1.5 mg/kg until 48 hours prior to the procedure and then restarted 24 hours after procedure and for 7 days.  Then stop all anticoagulation.  She reports that she has some left-over Lovenox at home.  She will check  an expiration date before we call in more doses.   4. I have asked the patient to wait until she is off her Coumadin before starting her Multivitamin since it has Vit K in it.  5. Return in 6 months for follow-up   All questions were answered. The patient knows to call the clinic with any problems, questions or concerns. We can certainly see the patient much sooner if necessary.  The patient and plan discussed with Glenford Peers, MD and he is in agreement with the aforementioned.   Deazia Lampi

## 2012-03-16 NOTE — Progress Notes (Signed)
Wendy Gilbert presented for Portacath access and flush. Proper placement of portacath confirmed by CXR. Portacath located right chest wall accessed with  H 20 needle. Good blood return present. Portacath flushed with 20ml NS and 500U/37ml Heparin and needle removed intact. Procedure without incident. Patient tolerated procedure well.

## 2012-03-16 NOTE — Patient Instructions (Addendum)
[facility] Discharge Instructions  RECOMMENDATIONS MADE BY THE CONSULTANT AND ANY TEST RESULTS WILL BE SENT TO YOUR REFERRING PHYSICIAN.  EXAM FINDINGS BY THE PHYSICIAN TODAY AND SIGNS OR SYMPTOMS TO REPORT TO CLINIC OR PRIMARY PHYSICIAN: Exam and discussion by PA.  We will get an appointment for you to get your port out and will let you know what to do about your coumadin before the port removal.  Wait until after the port is removed before you start taking the vitamins.  MEDICATIONS PRESCRIBED:  none  INSTRUCTIONS GIVEN AND DISCUSSED: Report any new lumps, bone pain or shortness of breath.   SPECIAL INSTRUCTIONS/FOLLOW-UP: Return for follow-up in 6 months.  Thank you for choosing [facility] to provide your oncology and hematology care.  To afford each patient quality time with our providers, please arrive at least 15 minutes before your scheduled appointment time.  With your help, our goal is to use those 15 minutes to complete the necessary work-up to ensure our physicians have the information they need to help with your evaluation and healthcare recommendations.    Effective January 1st, 2014, we ask that you re-schedule your appointment with our physicians should you arrive 10 or more minutes late for your appointment.  We strive to give you quality time with our providers, and arriving late affects you and other patients whose appointments are after yours.    Again, thank you for choosing [facility].  Our hope is that these requests will decrease the amount of time that you wait before being seen by our physicians.       _____________________________________________________________  Should you have questions after your visit to [facility], please contact our office at [phone] between the hours of 8:30 a.m. and 5:00 p.m.  Voicemails left after 4:30 p.m. will not be returned until the following business day.  For  prescription refill requests, have your pharmacy contact our office with your prescription refill request.

## 2012-03-16 NOTE — Telephone Encounter (Signed)
Message copied by Evelena Leyden on Mon Mar 16, 2012  5:42 PM ------      Message from: Ellouise Newer      Created: Mon Mar 16, 2012 11:39 AM       Will refer the patient to Dr. Abbey Chatters for port-a-cath removal.  Once we have a date for that procedure, we can manipulate her Coumadin dosing.  We will hold Coumadin 72 hours prior to Port-a-cath removal procedure.  Will bridge the patient with Lovenox at 1.5 mg/kg until 48 hours prior to the procedure and then restarted 24 hours after procedure and for 7 days.  Then stop all anticoagulation.  She reports that she has some left-over Lovenox at home.  She will check an expiration date before we call in more doses.   She can take her multivitamin when she is on Lovenox, but not when she is taking Coumadin.

## 2012-03-16 NOTE — Telephone Encounter (Signed)
Message copied by Evelena Leyden on Mon Mar 16, 2012  6:14 PM ------      Message from: Ellouise Newer      Created: Mon Mar 16, 2012 11:39 AM       Will refer the patient to Dr. Abbey Chatters for port-a-cath removal.  Once we have a date for that procedure, we can manipulate her Coumadin dosing.  We will hold Coumadin 72 hours prior to Port-a-cath removal procedure.  Will bridge the patient with Lovenox at 1.5 mg/kg until 48 hours prior to the procedure and then restarted 24 hours after procedure and for 7 days.  Then stop all anticoagulation.  She reports that she has some left-over Lovenox at home.  She will check an expiration date before we call in more doses.   She can take her multivitamin when she is on Lovenox, but not when she is taking Coumadin.

## 2012-03-17 NOTE — Telephone Encounter (Signed)
Will have Wendy Gilbert work on this, but if this fails, should we administer here at the clinic?

## 2012-03-17 NOTE — Telephone Encounter (Signed)
Will have Angie work on this, but if this fails, should we administer Lovenox here?

## 2012-03-20 ENCOUNTER — Other Ambulatory Visit (HOSPITAL_COMMUNITY): Payer: Self-pay | Admitting: Oncology

## 2012-03-20 ENCOUNTER — Telehealth (HOSPITAL_COMMUNITY): Payer: Self-pay | Admitting: Oncology

## 2012-03-20 DIAGNOSIS — I82409 Acute embolism and thrombosis of unspecified deep veins of unspecified lower extremity: Secondary | ICD-10-CM

## 2012-03-20 MED ORDER — ENOXAPARIN SODIUM 120 MG/0.8ML ~~LOC~~ SOLN: SUBCUTANEOUS | Status: DC

## 2012-03-23 ENCOUNTER — Ambulatory Visit (INDEPENDENT_AMBULATORY_CARE_PROVIDER_SITE_OTHER): Payer: Medicare Other | Admitting: General Surgery

## 2012-03-23 ENCOUNTER — Encounter (INDEPENDENT_AMBULATORY_CARE_PROVIDER_SITE_OTHER): Payer: Self-pay | Admitting: General Surgery

## 2012-03-23 VITALS — BP 124/82 | HR 112 | Temp 97.3°F | Resp 18 | Ht 63.5 in | Wt 199.0 lb

## 2012-03-23 DIAGNOSIS — Z95828 Presence of other vascular implants and grafts: Secondary | ICD-10-CM

## 2012-03-23 DIAGNOSIS — Z853 Personal history of malignant neoplasm of breast: Secondary | ICD-10-CM

## 2012-03-23 NOTE — Patient Instructions (Signed)
Please call Wendy Gilbert when you have made arrangements for the Lovenox. We can then schedule the surgery.

## 2012-03-23 NOTE — Progress Notes (Signed)
Patient ID: Wendy Gilbert, female   DOB: 1960/09/03, 52 y.o.   MRN: [id number]  Chief Complaint  Patient presents with  . Follow-up    Long-Term/PAC Removal    HPI Wendy Gilbert is a 52 y.o. female.   HPI  She has been sent today to discuss Port-A-Cath removal. She no longer needs this for any treatments. She did have an upper extremity DVT and is on Coumadin for that. She needs to be bridged with Lovenox and she is trying to find a way to pay for that at this time.  Past Medical History  Diagnosis Date  . Breast cancer     inflammatory carcinoma of rt breast, triple negative  . DVT (deep venous thrombosis)     hx of left arm vein at the site of catheter  . Diabetes mellitus   . GERD (gastroesophageal reflux disease)   . Sinusitis   . Migraines   . Miscarriage in 1978  . Inflammatory carcinoma of right breast 08/01/2010  . DVT (deep venous thrombosis) 09/14/2010  . Arthritis   . Clotting disorder   . Nasal congestion   . Rash     In various parts of body.     Past Surgical History  Procedure Laterality Date  . Mastectomy  bilateral  . Knee arthroscopy  11/1996    left - torn cartilage  . Breast surgery  10/2008, 02/2009    double mastectomy  . Cesarean section  09/1999  . Dilation and curettage of uterus      Family History  Problem Relation Age of Onset  . Breast cancer Mother   . Cancer Mother     breast, colon, ovarian  . Heart disease Father   . Cancer Paternal Uncle     pancreatic    Social History History  Substance Use Topics  . Smoking status: Never Smoker   . Smokeless tobacco: Never Used  . Alcohol Use: No    Allergies  Allergen Reactions  . Erythromycin Other (See Comments)    "sick"  . Penicillins Other (See Comments)    Unknown rxn  . Cephalexin Hives and Rash  . Demerol Other (See Comments)    Patient said it made her do things she does not even remember according to nursing staff at hospital.    Current Outpatient Prescriptions  Medication  Sig Dispense Refill  . ibuprofen (ADVIL) 200 MG tablet Take 200 mg by mouth every 6 (six) hours as needed.      . loratadine-pseudoephedrine (CLARITIN-D 12-HOUR) 5-120 MG per tablet Take 1 tablet by mouth as needed.       Marland Kitchen omeprazole (PRILOSEC) 20 MG capsule Take 20 mg by mouth daily.        Marland Kitchen warfarin (COUMADIN) 1 MG tablet Take 1 mg by mouth as directed. Take 2 tablets along with 5 mg. Total Dose-7 mg.      . warfarin (COUMADIN) 5 MG tablet Take 5 mg by mouth daily. Total dosage is 7 mg      . enoxaparin (LOVENOX) 120 MG/0.8ML injection Take the day after stopping Coumadin.  Hold 48 hours prior to surgery.  Restart 24 hours after surgery x 7 days.  8 Syringe  0   No current facility-administered medications for this visit.    Review of Systems Review of Systems  Constitutional: Negative.   Respiratory: Negative.   Cardiovascular: Negative.     Blood pressure 124/82, pulse 112, temperature 97.3 F (36.3 C), temperature source Temporal,  resp. rate 18, height 5' 3.5" (1.613 m), weight 199 lb (90.266 kg).  Physical Exam Physical Exam  Constitutional: No distress.  Overweight female.  Pulmonary/Chest:  Bilateral transverse chest wall scars are noted without nodularity. In the left upper chest, there is a small scar and a palpable Port-A-Cath.    Data Reviewed Notes from the oncology office.  Assessment    Retained Port-A-Cath that is no longer needed for long-term treatment. Also has a DVT in is on Coumadin for that.     Plan    Once arrangements have been made for her to obtain the Lovenox, we can schedule the surgery. I have asked her to call us and let us know when she is able to get the Lovenox.        Crosby Bevan J 03/23/2012, 12:29 PM

## 2012-04-01 ENCOUNTER — Other Ambulatory Visit (HOSPITAL_COMMUNITY): Payer: Self-pay | Admitting: Oncology

## 2012-04-01 DIAGNOSIS — I82409 Acute embolism and thrombosis of unspecified deep veins of unspecified lower extremity: Secondary | ICD-10-CM

## 2012-04-01 MED ORDER — WARFARIN SODIUM 1 MG PO TABS: 1.0000 mg | ORAL_TABLET | ORAL | Status: DC

## 2012-04-03 ENCOUNTER — Telehealth (HOSPITAL_COMMUNITY): Payer: Self-pay

## 2012-04-03 ENCOUNTER — Other Ambulatory Visit (HOSPITAL_COMMUNITY): Payer: Self-pay

## 2012-04-03 MED ORDER — WARFARIN SODIUM 5 MG PO TABS: 5.0000 mg | ORAL_TABLET | Freq: Every day | ORAL | Status: DC

## 2012-04-03 NOTE — Telephone Encounter (Signed)
Patient notified that MD approved 2 refills for coumadin 5 mg.  E-scribed to her pharmacy.

## 2012-04-03 NOTE — Telephone Encounter (Signed)
Refills for coumadin 5 mg e-scribed to her pharmacy.

## 2012-04-20 ENCOUNTER — Other Ambulatory Visit (INDEPENDENT_AMBULATORY_CARE_PROVIDER_SITE_OTHER): Payer: Self-pay | Admitting: General Surgery

## 2012-04-20 ENCOUNTER — Other Ambulatory Visit (HOSPITAL_COMMUNITY): Payer: Self-pay

## 2012-04-20 ENCOUNTER — Telehealth (INDEPENDENT_AMBULATORY_CARE_PROVIDER_SITE_OTHER): Payer: Self-pay | Admitting: *Deleted

## 2012-04-20 DIAGNOSIS — I82402 Acute embolism and thrombosis of unspecified deep veins of left lower extremity: Secondary | ICD-10-CM

## 2012-04-20 MED ORDER — WARFARIN SODIUM 1 MG PO TABS: 1.0000 mg | ORAL_TABLET | ORAL | Status: DC

## 2012-04-20 NOTE — Telephone Encounter (Signed)
Attempted to call patient but patient was unavailable (still in [place]).  Found documentation for instructions for Lovenox to be "Take the day after stopping Coumadin. Hold 48 hours prior to surgery.  Restart 24 hours after surgery x 7 days."

## 2012-04-20 NOTE — Telephone Encounter (Signed)
Patient called back regarding clarifying orders regarding her Coumadin and Lovenox.  Read to patient note from [facility] PA from 03/16/12.  "Will refer the patient to Dr. Abbey Chatters for port-a-cath removal. Once we have a date for that procedure, we can manipulate her Coumadin dosing. We will hold Coumadin 72 hours prior to Port-a-cath removal procedure. Will bridge the patient with Lovenox at 1.5 mg/kg until 48 hours prior to the procedure and then restarted 24 hours after procedure and for 7 days. Then stop all anticoagulation. She reports that she has some left-over Lovenox at home. She will check an expiration date before we call in more doses."  Patient states understanding as this time.

## 2012-04-27 ENCOUNTER — Other Ambulatory Visit (HOSPITAL_COMMUNITY): Payer: Medicare Other

## 2012-05-01 ENCOUNTER — Encounter (HOSPITAL_BASED_OUTPATIENT_CLINIC_OR_DEPARTMENT_OTHER): Payer: Self-pay | Admitting: *Deleted

## 2012-05-01 ENCOUNTER — Telehealth (HOSPITAL_COMMUNITY): Payer: Self-pay

## 2012-05-01 NOTE — Telephone Encounter (Signed)
D/C Coumadin now.  Start Lovenox 24 hours after port removal x 5 days, then stop.  If she wishes, she can bring the left over Lovenox to [place] to dispose.

## 2012-05-01 NOTE — Pre-Procedure Instructions (Signed)
To come for CBC, diff, BMET, PT, PTT

## 2012-05-01 NOTE — Telephone Encounter (Signed)
Patient has surgery scheduled for Thursday, 3/27.  Has 10 Lovenox 120 mg injections.  Wants to know when to stop coumadin, when to start and stop lovenox and what dosage of lovenox she needs to take.

## 2012-05-01 NOTE — Telephone Encounter (Signed)
Tmya C Yepez contacted and advised as below per T. Kefalas, PA-C:  Wendy Gilbert instructed to d/c Coumadin today and to begin Lovenox 24 hours after surgery and then for 5 days.  Asked her to call us at the end of her lovenox.  Wendy Gilbert verbalized understanding of all.

## 2012-05-04 ENCOUNTER — Encounter (HOSPITAL_BASED_OUTPATIENT_CLINIC_OR_DEPARTMENT_OTHER)
Admission: RE | Admit: 2012-05-04 | Discharge: 2012-05-04 | Disposition: A | Discharge status: Home / Self Care | Payer: Medicare Other | Source: Ambulatory Visit | Attending: General Surgery | Admitting: General Surgery

## 2012-05-04 LAB — CBC WITH DIFFERENTIAL/PLATELET
Basophils Absolute: 0.1 10*3/uL (ref 0.0–0.1)
Basophils Relative: 1 % (ref 0–1)
Lymphocytes Relative: 39 % (ref 12–46)
MCHC: 33.8 g/dL (ref 30.0–36.0)
Neutro Abs: 3.2 10*3/uL (ref 1.7–7.7)
Neutrophils Relative %: 50 % (ref 43–77)
Platelets: 236 10*3/uL (ref 150–400)
RDW: 14.4 % (ref 11.5–15.5)
WBC: 6.4 10*3/uL (ref 4.0–10.5)

## 2012-05-04 LAB — PROTIME-INR
INR: 1.25 (ref 0.00–1.49)
Prothrombin Time: 15.5 seconds — ABNORMAL HIGH (ref 11.6–15.2)

## 2012-05-04 LAB — BASIC METABOLIC PANEL
BUN: 16 mg/dL (ref 6–23)
Creatinine, Ser: 0.65 mg/dL (ref 0.50–1.10)
GFR calc Af Amer: >90 mL/min (ref >90)
GFR calc non Af Amer: >90 mL/min (ref >90)

## 2012-05-04 LAB — APTT: aPTT: 32 seconds (ref 24–37)

## 2012-05-07 ENCOUNTER — Encounter (HOSPITAL_BASED_OUTPATIENT_CLINIC_OR_DEPARTMENT_OTHER): Payer: Self-pay | Admitting: Anesthesiology

## 2012-05-07 ENCOUNTER — Ambulatory Visit (HOSPITAL_BASED_OUTPATIENT_CLINIC_OR_DEPARTMENT_OTHER)
Admission: RE | Admit: 2012-05-07 | Discharge: 2012-05-07 | Disposition: A | Discharge status: Home / Self Care | Payer: Medicare Other | Source: Ambulatory Visit | Attending: General Surgery | Admitting: General Surgery

## 2012-05-07 ENCOUNTER — Encounter (HOSPITAL_BASED_OUTPATIENT_CLINIC_OR_DEPARTMENT_OTHER)
Admission: RE | Disposition: A | Discharge status: Home / Self Care | Payer: Self-pay | Source: Ambulatory Visit | Attending: General Surgery

## 2012-05-07 ENCOUNTER — Ambulatory Visit (HOSPITAL_BASED_OUTPATIENT_CLINIC_OR_DEPARTMENT_OTHER): Payer: Medicare Other | Admitting: Anesthesiology

## 2012-05-07 ENCOUNTER — Encounter (HOSPITAL_BASED_OUTPATIENT_CLINIC_OR_DEPARTMENT_OTHER): Payer: Self-pay | Admitting: *Deleted

## 2012-05-07 DIAGNOSIS — K219 Gastro-esophageal reflux disease without esophagitis: Secondary | ICD-10-CM | POA: Insufficient documentation

## 2012-05-07 DIAGNOSIS — Z86718 Personal history of other venous thrombosis and embolism: Secondary | ICD-10-CM | POA: Insufficient documentation

## 2012-05-07 DIAGNOSIS — Z853 Personal history of malignant neoplasm of breast: Secondary | ICD-10-CM | POA: Insufficient documentation

## 2012-05-07 DIAGNOSIS — Z7901 Long term (current) use of anticoagulants: Secondary | ICD-10-CM | POA: Insufficient documentation

## 2012-05-07 DIAGNOSIS — Z452 Encounter for adjustment and management of vascular access device: Secondary | ICD-10-CM

## 2012-05-07 DIAGNOSIS — M129 Arthropathy, unspecified: Secondary | ICD-10-CM | POA: Insufficient documentation

## 2012-05-07 DIAGNOSIS — Z9010 Acquired absence of unspecified breast and nipple: Secondary | ICD-10-CM | POA: Insufficient documentation

## 2012-05-07 HISTORY — DX: Nausea with vomiting, unspecified: R11.2

## 2012-05-07 HISTORY — DX: Other seasonal allergic rhinitis: J30.2

## 2012-05-07 HISTORY — DX: Headache: R51

## 2012-05-07 HISTORY — PX: PORT-A-CATH REMOVAL: SHX5289

## 2012-05-07 HISTORY — DX: Other specified postprocedural states: Z98.890

## 2012-05-07 SURGERY — REMOVAL PORT-A-CATH
Anesthesia: Monitor Anesthesia Care | Laterality: Left | Wound class: Clean

## 2012-05-07 MED ORDER — VANCOMYCIN HCL IN DEXTROSE 1-5 GM/200ML-% IV SOLN
1000.0000 mg | INTRAVENOUS | Status: AC
  Administered 2012-05-07: 1000 mg via INTRAVENOUS

## 2012-05-07 MED ORDER — HYDROMORPHONE HCL PF 1 MG/ML IJ SOLN
0.2500 mg | INTRAMUSCULAR | Status: DC | PRN
  Administered 2012-05-07: 0.5 mg via INTRAVENOUS

## 2012-05-07 MED ORDER — ACETAMINOPHEN 650 MG RE SUPP: 650.0000 mg | RECTAL | Status: DC | PRN

## 2012-05-07 MED ORDER — ONDANSETRON HCL 4 MG/2ML IJ SOLN: 4.0000 mg | Freq: Four times a day (QID) | INTRAMUSCULAR | Status: DC | PRN

## 2012-05-07 MED ORDER — OXYCODONE HCL 5 MG/5ML PO SOLN: 5.0000 mg | Freq: Once | ORAL | Status: DC | PRN

## 2012-05-07 MED ORDER — SODIUM CHLORIDE 0.9 % IJ SOLN: 3.0000 mL | INTRAMUSCULAR | Status: DC | PRN

## 2012-05-07 MED ORDER — TRAMADOL HCL 50 MG PO TABS: 50.0000 mg | ORAL_TABLET | Freq: Four times a day (QID) | ORAL | Status: DC | PRN

## 2012-05-07 MED ORDER — FENTANYL CITRATE 0.05 MG/ML IJ SOLN
INTRAMUSCULAR | Status: DC | PRN
  Administered 2012-05-07: 50 ug via INTRAVENOUS

## 2012-05-07 MED ORDER — ONDANSETRON HCL 4 MG/2ML IJ SOLN: 4.0000 mg | Freq: Once | INTRAMUSCULAR | Status: DC | PRN

## 2012-05-07 MED ORDER — ONDANSETRON HCL 4 MG/2ML IJ SOLN
INTRAMUSCULAR | Status: DC | PRN
  Administered 2012-05-07: 4 mg via INTRAVENOUS

## 2012-05-07 MED ORDER — MIDAZOLAM HCL 2 MG/2ML IJ SOLN: 1.0000 mg | INTRAMUSCULAR | Status: DC | PRN

## 2012-05-07 MED ORDER — MIDAZOLAM HCL 5 MG/5ML IJ SOLN
INTRAMUSCULAR | Status: DC | PRN
  Administered 2012-05-07: 2 mg via INTRAVENOUS

## 2012-05-07 MED ORDER — OXYCODONE HCL 5 MG PO TABS: 5.0000 mg | ORAL_TABLET | Freq: Once | ORAL | Status: DC | PRN

## 2012-05-07 MED ORDER — ACETAMINOPHEN 325 MG PO TABS: 650.0000 mg | ORAL_TABLET | ORAL | Status: DC | PRN

## 2012-05-07 MED ORDER — FENTANYL CITRATE 0.05 MG/ML IJ SOLN: 50.0000 ug | INTRAMUSCULAR | Status: DC | PRN

## 2012-05-07 MED ORDER — LIDOCAINE HCL (CARDIAC) 20 MG/ML IV SOLN
INTRAVENOUS | Status: DC | PRN
  Administered 2012-05-07: 50 mg via INTRAVENOUS

## 2012-05-07 MED ORDER — PROPOFOL 10 MG/ML IV EMUL
INTRAVENOUS | Status: DC | PRN
  Administered 2012-05-07: 75 ug/kg/min via INTRAVENOUS

## 2012-05-07 MED ORDER — MIDAZOLAM HCL 2 MG/ML PO SYRP: 12.0000 mg | ORAL_SOLUTION | Freq: Once | ORAL | Status: DC | PRN

## 2012-05-07 MED ORDER — MEPERIDINE HCL 25 MG/ML IJ SOLN: 6.2500 mg | INTRAMUSCULAR | Status: DC | PRN

## 2012-05-07 MED ORDER — LIDOCAINE HCL 1 % IJ SOLN
INTRAMUSCULAR | Status: DC | PRN
  Administered 2012-05-07: 10 mL

## 2012-05-07 MED ORDER — OXYCODONE HCL 5 MG PO TABS: 5.0000 mg | ORAL_TABLET | ORAL | Status: DC | PRN

## 2012-05-07 MED ORDER — LACTATED RINGERS IV SOLN
INTRAVENOUS | Status: DC
  Administered 2012-05-07: 09:00:00 via INTRAVENOUS

## 2012-05-07 SURGICAL SUPPLY — 41 items
ADH SKN CLS APL DERMABOND .7 (GAUZE/BANDAGES/DRESSINGS) ×1
APL SKNCLS STERI-STRIP NONHPOA (GAUZE/BANDAGES/DRESSINGS)
BENZOIN TINCTURE PRP APPL 2/3 (GAUZE/BANDAGES/DRESSINGS) ×1 IMPLANT
BLADE SURG 15 STRL LF DISP TIS (BLADE) ×1 IMPLANT
BLADE SURG 15 STRL SS (BLADE) ×2
CHLORAPREP W/TINT 26ML (MISCELLANEOUS) ×2 IMPLANT
CLEANER CAUTERY TIP 5X5 PAD (MISCELLANEOUS) ×1 IMPLANT
CLOTH BEACON ORANGE TIMEOUT ST (SAFETY) ×2 IMPLANT
COVER MAYO STAND STRL (DRAPES) ×2 IMPLANT
COVER TABLE BACK 60X90 (DRAPES) ×2 IMPLANT
DECANTER SPIKE VIAL GLASS SM (MISCELLANEOUS) ×2 IMPLANT
DERMABOND ADVANCED (GAUZE/BANDAGES/DRESSINGS) ×1
DERMABOND ADVANCED .7 DNX12 (GAUZE/BANDAGES/DRESSINGS) IMPLANT
DRAPE PED LAPAROTOMY (DRAPES) ×2 IMPLANT
DRAPE UTILITY XL STRL (DRAPES) IMPLANT
DRESSING TELFA 8X3 (GAUZE/BANDAGES/DRESSINGS) ×2 IMPLANT
DRSG TEGADERM 2-3/8X2-3/4 SM (GAUZE/BANDAGES/DRESSINGS) IMPLANT
ELECT REM PT RETURN 9FT ADLT (ELECTROSURGICAL) ×2
ELECTRODE REM PT RTRN 9FT ADLT (ELECTROSURGICAL) ×1 IMPLANT
GLOVE BIOGEL M 7.0 STRL (GLOVE) ×1 IMPLANT
GLOVE BIOGEL PI IND STRL 7.5 (GLOVE) IMPLANT
GLOVE BIOGEL PI IND STRL 8.5 (GLOVE) ×1 IMPLANT
GLOVE BIOGEL PI INDICATOR 7.5 (GLOVE) ×1
GLOVE BIOGEL PI INDICATOR 8.5 (GLOVE) ×1
GLOVE ECLIPSE 8.0 STRL XLNG CF (GLOVE) ×2 IMPLANT
GLOVE INDICATOR 7.0 STRL GRN (GLOVE) ×1 IMPLANT
GOWN PREVENTION PLUS XLARGE (GOWN DISPOSABLE) ×3 IMPLANT
NDL HYPO 25X1 1.5 SAFETY (NEEDLE) ×1 IMPLANT
NEEDLE HYPO 25X1 1.5 SAFETY (NEEDLE) ×2 IMPLANT
PACK BASIN DAY SURGERY FS (CUSTOM PROCEDURE TRAY) ×2 IMPLANT
PAD CLEANER CAUTERY TIP 5X5 (MISCELLANEOUS) ×1
PENCIL BUTTON HOLSTER BLD 10FT (ELECTRODE) ×2 IMPLANT
SLEEVE SCD COMPRESS KNEE MED (MISCELLANEOUS) IMPLANT
SPONGE GAUZE 2X2 8PLY STRL LF (GAUZE/BANDAGES/DRESSINGS) ×2 IMPLANT
STRIP CLOSURE SKIN 1/2X4 (GAUZE/BANDAGES/DRESSINGS) ×1 IMPLANT
SUT MON AB 4-0 PC3 18 (SUTURE) ×2 IMPLANT
SUT VIC AB 4-0 SH 27 (SUTURE) ×2
SUT VIC AB 4-0 SH 27XANBCTRL (SUTURE) ×1 IMPLANT
SYR CONTROL 10ML LL (SYRINGE) ×2 IMPLANT
TOWEL OR 17X24 6PK STRL BLUE (TOWEL DISPOSABLE) ×3 IMPLANT
TOWEL OR NON WOVEN STRL DISP B (DISPOSABLE) ×2 IMPLANT

## 2012-05-07 NOTE — Transfer of Care (Signed)
Immediate Anesthesia Transfer of Care Note  Patient: Wendy Gilbert  Procedure(s) Performed: Procedure(s): REMOVAL PORT-A-CATH (Left)  Patient Location: PACU  Anesthesia Type:MAC  Level of Consciousness: awake, alert  and oriented  Airway & Oxygen Therapy: Patient Spontanous Breathing and Patient connected to face mask oxygen  Post-op Assessment: Report given to PACU RN and Post -op Vital signs reviewed and stable  Post vital signs: Reviewed and stable  Complications: No apparent anesthesia complications

## 2012-05-07 NOTE — Anesthesia Preprocedure Evaluation (Signed)
Anesthesia Evaluation  Patient identified by MRN, date of birth, ID band Patient awake    Reviewed: Allergy & Precautions, H&P , NPO status , Patient's Chart, lab work & pertinent test results  History of Anesthesia Complications (+) PONV  Airway Mallampati: I TM Distance: >3 FB Neck ROM: Full    Dental   Pulmonary          Cardiovascular     Neuro/Psych    GI/Hepatic GERD-  Medicated and Controlled,  Endo/Other    Renal/GU      Musculoskeletal   Abdominal   Peds  Hematology   Anesthesia Other Findings   Reproductive/Obstetrics                           Anesthesia Physical Anesthesia Plan  ASA: II  Anesthesia Plan: MAC   Post-op Pain Management:    Induction: Intravenous  Airway Management Planned: Simple Face Mask  Additional Equipment:   Intra-op Plan:   Post-operative Plan:   Informed Consent: I have reviewed the patients History and Physical, chart, labs and discussed the procedure including the risks, benefits and alternatives for the proposed anesthesia with the patient or authorized representative who has indicated his/her understanding and acceptance.     Plan Discussed with: CRNA and Surgeon  Anesthesia Plan Comments:         Anesthesia Quick Evaluation

## 2012-05-07 NOTE — H&P (Signed)
Wendy Gilbert is an 52 y.o. female.   Chief Complaint:   Here for Port-a-cath removal HPI: She has an indwelling Port-a-cath that is no longer needed and she presents for removal.  Past Medical History  Diagnosis Date  . DVT (deep venous thrombosis) 10/2008    left arm  . GERD (gastroesophageal reflux disease)   . Sinusitis   . PONV (postoperative nausea and vomiting)   . Headache     sinus  . Arthritis     "all over"  . Inflammatory carcinoma of right breast 2010  . Seasonal allergies     Past Surgical History  Procedure Laterality Date  . Knee arthroscopy Left 11/1996  . Cesarean section  09/1999  . Dilation and curettage of uterus  1978  . Portacath placement  08/09/2008  . Incision and drainage abscess  10/25/2008    and debridement left axillary abscess, abd. wall abscess, left thigh abscess  . Mastectomy Bilateral 02/23/2009    right modified radical, left simple    Family History  Problem Relation Age of Onset  . Breast cancer Mother   . Cancer Mother     breast, colon, ovarian  . Heart disease Father   . Cancer Paternal Uncle     pancreatic   Social History:  reports that she has never smoked. She has never used smokeless tobacco. She reports that she does not drink alcohol or use illicit drugs.  Allergies:  Allergies  Allergen Reactions  . Demerol Other (See Comments)    MENTAL STATUS CHANGE  . Erythromycin Nausea And Vomiting  . Adhesive (Tape) Rash  . Cephalexin Hives and Other (See Comments)    ABD. PAIN  . Penicillins Other (See Comments)    UNKNOWN - WAS AS A CHILD    Medications Prior to Admission  Medication Sig Dispense Refill  . ibuprofen (ADVIL) 200 MG tablet Take 200 mg by mouth every 6 (six) hours as needed.      . loratadine-pseudoephedrine (CLARITIN-D 12-HOUR) 5-120 MG per tablet Take 1 tablet by mouth as needed.       Marland Kitchen omeprazole (PRILOSEC) 20 MG capsule Take 20 mg by mouth daily.        Marland Kitchen warfarin (COUMADIN) 5 MG tablet Take 1 tablet (5  mg total) by mouth daily. Total dosage is 7 mg  30 tablet  2  . enoxaparin (LOVENOX) 120 MG/0.8ML injection Take the day after stopping Coumadin.  Hold 48 hours prior to surgery.  Restart 24 hours after surgery x 7 days.  8 Syringe  0  . warfarin (COUMADIN) 1 MG tablet Take 1 tablet (1 mg total) by mouth as directed. Take 2 tablets along with 5 mg. Total Dose-7 mg.  30 tablet  2    No results found for this or any previous visit (from the past 48 hour(s)). No results found.  Review of Systems  Constitutional: Negative for fever and chills.  HENT: Negative for congestion and sore throat.     Blood pressure 129/83, pulse 82, temperature 97.8 F (36.6 C), temperature source Oral, resp. rate 20, height 5\' 3"  (1.6 m), weight 200 lb (90.719 kg), SpO2 97.00%. Physical Exam  Constitutional: No distress.  Overweight  Cardiovascular: Normal rate and regular rhythm.   Respiratory: Effort normal and breath sounds normal.  Bilateral chest wall scars.  Palpable Port-a-cath in left upper chest wall.     Assessment/Plan Retained Port-a-cath.  Plan:  Port-a-cath removal.  Verner Kopischke J 05/07/2012, 8:47 AM

## 2012-05-07 NOTE — Op Note (Signed)
PREOPERATIVE DIAGNOSIS:  Retain Port-a-cath.  POSTOPERATIVE DIAGNOSIS:  Same  PROCEDURE:    Porta-cath removal  SURGEON:  Avel Peace, M.D.  ANESTHESIA:  Local with MAC  INDICATION:  This is a 52 year old female/female with breast cancer who has received chemotherapy via the Porta-cath.  The Porta-cath is no longer needed.  She now presents for removal.  The procedure, risks, and aftercare have been explained preoperatively.   The left upper chest wall and neck were sterilely prepped and draped. Local anesthetic was infiltrated at the site of the port in the chest wall superficially and deep. The previous scar was incised sharply and then using electrocautery the subcutaneous tissue was divided until the port and catheter were identified.  The fibrous sheath was dissected free from the catheter and it was pulled out of the left subclavian vein. Direct pressure was held over the vein site for 10-15 minutes.  The port was then dissected free from the chest wall using electrocautery. The port and catheter were removed intact. The chest wall area was inspected and bleeding controlled with electrocautery. Once hemostasis was adequate, the chest wall incision was closed in 2 layers. The subcutaneous tissues approximated with running 4-0 Vicryl suture. The skin was closed with a running 4-0 Monocryl subcuticular stitch. Dermabond and a sterile dressing were applied.  She tolerated the procedure well without any apparent complications and was taken to the recovery room in satisfactory condition.

## 2012-05-07 NOTE — Anesthesia Postprocedure Evaluation (Signed)
Anesthesia Post Note  Patient: Wendy Gilbert  Procedure(s) Performed: Procedure(s) (LRB): REMOVAL PORT-A-CATH (Left)  Anesthesia type: general  Patient location: PACU  Post pain: Pain level controlled  Post assessment: Patient's Cardiovascular Status Stable  Last Vitals:  Filed Vitals:   05/07/12 0945  BP: 118/86  Pulse: 85  Temp:   Resp: 19    Post vital signs: Reviewed and stable  Level of consciousness: sedated  Complications: No apparent anesthesia complications

## 2012-05-08 ENCOUNTER — Encounter (HOSPITAL_BASED_OUTPATIENT_CLINIC_OR_DEPARTMENT_OTHER): Payer: Self-pay | Admitting: General Surgery

## 2012-06-01 ENCOUNTER — Encounter (INDEPENDENT_AMBULATORY_CARE_PROVIDER_SITE_OTHER): Payer: Self-pay | Admitting: General Surgery

## 2012-06-01 ENCOUNTER — Ambulatory Visit (INDEPENDENT_AMBULATORY_CARE_PROVIDER_SITE_OTHER): Payer: Medicare Other | Admitting: General Surgery

## 2012-06-01 VITALS — BP 120/84 | HR 93 | Temp 96.6°F | Ht 63.0 in | Wt 200.4 lb

## 2012-06-01 DIAGNOSIS — Z9889 Other specified postprocedural states: Secondary | ICD-10-CM

## 2012-06-01 NOTE — Progress Notes (Signed)
Procedure:   Port-A-Cath removal  Date:  05/08/2038  Pathology: na  History:  She is here for her first postoperative visit and was doing well.  Exam: General- Is in NAD. Left chest wall-incision is clean and intact.  Assessment:Wound healing well following Port-A-Cath removal.  Plan:Return as needed.

## 2012-06-01 NOTE — Patient Instructions (Signed)
Avoid direct sunlight on the scar for 2 months.

## 2012-09-14 ENCOUNTER — Encounter (HOSPITAL_COMMUNITY): Payer: Medicare Other | Attending: Internal Medicine

## 2012-09-14 VITALS — BP 125/88 | HR 82 | Temp 98.5°F | Resp 18 | Wt 202.0 lb

## 2012-09-14 DIAGNOSIS — C778 Secondary and unspecified malignant neoplasm of lymph nodes of multiple regions: Secondary | ICD-10-CM

## 2012-09-14 DIAGNOSIS — Z853 Personal history of malignant neoplasm of breast: Secondary | ICD-10-CM | POA: Insufficient documentation

## 2012-09-14 DIAGNOSIS — C50419 Malignant neoplasm of upper-outer quadrant of unspecified female breast: Secondary | ICD-10-CM

## 2012-09-14 DIAGNOSIS — C50919 Malignant neoplasm of unspecified site of unspecified female breast: Secondary | ICD-10-CM

## 2012-09-14 DIAGNOSIS — Z171 Estrogen receptor negative status [ER-]: Secondary | ICD-10-CM

## 2012-09-14 DIAGNOSIS — Z09 Encounter for follow-up examination after completed treatment for conditions other than malignant neoplasm: Secondary | ICD-10-CM | POA: Insufficient documentation

## 2012-09-14 LAB — CBC
Platelets: 249 10*3/uL (ref 150–400)
RBC: 4.71 MIL/uL (ref 3.87–5.11)
WBC: 5.2 10*3/uL (ref 4.0–10.5)

## 2012-09-14 LAB — HEPATIC FUNCTION PANEL: Total Protein: 8.1 g/dL (ref 6.0–8.3)

## 2012-09-14 LAB — CREATININE, SERUM
Creatinine, Ser: 0.76 mg/dL (ref 0.50–1.10)
GFR calc Af Amer: >90 mL/min (ref >90)
GFR calc non Af Amer: >90 mL/min (ref >90)

## 2012-09-14 NOTE — Patient Instructions (Signed)
.  [facility] Discharge Instructions  RECOMMENDATIONS MADE BY THE CONSULTANT AND ANY TEST RESULTS WILL BE SENT TO YOUR REFERRING PHYSICIAN.  EXAM FINDINGS BY THE PHYSICIAN TODAY AND SIGNS OR SYMPTOMS TO REPORT TO CLINIC OR PRIMARY PHYSICIAN: exam good  Notify us of any new lumps or bumps    SPECIAL INSTRUCTIONS/FOLLOW-UP: Labs today and in 6 months  Thank you for choosing [facility] to provide your oncology and hematology care.  To afford each patient quality time with our providers, please arrive at least 15 minutes before your scheduled appointment time.  With your help, our goal is to use those 15 minutes to complete the necessary work-up to ensure our physicians have the information they need to help with your evaluation and healthcare recommendations.    Effective January 1st, 2014, we ask that you re-schedule your appointment with our physicians should you arrive 10 or more minutes late for your appointment.  We strive to give you quality time with our providers, and arriving late affects you and other patients whose appointments are after yours.    Again, thank you for choosing [facility].  Our hope is that these requests will decrease the amount of time that you wait before being seen by our physicians.       _____________________________________________________________  Should you have questions after your visit to [facility], please contact our office at [phone] between the hours of 8:30 a.m. and 5:00 p.m.  Voicemails left after 4:30 p.m. will not be returned until the following business day.  For prescription refill requests, have your pharmacy contact our office with your prescription refill request.

## 2012-09-14 NOTE — Progress Notes (Signed)
Wendy Heap, MD [address] [place] [address]  DIAGNOSIS:  1. h/o stage IIIC Inflammatory carcinoma the right breast triple negative with positive supraclavicular and infraclavicular lymph nodes axillary nodes and a large eroding mass in the upper outer quadrant of the right breast. The mass was 12 x 11.5 cm with diffuse peau d'orange of changes in the right breast and the right breast was also 2-1/2 times the size of the left breast. She received dose dense FVC for 5 cycles then switched to carboplatin and Taxotere 4 cycles. She then had bilateral mastectomies showing no evidence of disease in the breast and 14 lymph nodes were negative. She was treated with radiation therapy by Dr. Margaretmary Bayley in March of 2011.  She presented here for the first time on 08/06/2008. She is out 3 years from completion of all therapy as of December 2013.  2. h/o DVT of the left arm on the site of the Port-A-Cath and PICC line. Port-A-Cath was removed early 2014. Status post anticoagulation.  CURRENT THERAPY: Observation/surveillance.  HPI: Wendy Gilbert 52 y.o. female returns for continued oncology evaluation for surveillance of breast cancer history as described above. She was seen here about 5-6 months ago. Overall states that she is doing well and denies any new complaints. Eating well, appetite remains a steady. Remains physically active and ambulatory. Denies feeling any new chest wall nodules or masses on self-exam. No new bone pains. No headaches, imbalance or falls. Denies any recurrent swelling or pain in extremities. Denies any recurrent thromboembolic phenomena since last visit here. No new mood disturbances.     Past Medical History  Diagnosis Date  . DVT (deep venous thrombosis) 10/2008    left arm  . GERD (gastroesophageal reflux disease)   . Sinusitis   . PONV (postoperative nausea and vomiting)   . Headache(784.0)     sinus  . Arthritis     "all over"  . Inflammatory carcinoma of right  breast 2010  . Seasonal allergies     has Inflammatory carcinoma of right breast and DVT (deep venous thrombosis) on her problem list.     is allergic to demerol; erythromycin; adhesive; cephalexin; and penicillins.  Ms. Niemeier does not currently have medications on file.  Past Surgical History  Procedure Laterality Date  . Knee arthroscopy Left 11/1996  . Cesarean section  09/1999  . Dilation and curettage of uterus  1978  . Portacath placement  08/09/2008  . Incision and drainage abscess  10/25/2008    and debridement left axillary abscess, abd. wall abscess, left thigh abscess  . Mastectomy Bilateral 02/23/2009    right modified radical, left simple  . Port-a-cath removal Left 05/07/2012    Procedure: REMOVAL PORT-A-CATH;  Surgeon: Adolph Pollack, MD;  Location: [facility];  Service: General;  Laterality: Left;    ROS: as in HPI above. In addition, denies fevers or night sweats. Denies any headaches, dizziness, double vision, fevers, chills, night sweats, nausea, vomiting, diarrhea, constipation, chest pain, heart palpitations, shortness of breath, blood in stool, black tarry stool, urinary pain, urinary burning, urinary frequency, hematuria. PS ECOG 1.   PHYSICAL EXAMINATION  ECOG PERFORMANCE STATUS: 1 - Symptomatic but completely ambulatory  Filed Vitals:   09/14/12 0906  BP: 125/88  Pulse: 82  Temp:   Resp: 18    GENERAL: Patient is alert and oriented, in no acute distress. No icterus or pallor. HEENT : EOMs intact. No oral thrush. No cervical or supraclavicular  adenopathy. CVS : S1-S2, regular rate and rhythm, no murmurs. LUNGS : Bilaterally clear to auscultation, no crepitations or rhonchi ABDOMEN: Soft, nontender, no hepatomegaly. EXTREMITIES: No edema or cyanosis BREAST: No abnormal nodules or masses palpable in bilateral chest wall area. No axillary adenopathy on either side. Exam performed in presence of a nurse.  NEURO: Cranial nerves intact,  grossly nonfocal.   LABORATORY DATA: CBC    Component Value Date/Time   WBC 6.4 05/04/2012 1000   RBC 4.92 05/04/2012 1000   HGB 13.4 05/04/2012 1000   HCT 39.6 05/04/2012 1000   PLT 236 05/04/2012 1000   MCV 80.5 05/04/2012 1000   MCH 27.2 05/04/2012 1000   MCHC 33.8 05/04/2012 1000   RDW 14.4 05/04/2012 1000   LYMPHSABS 2.5 05/04/2012 1000   MONOABS 0.5 05/04/2012 1000   EOSABS 0.2 05/04/2012 1000   BASOSABS 0.1 05/04/2012 1000      Chemistry      Component Value Date/Time   NA 139 05/04/2012 1000   K 4.3 05/04/2012 1000   CL 103 05/04/2012 1000   CO2 23 05/04/2012 1000   BUN 16 05/04/2012 1000   CREATININE 0.65 05/04/2012 1000      Component Value Date/Time   CALCIUM 9.7 05/04/2012 1000   ALKPHOS 75 03/16/2012 1110   AST 22 03/16/2012 1110   ALT 18 03/16/2012 1110   BILITOT 0.2* 03/16/2012 1110     Lab Results  Component Value Date   INR 1.25 05/04/2012   INR 2.29* 03/16/2012   INR 1.92* 02/20/2012       ASSESSMENT / PLAN:  1. Inflammatory carcinoma the right breast triple negative with positive supraclavicular and infraclavicular lymph nodes axillary nodes and a large eroding mass in the upper outer quadrant of the right breast. The mass was 12 x 11.5 cm with diffuse peau d'orange of changes in the right breast and the right breast was also 2-1/2 times the size of the left breast. She received dose dense FVC for 5 cycles then switched to carboplatin and Taxotere 4 cycles. She then had bilateral mastectomies showing no evidence of disease in the breast and 14 lymph nodes were negative. She was treated with radiation therapy by Dr. Margaretmary Bayley in March of 2011 -  patient is doing well with no clinical evidence to suggest recurrent/metastatic breast cancer. Appetite and weight are steady. No abnormal masses or nodules in the chest wall on exam today, no axillary or supraclavicular adenopathy. Will draw labs today including CBC, creatinine, LFT, serum CA 27-29 level. If these are unremarkable, plan  is to continue surveillance/observation and she will return for MD followup at 6 months from now with repeat labs.   2. H/o DVT of the left arm on the site of the Port-A-Cath -  Port-A-Cath has been removed, she is status post anticoagulation. No recurrent symptoms or signs of thromboembolic phenomena at this time.   In between visits, patient was to call or come to ER in case of any new chest wall masses felt on self-exam, new symptoms or acute sickness. She is agreeable to this plan.    Zahra Peffley

## 2012-11-10 ENCOUNTER — Encounter (INDEPENDENT_AMBULATORY_CARE_PROVIDER_SITE_OTHER): Payer: Self-pay

## 2012-11-27 ENCOUNTER — Ambulatory Visit (INDEPENDENT_AMBULATORY_CARE_PROVIDER_SITE_OTHER): Payer: Medicare Other | Admitting: *Deleted

## 2012-11-27 DIAGNOSIS — Z23 Encounter for immunization: Secondary | ICD-10-CM

## 2013-03-17 ENCOUNTER — Encounter (HOSPITAL_COMMUNITY): Payer: Self-pay

## 2013-03-17 ENCOUNTER — Encounter (HOSPITAL_COMMUNITY): Payer: Medicare Other

## 2013-03-17 ENCOUNTER — Encounter (HOSPITAL_COMMUNITY): Payer: Medicare Other | Attending: Hematology and Oncology

## 2013-03-17 VITALS — BP 132/88 | HR 119 | Temp 97.7°F | Resp 20 | Wt 197.6 lb

## 2013-03-17 DIAGNOSIS — Z923 Personal history of irradiation: Secondary | ICD-10-CM | POA: Insufficient documentation

## 2013-03-17 DIAGNOSIS — Z86718 Personal history of other venous thrombosis and embolism: Secondary | ICD-10-CM | POA: Insufficient documentation

## 2013-03-17 DIAGNOSIS — M129 Arthropathy, unspecified: Secondary | ICD-10-CM | POA: Insufficient documentation

## 2013-03-17 DIAGNOSIS — Z09 Encounter for follow-up examination after completed treatment for conditions other than malignant neoplasm: Secondary | ICD-10-CM | POA: Insufficient documentation

## 2013-03-17 DIAGNOSIS — I82409 Acute embolism and thrombosis of unspecified deep veins of unspecified lower extremity: Secondary | ICD-10-CM

## 2013-03-17 DIAGNOSIS — R229 Localized swelling, mass and lump, unspecified: Secondary | ICD-10-CM

## 2013-03-17 DIAGNOSIS — Z9010 Acquired absence of unspecified breast and nipple: Secondary | ICD-10-CM | POA: Insufficient documentation

## 2013-03-17 DIAGNOSIS — C50919 Malignant neoplasm of unspecified site of unspecified female breast: Secondary | ICD-10-CM

## 2013-03-17 DIAGNOSIS — M159 Polyosteoarthritis, unspecified: Secondary | ICD-10-CM | POA: Insufficient documentation

## 2013-03-17 DIAGNOSIS — C50419 Malignant neoplasm of upper-outer quadrant of unspecified female breast: Secondary | ICD-10-CM

## 2013-03-17 DIAGNOSIS — K219 Gastro-esophageal reflux disease without esophagitis: Secondary | ICD-10-CM | POA: Insufficient documentation

## 2013-03-17 LAB — CBC
HCT: 42.3 % (ref 36.0–46.0)
Hemoglobin: 14 g/dL (ref 12.0–15.0)
MCH: 28.2 pg (ref 26.0–34.0)
MCHC: 33.1 g/dL (ref 30.0–36.0)
MCV: 85.1 fL (ref 78.0–100.0)
PLATELETS: 291 10*3/uL (ref 150–400)
RBC: 4.97 MIL/uL (ref 3.87–5.11)
RDW: 13.8 % (ref 11.5–15.5)
WBC: 6.1 10*3/uL (ref 4.0–10.5)

## 2013-03-17 LAB — HEPATIC FUNCTION PANEL
ALBUMIN: 3.9 g/dL (ref 3.5–5.2)
ALT: 23 U/L (ref 0–35)
AST: 24 U/L (ref 0–37)
Alkaline Phosphatase: 88 U/L (ref 39–117)
Bilirubin, Direct: <0.2 mg/dL (ref 0.0–0.3)
TOTAL PROTEIN: 8.4 g/dL — AB (ref 6.0–8.3)
Total Bilirubin: 0.3 mg/dL (ref 0.3–1.2)

## 2013-03-17 LAB — CREATININE, SERUM
Creatinine, Ser: 0.69 mg/dL (ref 0.50–1.10)
GFR calc Af Amer: >90 mL/min (ref >90)

## 2013-03-17 LAB — D-DIMER, QUANTITATIVE (NOT AT ARMC): D DIMER QUANT: 0.46 ug{FEU}/mL (ref 0.00–0.48)

## 2013-03-17 NOTE — Patient Instructions (Signed)
[place] Discharge Instructions  RECOMMENDATIONS MADE BY THE CONSULTANT AND ANY TEST RESULTS WILL BE SENT TO YOUR REFERRING PHYSICIAN.  EXAM FINDINGS BY THE PHYSICIAN TODAY AND SIGNS OR SYMPTOMS TO REPORT TO CLINIC OR PRIMARY PHYSICIAN:   Labs today  Return in 6 months  The places on your fingers/foot are called [place]'s nodes. Take 2 Aleve every 12 hours to help decrease inflammation.    Thank you for choosing [facility] to provide your oncology and hematology care.  To afford each patient quality time with our providers, please arrive at least 15 minutes before your scheduled appointment time.  With your help, our goal is to use those 15 minutes to complete the necessary work-up to ensure our physicians have the information they need to help with your evaluation and healthcare recommendations.    Effective January 1st, 2014, we ask that you re-schedule your appointment with our physicians should you arrive 10 or more minutes late for your appointment.  We strive to give you quality time with our providers, and arriving late affects you and other patients whose appointments are after yours.    Again, thank you for choosing [facility].  Our hope is that these requests will decrease the amount of time that you wait before being seen by our physicians.       _____________________________________________________________  Should you have questions after your visit to [facility], please contact our office at [phone] between the hours of 8:30 a.m. and 5:00 p.m.  Voicemails left after 4:30 p.m. will not be returned until the following business day.  For prescription refill requests, have your pharmacy contact our office with your prescription refill request.

## 2013-03-17 NOTE — Progress Notes (Signed)
Wendy Gilbert presented for labwork. Labs per MD order drawn via Peripheral Line 23 gauge needle inserted in LT AC  Good blood return present. Procedure without incident.  Needle removed intact. Patient tolerated procedure well.

## 2013-03-17 NOTE — Progress Notes (Signed)
[facility]  OFFICE PROGRESS NOTE  [place], Elenore Rota, MD [place] [address]  DIAGNOSIS: Inflammatory carcinoma of breast - Plan: CEA, CEA  DVT (deep venous thrombosis) - Plan: D-dimer, quantitative, D-dimer, quantitative  Multiple skin nodules  Cancer of breast, unspecified laterality - Plan: CBC, Cancer antigen 27.29, Hepatic function panel, Creatinine, serum  Inflammatory carcinoma of breast, unspecified laterality - Plan: CBC, Cancer antigen 27.29, Hepatic function panel, Creatinine, serum  Chief Complaint  Patient presents with  . Breast Cancer  . Skin nodule  . DVT    CURRENT THERAPY: Watchful expectation  INTERVAL HISTORY: Wendy Gilbert 53 y.o. female returns for followup of inflammatory cancer of the right breast, status post bilateral mastectomy, chemotherapy, and radiation therapy to the right chest wall. Her original diagnosis was in June of 2010. Radiation therapy was completed in March of 2011. Her primary problem is joint discomfort particularly the fingers and the dorsum of the right foot. She does take 2 Advil at bedtime which allowed her to sleep. The pain in the distal interphalangeal joints of the upper extremities occurs during the day as does the right lower extremity discomfort in the mid dorsum of the foot. She denies any nausea, vomiting, diarrhea, constipation, dysuria, hematuria, lymphedema, hot flashes, vaginal bleeding or discharge, skin rash, headache, or seizures.   MEDICAL HISTORY: Past Medical History  Diagnosis Date  . DVT (deep venous thrombosis) 10/2008    left arm  . GERD (gastroesophageal reflux disease)   . Sinusitis   . PONV (postoperative nausea and vomiting)   . Headache(784.0)     sinus  . Arthritis     "all over"  . Inflammatory carcinoma of right breast 2010  . Seasonal allergies     INTERIM HISTORY: has Inflammatory carcinoma of right breast and DVT (deep venous  thrombosis) on her problem list.   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 The mass was 12 x 11.5 cm with diffuse peau d'orange of changes in the right breast and the right breast was also 2-1/2 times the size of the left breast. She received dose dense FVC for 5 cycles then switched to carboplatin and Taxotere 4 cycles. She then had bilateral mastectomies showing no evidence of disease in the breast and 14 lymph nodes were negative. She was treated with radiation therapy by Dr. Ledon Snare in March of 2011. She presented here for the first time on 08/06/2008.   ALLERGIES:  is allergic to demerol; erythromycin; adhesive; cephalexin; and penicillins.  MEDICATIONS: has a current medication list which includes the following prescription(s): ibuprofen, loratadine-pseudoephedrine, and omeprazole.  SURGICAL HISTORY:  Past Surgical History  Procedure Laterality Date  . Knee arthroscopy Left 11/1996  . Cesarean section  09/1999  . Dilation and curettage of uterus  1978  . Portacath placement  08/09/2008  . Incision and drainage abscess  10/25/2008    and debridement left axillary abscess, abd. wall abscess, left thigh abscess  . Mastectomy Bilateral 02/23/2009    right modified radical, left simple  . Port-a-cath removal Left 05/07/2012    Procedure: REMOVAL PORT-A-CATH;  Surgeon: Odis Hollingshead, MD;  Location: [facility];  Service: General;  Laterality: Left;    FAMILY HISTORY: family history includes Breast cancer in her mother; Cancer in  her mother and paternal uncle; Heart disease in her father.  SOCIAL HISTORY:  reports that she has never smoked. She has never used smokeless tobacco. She reports that she does not drink alcohol or use illicit drugs.  REVIEW OF SYSTEMS:  Other than that discussed above is  noncontributory.  PHYSICAL EXAMINATION: ECOG PERFORMANCE STATUS: 1 - Symptomatic but completely ambulatory  Blood pressure 132/88, pulse 119, temperature 97.7 F (36.5 C), temperature source Oral, resp. rate 20, weight 197 lb 9.6 oz (89.631 kg).  GENERAL:alert, no distress and comfortable SKIN: skin color, texture, turgor are normal, no rashes or significant lesions EYES: PERLA; Conjunctiva are pink and non-injected, sclera clear OROPHARYNX:no exudate, no erythema on lips, buccal mucosa, or tongue. NECK: supple, thyroid normal size, non-tender, without nodularity. No masses CHEST: Status post bilateral mastectomy with flank EKG on the right chest wall. No subcutaneous nodules. LYMPH:  no palpable lymphadenopathy in the cervical, axillary or inguinal LUNGS: clear to auscultation and percussion with normal breathing effort HEART: regular rate & rhythm and no murmurs. ABDOMEN:abdomen soft, non-tender and normal bowel sounds MUSCULOSKELETAL:no cyanosis of digits and no clubbing. Range of motion normal. Distal interphalangeal joint nodes present. Tenderness over the mid dorsum of the right foot consistent with inflamed joint. NEURO: alert & oriented x 3 with fluent speech, no focal motor/sensory deficits   LABORATORY DATA: Office Visit on 03/17/2013  Component Date Value Range Status  . D-Dimer, Quant 03/17/2013 0.46  0.00 - 0.48 ug/mL-FEU Final   Comment:                                 AT THE INHOUSE ESTABLISHED CUTOFF                          VALUE OF 0.48 ug/mL FEU,                          THIS ASSAY HAS BEEN DOCUMENTED                          IN THE LITERATURE TO HAVE                          A SENSITIVITY AND NEGATIVE                          PREDICTIVE VALUE OF AT LEAST                          98 TO 99%.  THE TEST RESULT                          SHOULD BE CORRELATED WITH                          AN ASSESSMENT OF THE CLINICAL                          PROBABILITY OF DVT / VTE.    . WBC 03/17/2013 6.1  4.0 - 10.5 K/uL Final  . RBC 03/17/2013 4.97  3.87 - 5.11 MIL/uL Final  . Hemoglobin 03/17/2013 14.0  12.0 - 15.0 g/dL Final  .  HCT 03/17/2013 42.3  36.0 - 46.0 % Final  . MCV 03/17/2013 85.1  78.0 - 100.0 fL Final  . MCH 03/17/2013 28.2  26.0 - 34.0 pg Final  . MCHC 03/17/2013 33.1  30.0 - 36.0 g/dL Final  . RDW 03/17/2013 13.8  11.5 - 15.5 % Final  . Platelets 03/17/2013 291  150 - 400 K/uL Final    PATHOLOGY: No new pathology.  Urinalysis    Component Value Date/Time   COLORURINE YELLOW 10/18/2008 1228   APPEARANCEUR HAZY* 10/18/2008 1228   LABSPEC 1.012 02/28/2009 1527   PHURINE 7.5 02/28/2009 1527   GLUCOSEU NEGATIVE 02/28/2009 1527   HGBUR NEGATIVE 02/28/2009 [place] 02/28/2009 1527   KETONESUR NEGATIVE 02/28/2009 1527   PROTEINUR NEGATIVE 02/28/2009 1527   UROBILINOGEN 0.2 02/28/2009 1527   NITRITE NEGATIVE 02/28/2009 1527   LEUKOCYTESUR TRACE* 02/28/2009 1527    RADIOGRAPHIC STUDIES: No results found.  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 The mass was 12 x 11.5 cm with diffuse peau d'orange of changes in the right breast and the right breast was also 2-1/2 times the size of the left breast. She received dose dense FVC for 5 cycles then switched to carboplatin and Taxotere 4 cycles. She then had bilateral mastectomies showing no evidence of disease in the breast and 14 lymph nodes were negative. She was treated with radiation therapy by Dr. Ledon Snare in March of 2011. She presented here for the first time on 08/06/2008. No evidence of disease pending today's lab reports. #2. Osteoarthritis with Heberden's nodes. #3. History of DVT of the left upper extremity, status post anticoagulation removal of Port-A-Cath and PICC line, no evidence of  recurrence.    PLAN:  #1. Recommend to Aleve every 12 hours. #2. If lab tests are abnormal, additional interventions will be undertaken. #3. Followup in 6 months with lab tests. CBC, chem profile, CEA, and CA 2729.   All questions were answered. The patient knows to call the clinic with any problems, questions or concerns. We can certainly see the patient much sooner if necessary.   I spent 25 minutes counseling the patient face to face. The total time spent in the appointment was 30 minutes.    Doroteo Bradford, MD 03/17/2013 3:10 PM

## 2013-03-18 LAB — CANCER ANTIGEN 27.29: CA 27.29: 23 U/mL (ref 0–39)

## 2013-03-18 LAB — CEA

## 2013-07-14 ENCOUNTER — Other Ambulatory Visit: Payer: Self-pay | Admitting: Surgical

## 2013-07-28 ENCOUNTER — Encounter (HOSPITAL_COMMUNITY): Payer: Self-pay | Admitting: Pharmacy Technician

## 2013-07-30 NOTE — Patient Instructions (Signed)
Wendy Gilbert  07/30/2013   Your procedure is scheduled on:  08/10/2013  1230pm-300pm  Report to [facility].  Follow the Signs to [facility] at 1030       am  Call this number if you have problems the morning of surgery: [phone]   Remember:   Do not eat food or drink liquids after midnight.   Take these medicines the morning of surgery with A SIP OF WATER:    Do not wear jewelry, make-up or nail polish.  Do not wear lotions, powders, or perfumes.   Do not shave 48 hours prior to surgery.   Do not bring valuables to the hospital.  Contacts, dentures or bridgework may not be worn into surgery.  Leave suitcase in the car. After surgery it may be brought to your room.  For patients admitted to the hospital, checkout time is 11:00 AM the day of  discharge.     [facility] - Preparing for Surgery Before surgery, you can play an important role.  Because skin is not sterile, your skin needs to be as free of germs as possible.  You can reduce the number of germs on your skin by washing with CHG (chlorahexidine gluconate) soap before surgery.  CHG is an antiseptic cleaner which kills germs and bonds with the skin to continue killing germs even after washing. Please DO NOT use if you have an allergy to CHG or antibacterial soaps.  If your skin becomes reddened/irritated stop using the CHG and inform your nurse when you arrive at Short Stay. Do not shave (including legs and underarms) for at least 48 hours prior to the first CHG shower.  You may shave your face/neck. Please follow these instructions carefully:  1.  Shower with CHG Soap the night before surgery and the  morning of Surgery.  2.  If you choose to wash your hair, wash your hair first as usual with your  normal  shampoo.  3.  After you shampoo, rinse your hair and body thoroughly to remove the  shampoo.                           4.  Use CHG as you would any other liquid soap.  You can apply chg directly  to the skin  and wash                       Gently with a scrungie or clean washcloth.  5.  Apply the CHG Soap to your body ONLY FROM THE NECK DOWN.   Do not use on face/ open                           Wound or open sores. Avoid contact with eyes, ears mouth and genitals (private parts).                       Wash face,  Genitals (private parts) with your normal soap.             6.  Wash thoroughly, paying special attention to the area where your surgery  will be performed.  7.  Thoroughly rinse your body with warm water from the neck down.  8.  DO NOT shower/wash with your normal soap after using and rinsing off  the CHG Soap.  9.  Pat yourself dry with a clean towel.            10.  Wear clean pajamas.            11.  Place clean sheets on your bed the night of your first shower and do not  sleep with pets. Day of Surgery : Do not apply any lotions/deodorants the morning of surgery.  Please wear clean clothes to the hospital/surgery center.  FAILURE TO FOLLOW THESE INSTRUCTIONS MAY RESULT IN THE CANCELLATION OF YOUR SURGERY PATIENT SIGNATURE_________________________________  NURSE SIGNATURE__________________________________  ________________________________________________________________________   Adam Phenix  An incentive spirometer is a tool that can help keep your lungs clear and active. This tool measures how well you are filling your lungs with each breath. Taking long deep breaths may help reverse or decrease the chance of developing breathing (pulmonary) problems (especially infection) following:  A long period of time when you are unable to move or be active. BEFORE THE PROCEDURE   If the spirometer includes an indicator to show your best effort, your nurse or respiratory therapist will set it to a desired goal.  If possible, sit up straight or lean slightly forward. Try not to slouch.  Hold the incentive spirometer in an upright position. INSTRUCTIONS FOR USE   1. Sit on the edge of your bed if possible, or sit up as far as you can in bed or on a chair. 2. Hold the incentive spirometer in an upright position. 3. Breathe out normally. 4. Place the mouthpiece in your mouth and seal your lips tightly around it. 5. Breathe in slowly and as deeply as possible, raising the piston or the ball toward the top of the column. 6. Hold your breath for 3-5 seconds or for as long as possible. Allow the piston or ball to fall to the bottom of the column. 7. Remove the mouthpiece from your mouth and breathe out normally. 8. Rest for a few seconds and repeat Steps 1 through 7 at least 10 times every 1-2 hours when you are awake. Take your time and take a few normal breaths between deep breaths. 9. The spirometer may include an indicator to show your best effort. Use the indicator as a goal to work toward during each repetition. 10. After each set of 10 deep breaths, practice coughing to be sure your lungs are clear. If you have an incision (the cut made at the time of surgery), support your incision when coughing by placing a pillow or rolled up towels firmly against it. Once you are able to get out of bed, walk around indoors and cough well. You may stop using the incentive spirometer when instructed by your caregiver.  RISKS AND COMPLICATIONS  Take your time so you do not get dizzy or light-headed.  If you are in pain, you may need to take or ask for pain medication before doing incentive spirometry. It is harder to take a deep breath if you are having pain. AFTER USE  Rest and breathe slowly and easily.  It can be helpful to keep track of a log of your progress. Your caregiver can provide you with a simple table to help with this. If you are using the spirometer at home, follow these instructions: [place] IF:   You are having difficultly using the spirometer.  You have trouble using the spirometer as often as instructed.  Your pain medication is  not giving enough relief while using the spirometer.  You  develop fever of 100.5 F (38.1 C) or higher. SEEK IMMEDIATE MEDICAL CARE IF:   You cough up bloody sputum that had not been present before.  You develop fever of 102 F (38.9 C) or greater.  You develop worsening pain at or near the incision site. MAKE SURE YOU:   Understand these instructions.  Will watch your condition.  Will get help right away if you are not doing well or get worse. Document Released: 06/10/2006 Document Revised: 04/22/2011 Document Reviewed: 08/11/2006 ExitCare Patient Information 2014 ExitCare, [place].   ________________________________________________________________________  WHAT IS A BLOOD TRANSFUSION? Blood Transfusion Information  A transfusion is the replacement of blood or some of its parts. Blood is made up of multiple cells which provide different functions.  Red blood cells carry oxygen and are used for blood loss replacement.  White blood cells fight against infection.  Platelets control bleeding.  Plasma helps clot blood.  Other blood products are available for specialized needs, such as hemophilia or other clotting disorders. BEFORE THE TRANSFUSION  Who gives blood for transfusions?   Healthy volunteers who are fully evaluated to make sure their blood is safe. This is blood bank blood. Transfusion therapy is the safest it has ever been in the practice of medicine. Before blood is taken from a donor, a complete history is taken to make sure that person has no history of diseases nor engages in risky social behavior (examples are intravenous drug use or sexual activity with multiple partners). The donor's travel history is screened to minimize risk of transmitting infections, such as malaria. The donated blood is tested for signs of infectious diseases, such as HIV and hepatitis. The blood is then tested to be sure it is compatible with you in order to minimize the chance of a  transfusion reaction. If you or a relative donates blood, this is often done in anticipation of surgery and is not appropriate for emergency situations. It takes many days to process the donated blood. RISKS AND COMPLICATIONS Although transfusion therapy is very safe and saves many lives, the main dangers of transfusion include:   Getting an infectious disease.  Developing a transfusion reaction. This is an allergic reaction to something in the blood you were given. Every precaution is taken to prevent this. The decision to have a blood transfusion has been considered carefully by your caregiver before blood is given. Blood is not given unless the benefits outweigh the risks. AFTER THE TRANSFUSION  Right after receiving a blood transfusion, you will usually feel much better and more energetic. This is especially true if your red blood cells have gotten low (anemic). The transfusion raises the level of the red blood cells which carry oxygen, and this usually causes an energy increase.  The nurse administering the transfusion will monitor you carefully for complications. HOME CARE INSTRUCTIONS  No special instructions are needed after a transfusion. You may find your energy is better. Speak with your caregiver about any limitations on activity for underlying diseases you may have. SEEK MEDICAL CARE IF:   Your condition is not improving after your transfusion.  You develop redness or irritation at the intravenous (IV) site. SEEK IMMEDIATE MEDICAL CARE IF:  Any of the following symptoms occur over the next 12 hours:  Shaking chills.  You have a temperature by mouth above 102 F (38.9 C), not controlled by medicine.  Chest, back, or muscle pain.  People around you feel you are not acting correctly or are confused.  Shortness of breath  or difficulty breathing.  Dizziness and fainting.  You get a rash or develop hives.  You have a decrease in urine output.  Your urine turns a dark  color or changes to pink, red, or brown. Any of the following symptoms occur over the next 10 days:  You have a temperature by mouth above 102 F (38.9 C), not controlled by medicine.  Shortness of breath.  Weakness after normal activity.  The white part of the eye turns yellow (jaundice).  You have a decrease in the amount of urine or are urinating less often.  Your urine turns a dark color or changes to pink, red, or brown. Document Released: 01/26/2000 Document Revised: 04/22/2011 Document Reviewed: 09/14/2007 ExitCare Patient Information 2014 ExitCare, [place].  _______________________________________________________________________   Please read over the following fact sheets that you were given: MRSA Information, coughing and deep breathing exercises, leg exercises

## 2013-08-02 ENCOUNTER — Ambulatory Visit (HOSPITAL_COMMUNITY)
Admission: RE | Admit: 2013-08-02 | Discharge: 2013-08-02 | Disposition: A | Discharge status: Home / Self Care | Payer: Medicare Other | Source: Ambulatory Visit | Attending: Orthopedic Surgery | Admitting: Orthopedic Surgery

## 2013-08-02 ENCOUNTER — Encounter (HOSPITAL_COMMUNITY): Payer: Self-pay

## 2013-08-02 ENCOUNTER — Encounter (HOSPITAL_COMMUNITY)
Admission: RE | Admit: 2013-08-02 | Discharge: 2013-08-02 | Disposition: A | Discharge status: Home / Self Care | Payer: Medicare Other | Source: Ambulatory Visit | Attending: Orthopedic Surgery | Admitting: Orthopedic Surgery

## 2013-08-02 DIAGNOSIS — Z01812 Encounter for preprocedural laboratory examination: Secondary | ICD-10-CM | POA: Insufficient documentation

## 2013-08-02 DIAGNOSIS — Z853 Personal history of malignant neoplasm of breast: Secondary | ICD-10-CM | POA: Insufficient documentation

## 2013-08-02 DIAGNOSIS — Z86718 Personal history of other venous thrombosis and embolism: Secondary | ICD-10-CM | POA: Diagnosis not present

## 2013-08-02 DIAGNOSIS — Z01818 Encounter for other preprocedural examination: Secondary | ICD-10-CM | POA: Insufficient documentation

## 2013-08-02 DIAGNOSIS — Z01810 Encounter for preprocedural cardiovascular examination: Secondary | ICD-10-CM | POA: Insufficient documentation

## 2013-08-02 LAB — CBC
HCT: 40.4 % (ref 36.0–46.0)
Hemoglobin: 13.2 g/dL (ref 12.0–15.0)
MCH: 28 pg (ref 26.0–34.0)
MCHC: 32.7 g/dL (ref 30.0–36.0)
MCV: 85.6 fL (ref 78.0–100.0)
PLATELETS: 271 10*3/uL (ref 150–400)
RBC: 4.72 MIL/uL (ref 3.87–5.11)
RDW: 13.5 % (ref 11.5–15.5)
WBC: 5.1 10*3/uL (ref 4.0–10.5)

## 2013-08-02 LAB — URINALYSIS, ROUTINE W REFLEX MICROSCOPIC
Bilirubin Urine: NEGATIVE
Glucose, UA: NEGATIVE mg/dL
Hgb urine dipstick: NEGATIVE
Ketones, ur: NEGATIVE mg/dL
Leukocytes, UA: NEGATIVE
Nitrite: NEGATIVE
Protein, ur: NEGATIVE mg/dL
Specific Gravity, Urine: 1.026 (ref 1.005–1.030)
Urobilinogen, UA: 0.2 mg/dL (ref 0.0–1.0)
pH: 5.5 (ref 5.0–8.0)

## 2013-08-02 LAB — COMPREHENSIVE METABOLIC PANEL
ALT: 25 U/L (ref 0–35)
AST: 25 U/L (ref 0–37)
Albumin: 3.8 g/dL (ref 3.5–5.2)
Alkaline Phosphatase: 83 U/L (ref 39–117)
BUN: 16 mg/dL (ref 6–23)
CO2: 27 mEq/L (ref 19–32)
Calcium: 9.4 mg/dL (ref 8.4–10.5)
Chloride: 103 mEq/L (ref 96–112)
Creatinine, Ser: 0.68 mg/dL (ref 0.50–1.10)
GFR calc Af Amer: >90 mL/min (ref >90)
GFR calc non Af Amer: >90 mL/min (ref >90)
Glucose, Bld: 87 mg/dL (ref 70–99)
Potassium: 4 mEq/L (ref 3.7–5.3)
Sodium: 141 mEq/L (ref 137–147)
Total Bilirubin: 0.3 mg/dL (ref 0.3–1.2)
Total Protein: 7.8 g/dL (ref 6.0–8.3)

## 2013-08-02 LAB — SURGICAL PCR SCREEN
MRSA, PCR: NEGATIVE
Staphylococcus aureus: NEGATIVE

## 2013-08-02 LAB — APTT: aPTT: 29 seconds (ref 24–37)

## 2013-08-02 LAB — ABO/RH: ABO/RH(D): O POS

## 2013-08-02 LAB — PROTIME-INR
INR: 1.08 (ref 0.00–1.49)
Prothrombin Time: 13.8 seconds (ref 11.6–15.2)

## 2013-08-02 NOTE — Progress Notes (Signed)
Clearance note on chart dated 06/15/2013 from Dr [name].

## 2013-08-10 ENCOUNTER — Inpatient Hospital Stay (HOSPITAL_COMMUNITY): Payer: Medicare Other | Admitting: Anesthesiology

## 2013-08-10 ENCOUNTER — Encounter (HOSPITAL_COMMUNITY): Payer: Medicare Other | Admitting: Anesthesiology

## 2013-08-10 ENCOUNTER — Inpatient Hospital Stay (HOSPITAL_COMMUNITY): Payer: Medicare Other

## 2013-08-10 ENCOUNTER — Encounter (HOSPITAL_COMMUNITY): Payer: Self-pay | Admitting: *Deleted

## 2013-08-10 ENCOUNTER — Inpatient Hospital Stay (HOSPITAL_COMMUNITY)
Admission: RE | Admit: 2013-08-10 | Discharge: 2013-08-14 | DRG: 470 | Disposition: A | Discharge status: Home Health | Payer: Medicare Other | Source: Ambulatory Visit | Attending: Orthopedic Surgery | Admitting: Orthopedic Surgery

## 2013-08-10 ENCOUNTER — Encounter (HOSPITAL_COMMUNITY)
Admission: RE | Disposition: A | Discharge status: Home Health | Payer: Self-pay | Source: Ambulatory Visit | Attending: Orthopedic Surgery

## 2013-08-10 DIAGNOSIS — K219 Gastro-esophageal reflux disease without esophagitis: Secondary | ICD-10-CM | POA: Diagnosis present

## 2013-08-10 DIAGNOSIS — Z8249 Family history of ischemic heart disease and other diseases of the circulatory system: Secondary | ICD-10-CM

## 2013-08-10 DIAGNOSIS — Z79899 Other long term (current) drug therapy: Secondary | ICD-10-CM

## 2013-08-10 DIAGNOSIS — R000 Tachycardia, unspecified: Secondary | ICD-10-CM

## 2013-08-10 DIAGNOSIS — Z885 Allergy status to narcotic agent status: Secondary | ICD-10-CM

## 2013-08-10 DIAGNOSIS — Z96652 Presence of left artificial knee joint: Secondary | ICD-10-CM

## 2013-08-10 DIAGNOSIS — Z791 Long term (current) use of non-steroidal anti-inflammatories (NSAID): Secondary | ICD-10-CM

## 2013-08-10 DIAGNOSIS — Z853 Personal history of malignant neoplasm of breast: Secondary | ICD-10-CM

## 2013-08-10 DIAGNOSIS — Z880 Allergy status to penicillin: Secondary | ICD-10-CM

## 2013-08-10 DIAGNOSIS — Z6834 Body mass index (BMI) 34.0-34.9, adult: Secondary | ICD-10-CM

## 2013-08-10 DIAGNOSIS — Z9010 Acquired absence of unspecified breast and nipple: Secondary | ICD-10-CM

## 2013-08-10 DIAGNOSIS — R509 Fever, unspecified: Secondary | ICD-10-CM | POA: Diagnosis not present

## 2013-08-10 DIAGNOSIS — Z86718 Personal history of other venous thrombosis and embolism: Secondary | ICD-10-CM

## 2013-08-10 DIAGNOSIS — M1710 Unilateral primary osteoarthritis, unspecified knee: Principal | ICD-10-CM | POA: Diagnosis present

## 2013-08-10 DIAGNOSIS — Z803 Family history of malignant neoplasm of breast: Secondary | ICD-10-CM

## 2013-08-10 DIAGNOSIS — Z9109 Other allergy status, other than to drugs and biological substances: Secondary | ICD-10-CM

## 2013-08-10 DIAGNOSIS — Z96659 Presence of unspecified artificial knee joint: Secondary | ICD-10-CM

## 2013-08-10 DIAGNOSIS — I498 Other specified cardiac arrhythmias: Secondary | ICD-10-CM | POA: Diagnosis not present

## 2013-08-10 DIAGNOSIS — Z881 Allergy status to other antibiotic agents status: Secondary | ICD-10-CM

## 2013-08-10 DIAGNOSIS — E860 Dehydration: Secondary | ICD-10-CM | POA: Diagnosis not present

## 2013-08-10 DIAGNOSIS — M1712 Unilateral primary osteoarthritis, left knee: Secondary | ICD-10-CM | POA: Diagnosis present

## 2013-08-10 HISTORY — PX: TOTAL KNEE ARTHROPLASTY: SHX125

## 2013-08-10 LAB — TYPE AND SCREEN
ABO/RH(D): O POS
Antibody Screen: NEGATIVE

## 2013-08-10 SURGERY — ARTHROPLASTY, KNEE, TOTAL
Anesthesia: General | Site: Knee | Laterality: Left

## 2013-08-10 MED ORDER — PROMETHAZINE HCL 25 MG/ML IJ SOLN
6.2500 mg | Freq: Four times a day (QID) | INTRAMUSCULAR | Status: DC | PRN
  Administered 2013-08-10 – 2013-08-13 (×5): 6.25 mg via INTRAVENOUS
  Filled 2013-08-10 (×5): qty 1

## 2013-08-10 MED ORDER — LABETALOL HCL 5 MG/ML IV SOLN
INTRAVENOUS | Status: AC
  Filled 2013-08-10: qty 4

## 2013-08-10 MED ORDER — BUPIVACAINE LIPOSOME 1.3 % IJ SUSP
20.0000 mL | Freq: Once | INTRAMUSCULAR | Status: AC
  Administered 2013-08-10: 10 mL
  Filled 2013-08-10: qty 20

## 2013-08-10 MED ORDER — PANTOPRAZOLE SODIUM 40 MG PO TBEC
40.0000 mg | DELAYED_RELEASE_TABLET | Freq: Every day | ORAL | Status: DC
  Administered 2013-08-11 – 2013-08-14 (×4): 40 mg via ORAL
  Filled 2013-08-10 (×6): qty 1

## 2013-08-10 MED ORDER — HYDROMORPHONE HCL PF 1 MG/ML IJ SOLN
INTRAMUSCULAR | Status: AC
  Administered 2013-08-11: 1 mg via INTRAVENOUS
  Filled 2013-08-10: qty 1

## 2013-08-10 MED ORDER — ONDANSETRON HCL 4 MG PO TABS
4.0000 mg | ORAL_TABLET | Freq: Four times a day (QID) | ORAL | Status: DC | PRN
  Administered 2013-08-10 – 2013-08-13 (×3): 4 mg via ORAL
  Filled 2013-08-10 (×3): qty 1

## 2013-08-10 MED ORDER — ONDANSETRON HCL 4 MG/2ML IJ SOLN
4.0000 mg | Freq: Four times a day (QID) | INTRAMUSCULAR | Status: DC | PRN
  Administered 2013-08-11 (×2): 4 mg via INTRAVENOUS
  Filled 2013-08-10 (×2): qty 2

## 2013-08-10 MED ORDER — CHLORHEXIDINE GLUCONATE 4 % EX LIQD: 60.0000 mL | Freq: Once | CUTANEOUS | Status: DC

## 2013-08-10 MED ORDER — DEXAMETHASONE SODIUM PHOSPHATE 10 MG/ML IJ SOLN
INTRAMUSCULAR | Status: AC
  Filled 2013-08-10: qty 1

## 2013-08-10 MED ORDER — HYDROMORPHONE HCL PF 1 MG/ML IJ SOLN
0.2500 mg | INTRAMUSCULAR | Status: DC | PRN
  Administered 2013-08-10 (×2): 0.5 mg via INTRAVENOUS

## 2013-08-10 MED ORDER — LACTATED RINGERS IV SOLN
INTRAVENOUS | Status: DC
  Administered 2013-08-10: 18:00:00 via INTRAVENOUS

## 2013-08-10 MED ORDER — METHOCARBAMOL 1000 MG/10ML IJ SOLN
500.0000 mg | Freq: Four times a day (QID) | INTRAMUSCULAR | Status: DC | PRN
  Administered 2013-08-11: 500 mg via INTRAVENOUS
  Filled 2013-08-10: qty 5

## 2013-08-10 MED ORDER — METHOCARBAMOL 500 MG PO TABS
500.0000 mg | ORAL_TABLET | Freq: Four times a day (QID) | ORAL | Status: DC | PRN
  Administered 2013-08-10 – 2013-08-14 (×9): 500 mg via ORAL
  Filled 2013-08-10 (×10): qty 1

## 2013-08-10 MED ORDER — HYDROMORPHONE HCL 2 MG PO TABS
2.0000 mg | ORAL_TABLET | ORAL | Status: DC | PRN
  Administered 2013-08-10 – 2013-08-11 (×4): 2 mg via ORAL
  Filled 2013-08-10 (×5): qty 1

## 2013-08-10 MED ORDER — GLYCOPYRROLATE 0.2 MG/ML IJ SOLN
INTRAMUSCULAR | Status: DC | PRN
  Administered 2013-08-10: 0.4 mg via INTRAVENOUS

## 2013-08-10 MED ORDER — LACTATED RINGERS IV SOLN: INTRAVENOUS | Status: DC

## 2013-08-10 MED ORDER — THROMBIN 5000 UNITS EX SOLR
CUTANEOUS | Status: DC | PRN
Start: 2013-08-10 — End: 2013-08-10
  Administered 2013-08-10: 5000 [IU] via TOPICAL

## 2013-08-10 MED ORDER — DEXAMETHASONE SODIUM PHOSPHATE 10 MG/ML IJ SOLN
INTRAMUSCULAR | Status: DC | PRN
  Administered 2013-08-10: 10 mg via INTRAVENOUS

## 2013-08-10 MED ORDER — PROPOFOL 10 MG/ML IV BOLUS
INTRAVENOUS | Status: AC
  Filled 2013-08-10: qty 20

## 2013-08-10 MED ORDER — BISACODYL 5 MG PO TBEC: 5.0000 mg | DELAYED_RELEASE_TABLET | Freq: Every day | ORAL | Status: DC | PRN

## 2013-08-10 MED ORDER — HYDROMORPHONE HCL PF 1 MG/ML IJ SOLN
INTRAMUSCULAR | Status: DC | PRN
  Administered 2013-08-10: 1 mg via INTRAVENOUS

## 2013-08-10 MED ORDER — MIDAZOLAM HCL 5 MG/5ML IJ SOLN
INTRAMUSCULAR | Status: DC | PRN
  Administered 2013-08-10: 2 mg via INTRAVENOUS

## 2013-08-10 MED ORDER — SODIUM CHLORIDE 0.9 % IJ SOLN
INTRAMUSCULAR | Status: DC | PRN
  Administered 2013-08-10: 20 mL via INTRAVENOUS

## 2013-08-10 MED ORDER — MENTHOL 3 MG MT LOZG: 1.0000 | LOZENGE | OROMUCOSAL | Status: DC | PRN

## 2013-08-10 MED ORDER — FLEET ENEMA 7-19 GM/118ML RE ENEM: 1.0000 | ENEMA | Freq: Once | RECTAL | Status: AC | PRN

## 2013-08-10 MED ORDER — THROMBIN 5000 UNITS EX SOLR
CUTANEOUS | Status: AC
  Filled 2013-08-10: qty 5000

## 2013-08-10 MED ORDER — SODIUM CHLORIDE 0.9 % IR SOLN
Status: DC | PRN
  Administered 2013-08-10: 12:00:00

## 2013-08-10 MED ORDER — CLINDAMYCIN PHOSPHATE 900 MG/50ML IV SOLN
900.0000 mg | INTRAVENOUS | Status: AC
  Administered 2013-08-10: 900 mg via INTRAVENOUS

## 2013-08-10 MED ORDER — FENTANYL CITRATE 0.05 MG/ML IJ SOLN
INTRAMUSCULAR | Status: AC
Start: 2013-08-10 — End: 2013-08-10
  Filled 2013-08-10: qty 5

## 2013-08-10 MED ORDER — FENTANYL CITRATE 0.05 MG/ML IJ SOLN
INTRAMUSCULAR | Status: AC
  Filled 2013-08-10: qty 5

## 2013-08-10 MED ORDER — LIP MEDEX EX OINT
TOPICAL_OINTMENT | CUTANEOUS | Status: AC
  Filled 2013-08-10: qty 7

## 2013-08-10 MED ORDER — LABETALOL HCL 5 MG/ML IV SOLN
INTRAVENOUS | Status: DC | PRN
  Administered 2013-08-10: 5 mg via INTRAVENOUS

## 2013-08-10 MED ORDER — SODIUM CHLORIDE 0.9 % IR SOLN
Status: DC | PRN
  Administered 2013-08-10: 1000 mL

## 2013-08-10 MED ORDER — ACETAMINOPHEN 325 MG PO TABS
650.0000 mg | ORAL_TABLET | Freq: Four times a day (QID) | ORAL | Status: DC | PRN
  Administered 2013-08-11 – 2013-08-13 (×2): 650 mg via ORAL
  Filled 2013-08-10 (×2): qty 2

## 2013-08-10 MED ORDER — HYDROMORPHONE HCL PF 2 MG/ML IJ SOLN
INTRAMUSCULAR | Status: AC
  Filled 2013-08-10: qty 1

## 2013-08-10 MED ORDER — CLINDAMYCIN PHOSPHATE 600 MG/50ML IV SOLN
600.0000 mg | Freq: Four times a day (QID) | INTRAVENOUS | Status: AC
  Administered 2013-08-10 – 2013-08-11 (×2): 600 mg via INTRAVENOUS
  Filled 2013-08-10 (×3): qty 50

## 2013-08-10 MED ORDER — POLYETHYLENE GLYCOL 3350 17 G PO PACK
17.0000 g | PACK | Freq: Every day | ORAL | Status: DC | PRN
  Administered 2013-08-12: 17 g via ORAL
  Filled 2013-08-10: qty 1

## 2013-08-10 MED ORDER — PHENOL 1.4 % MT LIQD: 1.0000 | OROMUCOSAL | Status: DC | PRN

## 2013-08-10 MED ORDER — MIDAZOLAM HCL 2 MG/2ML IJ SOLN
INTRAMUSCULAR | Status: AC
  Filled 2013-08-10: qty 2

## 2013-08-10 MED ORDER — NEOSTIGMINE METHYLSULFATE 10 MG/10ML IV SOLN
INTRAVENOUS | Status: DC | PRN
  Administered 2013-08-10: 3 mg via INTRAVENOUS

## 2013-08-10 MED ORDER — LIDOCAINE HCL (CARDIAC) 20 MG/ML IV SOLN
INTRAVENOUS | Status: AC
  Filled 2013-08-10: qty 5

## 2013-08-10 MED ORDER — METOPROLOL TARTRATE 1 MG/ML IV SOLN
INTRAVENOUS | Status: DC | PRN
  Administered 2013-08-10: 1 mg via INTRAVENOUS
  Administered 2013-08-10 (×2): 2 mg via INTRAVENOUS

## 2013-08-10 MED ORDER — ACETAMINOPHEN 650 MG RE SUPP: 650.0000 mg | Freq: Four times a day (QID) | RECTAL | Status: DC | PRN

## 2013-08-10 MED ORDER — HYDROMORPHONE HCL PF 1 MG/ML IJ SOLN
1.0000 mg | INTRAMUSCULAR | Status: DC | PRN
  Administered 2013-08-10 – 2013-08-11 (×5): 1 mg via INTRAVENOUS
  Filled 2013-08-10 (×6): qty 1

## 2013-08-10 MED ORDER — ALUM & MAG HYDROXIDE-SIMETH 200-200-20 MG/5ML PO SUSP
30.0000 mL | ORAL | Status: DC | PRN
  Administered 2013-08-12: 30 mL via ORAL
  Filled 2013-08-10: qty 30

## 2013-08-10 MED ORDER — FERROUS SULFATE 325 (65 FE) MG PO TABS
325.0000 mg | ORAL_TABLET | Freq: Three times a day (TID) | ORAL | Status: DC
  Administered 2013-08-12 – 2013-08-14 (×3): 325 mg via ORAL
  Filled 2013-08-10 (×15): qty 1

## 2013-08-10 MED ORDER — ONDANSETRON HCL 4 MG/2ML IJ SOLN
INTRAMUSCULAR | Status: AC
  Filled 2013-08-10: qty 2

## 2013-08-10 MED ORDER — PROMETHAZINE HCL 25 MG/ML IJ SOLN: 6.2500 mg | INTRAMUSCULAR | Status: DC | PRN

## 2013-08-10 MED ORDER — ROCURONIUM BROMIDE 100 MG/10ML IV SOLN
INTRAVENOUS | Status: DC | PRN
  Administered 2013-08-10: 50 mg via INTRAVENOUS

## 2013-08-10 MED ORDER — PROPOFOL 10 MG/ML IV BOLUS
INTRAVENOUS | Status: DC | PRN
  Administered 2013-08-10: 180 mg via INTRAVENOUS

## 2013-08-10 MED ORDER — FENTANYL CITRATE 0.05 MG/ML IJ SOLN
INTRAMUSCULAR | Status: DC | PRN
  Administered 2013-08-10 (×2): 50 ug via INTRAVENOUS
  Administered 2013-08-10: 100 ug via INTRAVENOUS
  Administered 2013-08-10 (×3): 50 ug via INTRAVENOUS
  Administered 2013-08-10: 100 ug via INTRAVENOUS

## 2013-08-10 MED ORDER — METOCLOPRAMIDE HCL 5 MG/ML IJ SOLN
INTRAMUSCULAR | Status: DC | PRN
  Administered 2013-08-10: 10 mg via INTRAVENOUS

## 2013-08-10 MED ORDER — SODIUM CHLORIDE 0.9 % IJ SOLN
INTRAMUSCULAR | Status: AC
  Filled 2013-08-10: qty 20

## 2013-08-10 MED ORDER — LACTATED RINGERS IV SOLN
INTRAVENOUS | Status: DC
  Administered 2013-08-10: 1000 mL via INTRAVENOUS
  Administered 2013-08-10 (×2): via INTRAVENOUS

## 2013-08-10 MED ORDER — ONDANSETRON HCL 4 MG/2ML IJ SOLN
INTRAMUSCULAR | Status: DC | PRN
  Administered 2013-08-10: 4 mg via INTRAVENOUS

## 2013-08-10 MED ORDER — METOCLOPRAMIDE HCL 5 MG/ML IJ SOLN
INTRAMUSCULAR | Status: AC
  Filled 2013-08-10: qty 2

## 2013-08-10 MED ORDER — CLINDAMYCIN PHOSPHATE 900 MG/50ML IV SOLN
INTRAVENOUS | Status: AC
  Filled 2013-08-10: qty 50

## 2013-08-10 MED ORDER — LIDOCAINE HCL (CARDIAC) 20 MG/ML IV SOLN
INTRAVENOUS | Status: DC | PRN
  Administered 2013-08-10: 80 mg via INTRAVENOUS

## 2013-08-10 MED ORDER — ACETAMINOPHEN 10 MG/ML IV SOLN
1000.0000 mg | Freq: Once | INTRAVENOUS | Status: AC
  Administered 2013-08-10: 1000 mg via INTRAVENOUS
  Filled 2013-08-10: qty 100

## 2013-08-10 SURGICAL SUPPLY — 67 items
ADH SKN CLS APL DERMABOND .7 (GAUZE/BANDAGES/DRESSINGS) ×1
BAG SPEC THK2 15X12 ZIP CLS (MISCELLANEOUS) ×1
BAG ZIPLOCK 12X15 (MISCELLANEOUS) ×2 IMPLANT
BANDAGE ELASTIC 4 VELCRO ST LF (GAUZE/BANDAGES/DRESSINGS) ×2 IMPLANT
BANDAGE ELASTIC 6 VELCRO ST LF (GAUZE/BANDAGES/DRESSINGS) ×2 IMPLANT
BANDAGE ESMARK 6X9 LF (GAUZE/BANDAGES/DRESSINGS) ×1 IMPLANT
BLADE SAG 18X100X1.27 (BLADE) ×2 IMPLANT
BLADE SAW SGTL 11.0X1.19X90.0M (BLADE) ×2 IMPLANT
BNDG CMPR 9X6 STRL LF SNTH (GAUZE/BANDAGES/DRESSINGS) ×1
BNDG ESMARK 6X9 LF (GAUZE/BANDAGES/DRESSINGS) ×2
BONE CEMENT GENTAMICIN (Cement) ×4 IMPLANT
CAPT RP KNEE ×1 IMPLANT
CEMENT BONE GENTAMICIN 40 (Cement) ×2 IMPLANT
CUFF TOURN SGL QUICK 34 (TOURNIQUET CUFF) ×2
CUFF TRNQT CYL 34X4X40X1 (TOURNIQUET CUFF) ×1 IMPLANT
DERMABOND ADVANCED (GAUZE/BANDAGES/DRESSINGS) ×1
DERMABOND ADVANCED .7 DNX12 (GAUZE/BANDAGES/DRESSINGS) IMPLANT
DRAPE EXTREMITY T 121X128X90 (DRAPE) ×2 IMPLANT
DRAPE INCISE IOBAN 66X45 STRL (DRAPES) ×2 IMPLANT
DRAPE LG THREE QUARTER DISP (DRAPES) ×2 IMPLANT
DRAPE POUCH INSTRU U-SHP 10X18 (DRAPES) ×2 IMPLANT
DRAPE U-SHAPE 47X51 STRL (DRAPES) ×2 IMPLANT
DRSG AQUACEL AG ADV 3.5X10 (GAUZE/BANDAGES/DRESSINGS) ×1 IMPLANT
DRSG PAD ABDOMINAL 8X10 ST (GAUZE/BANDAGES/DRESSINGS) IMPLANT
DRSG TEGADERM 4X4.75 (GAUZE/BANDAGES/DRESSINGS) ×1 IMPLANT
DURAPREP 26ML APPLICATOR (WOUND CARE) ×2 IMPLANT
ELECT REM PT RETURN 9FT ADLT (ELECTROSURGICAL) ×2
ELECTRODE REM PT RTRN 9FT ADLT (ELECTROSURGICAL) ×1 IMPLANT
EVACUATOR 1/8 PVC DRAIN (DRAIN) ×2 IMPLANT
FACESHIELD WRAPAROUND (MASK) ×10 IMPLANT
FACESHIELD WRAPAROUND OR TEAM (MASK) ×5 IMPLANT
GAUZE SPONGE 2X2 8PLY STRL LF (GAUZE/BANDAGES/DRESSINGS) IMPLANT
GLOVE BIOGEL PI IND STRL 8 (GLOVE) ×1 IMPLANT
GLOVE BIOGEL PI INDICATOR 8 (GLOVE) ×2
GLOVE ECLIPSE 8.0 STRL XLNG CF (GLOVE) ×5 IMPLANT
GLOVE SURG SS PI 6.5 STRL IVOR (GLOVE) ×4 IMPLANT
GOWN STRL REUS W/TWL LRG LVL3 (GOWN DISPOSABLE) ×2 IMPLANT
GOWN STRL REUS W/TWL XL LVL3 (GOWN DISPOSABLE) ×2 IMPLANT
HANDPIECE INTERPULSE COAX TIP (DISPOSABLE) ×2
IMMOBILIZER KNEE 20 (SOFTGOODS) ×3 IMPLANT
IMMOBILIZER KNEE 20 THIGH 36 (SOFTGOODS) ×1 IMPLANT
KIT BASIN OR (CUSTOM PROCEDURE TRAY) ×2 IMPLANT
MANIFOLD NEPTUNE II (INSTRUMENTS) ×2 IMPLANT
NEEDLE HYPO 22GX1.5 SAFETY (NEEDLE) ×2 IMPLANT
NS IRRIG 1000ML POUR BTL (IV SOLUTION) IMPLANT
PACK TOTAL JOINT (CUSTOM PROCEDURE TRAY) ×2 IMPLANT
PADDING CAST COTTON 6X4 STRL (CAST SUPPLIES) IMPLANT
POSITIONER SURGICAL ARM (MISCELLANEOUS) ×2 IMPLANT
SET HNDPC FAN SPRY TIP SCT (DISPOSABLE) ×1 IMPLANT
SPONGE GAUZE 2X2 STER 10/PKG (GAUZE/BANDAGES/DRESSINGS) ×1
SPONGE LAP 18X18 X RAY DECT (DISPOSABLE) IMPLANT
SPONGE SURGIFOAM ABS GEL 100 (HEMOSTASIS) ×2 IMPLANT
STAPLER VISISTAT 35W (STAPLE) IMPLANT
SUCTION FRAZIER 12FR DISP (SUCTIONS) ×2 IMPLANT
SUT BONE WAX W31G (SUTURE) ×1 IMPLANT
SUT MNCRL AB 4-0 PS2 18 (SUTURE) ×2 IMPLANT
SUT VIC AB 1 CT1 27 (SUTURE) ×4
SUT VIC AB 1 CT1 27XBRD ANTBC (SUTURE) ×2 IMPLANT
SUT VIC AB 2-0 CT1 27 (SUTURE) ×4
SUT VIC AB 2-0 CT1 TAPERPNT 27 (SUTURE) ×3 IMPLANT
SUT VLOC 180 0 24IN GS25 (SUTURE) ×2 IMPLANT
SYRINGE 20CC LL (MISCELLANEOUS) ×2 IMPLANT
TOWEL OR 17X26 10 PK STRL BLUE (TOWEL DISPOSABLE) ×3 IMPLANT
TOWER CARTRIDGE SMART MIX (DISPOSABLE) ×2 IMPLANT
TRAY FOLEY CATH 14FRSI W/METER (CATHETERS) ×2 IMPLANT
WATER STERILE IRR 1500ML POUR (IV SOLUTION) ×3 IMPLANT
WRAP KNEE MAXI GEL POST OP (GAUZE/BANDAGES/DRESSINGS) ×2 IMPLANT

## 2013-08-10 NOTE — H&P (Signed)
TOTAL KNEE ADMISSION H&P  Patient is being admitted for left total knee arthroplasty.  Subjective:  Chief Complaint:left knee pain.  HPI: Wendy Gilbert, 53 y.o. female, has a history of pain and functional disability in the left knee due to arthritis and has failed non-surgical conservative treatments for greater than 12 weeks to includeNSAID's and/or analgesics, corticosteriod injections, weight reduction as appropriate and activity modification.  Onset of symptoms was gradual, starting >10 years ago with gradually worsening course since that time. The patient noted prior procedures on the knee to include  arthroscopy and menisectomy on the left knee(s).  Patient currently rates pain in the left knee(s) at 7 out of 10 with activity. Patient has night pain, worsening of pain with activity and weight bearing, pain that interferes with activities of daily living, pain with passive range of motion, crepitus and joint swelling.  Patient has evidence of periarticular osteophytes and joint space narrowing by imaging studies.  There is no active infection.  Patient Active Problem List   Diagnosis Date Noted  . DVT (deep venous thrombosis) 09/14/2010  . Inflammatory carcinoma of right breast 08/01/2010   Past Medical History  Diagnosis Date  . DVT (deep venous thrombosis) 10/2008    left arm  . GERD (gastroesophageal reflux disease)   . Sinusitis   . PONV (postoperative nausea and vomiting)   . Headache(784.0)     sinus  . Arthritis     "all over"  . Inflammatory carcinoma of right breast 2010  . Seasonal allergies     Past Surgical History  Procedure Laterality Date  . Knee arthroscopy Left 11/1996  . Cesarean section  09/1999  . Dilation and curettage of uterus  1978  . Portacath placement  08/09/2008  . Incision and drainage abscess  10/25/2008    and debridement left axillary abscess, abd. wall abscess, left thigh abscess  . Port-a-cath removal Left 05/07/2012    Procedure: REMOVAL  PORT-A-CATH;  Surgeon: Odis Hollingshead, MD;  Location: [facility];  Service: General;  Laterality: Left;  . Tubal ligation    . Mastectomy Bilateral 02/23/2009    right modified radical, left simple  . Mastectomy  2011     bilateral      Current outpatient prescriptions: ibuprofen (ADVIL) 200 MG tablet, Take 600 mg by mouth every 6 (six) hours as needed for mild pain. , Disp: , Rfl: ;   omeprazole (PRILOSEC) 20 MG capsule, Take 20 mg by mouth daily.  , Disp: , Rfl:   Allergies  Allergen Reactions  . Demerol Other (See Comments)    MENTAL STATUS CHANGE  . Erythromycin Nausea And Vomiting  . Adhesive [Tape] Rash  . Cephalexin Hives and Other (See Comments)    ABD. PAIN  . Penicillins Other (See Comments)    UNKNOWN - WAS AS A CHILD    History  Substance Use Topics  . Smoking status: Never Smoker   . Smokeless tobacco: Never Used  . Alcohol Use: No    Family History  Problem Relation Age of Onset  . Breast cancer Mother   . Cancer Mother     breast, colon, ovarian  . Heart disease Father   . Cancer Paternal Uncle     pancreatic     Review of Systems  Constitutional: Positive for malaise/fatigue. Negative for fever, chills, weight loss and diaphoresis.  HENT: Negative.   Eyes: Negative.   Respiratory: Positive for shortness of breath. Negative for cough, hemoptysis, sputum production  and wheezing.        SOB with exertion  Cardiovascular: Negative.   Gastrointestinal: Positive for heartburn. Negative for nausea, vomiting, abdominal pain, diarrhea, constipation, blood in stool and melena.  Genitourinary: Negative.   Musculoskeletal: Positive for back pain, joint pain and myalgias. Negative for falls and neck pain.       Left knee pain  Skin: Negative.   Neurological: Positive for weakness. Negative for dizziness, tingling, tremors, sensory change, speech change, focal weakness, seizures and loss of consciousness.  Endo/Heme/Allergies: Positive for  environmental allergies. Negative for polydipsia. Does not bruise/bleed easily.  Psychiatric/Behavioral: Negative.     Objective:  Physical Exam  Constitutional: She is oriented to person, place, and time. She appears well-developed and well-nourished. No distress.  HENT:  Head: Normocephalic and atraumatic.  Right Ear: External ear normal.  Left Ear: External ear normal.  Nose: Nose normal.  Mouth/Throat: Oropharynx is clear and moist.  Poor dentition  Eyes: Conjunctivae and EOM are normal.  Neck: Normal range of motion. Neck supple.  Cardiovascular: Normal rate, normal heart sounds and intact distal pulses.  An irregularly irregular rhythm present.  No murmur heard. Respiratory: Effort normal and breath sounds normal. No respiratory distress. She has no wheezes.  GI: Soft. Bowel sounds are normal. She exhibits no distension. There is no tenderness.  Musculoskeletal:       Right hip: Normal.       Left hip: Normal.       Right knee: She exhibits decreased range of motion. She exhibits no swelling, no effusion and no erythema. No tenderness found.       Left knee: She exhibits decreased range of motion, swelling and effusion. She exhibits no erythema. Tenderness found. Medial joint line and lateral joint line tenderness noted.       Right lower leg: She exhibits no tenderness and no swelling.       Left lower leg: She exhibits no tenderness and no swelling.  Neurological: She is alert and oriented to person, place, and time. She has normal strength and normal reflexes. No sensory deficit.  Skin: No rash noted. She is not diaphoretic. No erythema.  Psychiatric: She has a normal mood and affect. Her behavior is normal.    Vitals Weight: 201.5 lb Height: 62.5 in Body Surface Area: 2.01 m Body Mass Index: 36.27 kg/m BP: 142/98 (Sitting, Left Arm, Standard)  HR: 88 bpm  Imaging Review Plain radiographs demonstrate severe degenerative joint disease of the left knee(s).  The overall alignment ismild varus. The bone quality appears to be adequate for age and reported activity level.  Assessment/Plan:  End stage arthritis, left knee   The patient history, physical examination, clinical judgment of the provider and imaging studies are consistent with end stage degenerative joint disease of the left knee(s) and total knee arthroplasty is deemed medically necessary. The treatment options including medical management, injection therapy arthroscopy and arthroplasty were discussed at length. The risks and benefits of total knee arthroplasty were presented and reviewed. The risks due to aseptic loosening, infection, stiffness, patella tracking problems, thromboembolic complications and other imponderables were discussed. The patient acknowledged the explanation, agreed to proceed with the plan and consent was signed. Patient is being admitted for inpatient treatment for surgery, pain control, PT, OT, prophylactic antibiotics, VTE prophylaxis, progressive ambulation and ADL's and discharge planning. The patient is planning to be discharged home with home health services     [place], [place]

## 2013-08-10 NOTE — Brief Op Note (Signed)
08/10/2013  2:28 PM  PATIENT:  Wendy Gilbert  53 y.o. female  PRE-OPERATIVE DIAGNOSIS:  OA LEFT KNEE and Morbid Obesity   POST-OPERATIVE DIAGNOSIS:  OA LEFT KNEE and Morbid Obesity  PROCEDURE:  Procedure(s): LEFT TOTAL KNEE ARTHROPLASTY (Left)  SURGEON:  Surgeon(s) and Role:    * Tobi Bastos, MD - Primary  PHYSICIAN ASSISTANT: Ardeen Jourdain PA  ASSISTANTS: Ardeen Jourdain PA  ANESTHESIA:   general  EBL:  Total I/O In: 1000 [I.V.:1000] Out: -   BLOOD ADMINISTERED:none  DRAINS: (One) Hemovact drain(s) in the Left Knee with  Suction Open   LOCAL MEDICATIONS USED:  BUPIVICAINE 20cc mixed with 20cc of Normal Saline  SPECIMEN:  No Specimen  DISPOSITION OF SPECIMEN:  N/A  COUNTS:  YES  TOURNIQUET:  * Missing tourniquet times found for documented tourniquets in log:  [id number] *  DICTATION: .Other Dictation: Dictation Number [id number]  PLAN OF CARE: Admit to inpatient   PATIENT DISPOSITION:  Stable in OR   Delay start of Pharmacological VTE agent (>24hrs) due to surgical blood loss or risk of bleeding: yes

## 2013-08-10 NOTE — Anesthesia Preprocedure Evaluation (Addendum)
Anesthesia Evaluation  Patient identified by MRN, date of birth, ID band Patient awake    Reviewed: Allergy & Precautions, H&P , NPO status , Patient's Chart, lab work & pertinent test results  History of Anesthesia Complications (+) PONV  Airway Mallampati: I TM Distance: >3 FB Neck ROM: Full    Dental  (+) Poor Dentition, Chipped, Missing, Dental Advisory Given,    Pulmonary neg pulmonary ROS,  breath sounds clear to auscultation  Pulmonary exam normal       Cardiovascular DVT (10/2008 left arm) Rhythm:Regular Rate:Normal     Neuro/Psych  Headaches, negative psych ROS   GI/Hepatic Neg liver ROS, GERD-  Medicated and Controlled,  Endo/Other  negative endocrine ROS  Renal/GU negative Renal ROS  negative genitourinary   Musculoskeletal negative musculoskeletal ROS (+)   Abdominal   Peds  Hematology negative hematology ROS (+)   Anesthesia Other Findings Bilateral mastectomy secondary CA  Reproductive/Obstetrics                         Anesthesia Physical Anesthesia Plan  ASA: II  Anesthesia Plan: General   Post-op Pain Management:    Induction: Intravenous  Airway Management Planned: Oral ETT  Additional Equipment:   Intra-op Plan:   Post-operative Plan: Extubation in OR  Informed Consent: I have reviewed the patients History and Physical, chart, labs and discussed the procedure including the risks, benefits and alternatives for the proposed anesthesia with the patient or authorized representative who has indicated his/her understanding and acceptance.   Dental advisory given  Plan Discussed with: CRNA  Anesthesia Plan Comments:         Anesthesia Quick Evaluation

## 2013-08-10 NOTE — Interval H&P Note (Signed)
History and Physical Interval Note:  08/10/2013 12:35 PM  Wendy Gilbert  has presented today for surgery, with the diagnosis of OA LEFT KNEE   The various methods of treatment have been discussed with the patient and family. After consideration of risks, benefits and other options for treatment, the patient has consented to  Procedure(s): LEFT TOTAL KNEE ARTHROPLASTY (Left) as a surgical intervention .  The patient's history has been reviewed, patient examined, no change in status, stable for surgery.  I have reviewed the patient's chart and labs.  Questions were answered to the patient's satisfaction.     [name] A

## 2013-08-10 NOTE — Transfer of Care (Signed)
Immediate Anesthesia Transfer of Care Note  Patient: Wendy Gilbert  Procedure(s) Performed: Procedure(s): LEFT TOTAL KNEE ARTHROPLASTY (Left)  Patient Location: PACU  Anesthesia Type:General  Level of Consciousness: sedated  Airway & Oxygen Therapy: Patient Spontanous Breathing and Patient connected to face mask oxygen  Post-op Assessment: Report given to PACU RN and Post -op Vital signs reviewed and stable  Post vital signs: Reviewed and stable  Complications: No apparent anesthesia complications

## 2013-08-11 ENCOUNTER — Encounter (HOSPITAL_COMMUNITY): Payer: Self-pay | Admitting: Orthopedic Surgery

## 2013-08-11 LAB — PROTIME-INR
INR: 1.14 (ref 0.00–1.49)
Prothrombin Time: 14.6 seconds (ref 11.6–15.2)

## 2013-08-11 LAB — CBC
HEMATOCRIT: 34.8 % — AB (ref 36.0–46.0)
HEMOGLOBIN: 11.3 g/dL — AB (ref 12.0–15.0)
MCH: 27.6 pg (ref 26.0–34.0)
MCHC: 32.5 g/dL (ref 30.0–36.0)
MCV: 85.1 fL (ref 78.0–100.0)
Platelets: 263 10*3/uL (ref 150–400)
RBC: 4.09 MIL/uL (ref 3.87–5.11)
RDW: 13.8 % (ref 11.5–15.5)
WBC: 11.3 10*3/uL — ABNORMAL HIGH (ref 4.0–10.5)

## 2013-08-11 LAB — BASIC METABOLIC PANEL
BUN: 28 mg/dL — ABNORMAL HIGH (ref 6–23)
CO2: 22 meq/L (ref 19–32)
Calcium: 8.3 mg/dL — ABNORMAL LOW (ref 8.4–10.5)
Chloride: 101 mEq/L (ref 96–112)
Creatinine, Ser: 1 mg/dL (ref 0.50–1.10)
GFR calc Af Amer: 72 mL/min — ABNORMAL LOW (ref >90)
GFR calc non Af Amer: 62 mL/min — ABNORMAL LOW (ref >90)
GLUCOSE: 173 mg/dL — AB (ref 70–99)
POTASSIUM: 4.5 meq/L (ref 3.7–5.3)
SODIUM: 139 meq/L (ref 137–147)

## 2013-08-11 MED ORDER — RIVAROXABAN 10 MG PO TABS
10.0000 mg | ORAL_TABLET | Freq: Every day | ORAL | Status: DC
  Administered 2013-08-11 – 2013-08-14 (×4): 10 mg via ORAL
  Filled 2013-08-11 (×4): qty 1

## 2013-08-11 MED ORDER — HYDROMORPHONE HCL 2 MG PO TABS
2.0000 mg | ORAL_TABLET | ORAL | Status: DC | PRN
  Administered 2013-08-11: 4 mg via ORAL
  Administered 2013-08-11: 2 mg via ORAL
  Administered 2013-08-11 – 2013-08-14 (×16): 4 mg via ORAL
  Filled 2013-08-11 (×11): qty 2
  Filled 2013-08-11: qty 1
  Filled 2013-08-11 (×6): qty 2

## 2013-08-11 MED ORDER — SODIUM CHLORIDE 0.9 % IV BOLUS (SEPSIS)
500.0000 mL | Freq: Once | INTRAVENOUS | Status: AC
  Administered 2013-08-11: 500 mL via INTRAVENOUS

## 2013-08-11 NOTE — Progress Notes (Signed)
   Subjective: 1 Day Post-Op Procedure(s) (LRB): LEFT TOTAL KNEE ARTHROPLASTY (Left) Patient reports pain as moderate.   Patient seen in rounds with Dr. Gladstone Lighter. Patient is well, and has had no acute complaints or problems other than pain in the left knee. She reports that she got little rest last night. No SOB or chest pain. No issues overnight.  Plan is to go Home after hospital stay.  Objective: Vital signs in last 24 hours: Temp:  [97.3 F (36.3 C)-98.8 F (37.1 C)] 98.3 F (36.8 C) (07/01 0616) Pulse Rate:  [82-116] 109 (07/01 0616) Resp:  [10-18] 16 (07/01 0616) BP: (110-152)/(55-95) 114/81 mmHg (07/01 0616) SpO2:  [93 %-100 %] 93 % (07/01 0137) Weight:  [92.08 kg (203 lb)] 92.08 kg (203 lb) (06/30 1640)  Intake/Output from previous day:  Intake/Output Summary (Last 24 hours) at 08/11/13 0810 Last data filed at 08/11/13 0617  Gross per 24 hour  Intake 2900.27 ml  Output   1630 ml  Net 1270.27 ml    Intake/Output this shift:    Labs:  Recent Labs  08/11/13 0405  HGB 11.3*    Recent Labs  08/11/13 0405  WBC 11.3*  RBC 4.09  HCT 34.8*  PLT 263    Recent Labs  08/11/13 0405  NA 139  K 4.5  CL 101  CO2 22  BUN 28*  CREATININE 1.00  GLUCOSE 173*  CALCIUM 8.3*    Recent Labs  08/11/13 0405  INR 1.14    EXAM General - Patient is Alert and Oriented Extremity - Neurologically intact Intact pulses distally Dorsiflexion/Plantar flexion intact Compartment soft Dressing - dressing C/D/I Motor Function - intact, moving foot and toes well on exam.  Hemovac pulled without difficulty.  Past Medical History  Diagnosis Date  . DVT (deep venous thrombosis) 10/2008    left arm  . GERD (gastroesophageal reflux disease)   . Sinusitis   . PONV (postoperative nausea and vomiting)   . Headache(784.0)     sinus  . Arthritis     "all over"  . Inflammatory carcinoma of right breast 2010  . Seasonal allergies     Assessment/Plan: 1 Day Post-Op  Procedure(s) (LRB): LEFT TOTAL KNEE ARTHROPLASTY (Left) Active Problems:   Osteoarthritis of left knee   Morbid obesity   Total knee replacement status  Estimated body mass index is 34.83 kg/(m^2) as calculated from the following:   Height as of this encounter: 5\' 4"  (1.626 m).   Weight as of this encounter: 92.08 kg (203 lb). Advance diet Up with therapy D/C IV fluids when tolerating POs well  DVT Prophylaxis - Xarelto Weight-Bearing as tolerated  Will start therapy today. Will increase Dilaudid. Continue to watch Hgb.   Ardeen Jourdain, PA-C Orthopaedic Surgery 08/11/2013, 8:10 AM

## 2013-08-11 NOTE — Progress Notes (Signed)
Patient tachy but asymptomatic.  Blood pressure and O2 saturation WNL.  Patient denies chest pain and SOB.  Merla Riches PA paged, new order received.  Will continue to monitor.

## 2013-08-11 NOTE — Anesthesia Postprocedure Evaluation (Signed)
Anesthesia Post Note  Patient: Wendy Gilbert  Procedure(s) Performed: Procedure(s) (LRB): LEFT TOTAL KNEE ARTHROPLASTY (Left)  Anesthesia type: General  Patient location: PACU  Post pain: Pain level controlled  Post assessment: Post-op Vital signs reviewed  Last Vitals:  Filed Vitals:   08/11/13 0616  BP: 114/81  Pulse: 109  Temp: 36.8 C  Resp: 16    Post vital signs: Reviewed  Level of consciousness: sedated  Complications: No apparent anesthesia complications

## 2013-08-11 NOTE — Evaluation (Signed)
Physical Therapy Evaluation Patient Details Name: Wendy Gilbert MRN: [id number] DOB: [birth year] Today's Date: 08/11/2013   History of Present Illness  L TKR  Clinical Impression  Pt s/p L TKR presents with decreased L LE strength/ROM and post op pain and nausea limiting functional mobility.  Pt should progress to d/c home with family assist and HHPT follow up.   Follow Up Recommendations Home health PT    Equipment Recommendations  Rolling walker with 5" wheels    Recommendations for Other Services OT consult     Precautions / Restrictions Precautions Precautions: Knee Required Braces or Orthoses: Knee Immobilizer - Left Knee Immobilizer - Left: Discontinue once straight leg raise with < 10 degree lag Restrictions Weight Bearing Restrictions: No Other Position/Activity Restrictions: WBAT      Mobility  Bed Mobility Overal bed mobility: Needs Assistance Bed Mobility: Supine to Sit     Supine to sit: Min assist;Mod assist     General bed mobility comments: cues for sequence and use of R LE to self assist  Transfers Overall transfer level: Needs assistance Equipment used: Rolling walker (2 wheeled) Transfers: Sit to/from Stand Sit to Stand: Min assist;Mod assist         General transfer comment: cues for LE management and use of UEs to self assist  Ambulation/Gait Ambulation/Gait assistance: Min assist Ambulation Distance (Feet): 17 Feet Assistive device: Rolling walker (2 wheeled) Gait Pattern/deviations: Step-to pattern;Decreased step length - right;Decreased step length - left;Shuffle;Trunk flexed Gait velocity: decr   General Gait Details: cues for sequence, posture, position from [employer]            Wheelchair Mobility    Modified Rankin (Stroke Patients Only)       Balance                                             Pertinent Vitals/Pain 6/10; premed, ice packs provided    Home Living Family/patient expects to be  discharged to:: Private residence Living Arrangements: Spouse/significant other Available Help at Discharge: Family Type of Home: House Home Access: Stairs to enter Entrance Stairs-Rails: Psychiatric nurse of Steps: 4 Home Layout: One level Home Equipment: None      Prior Function Level of Independence: Independent               Hand Dominance   Dominant Hand: Right    Extremity/Trunk Assessment   Upper Extremity Assessment: Overall WFL for tasks assessed           Lower Extremity Assessment: LLE deficits/detail;RLE deficits/detail RLE Deficits / Details: Strength WFL with knee flex ltd to ~50 LLE Deficits / Details: 2/5 quads with AAROM at knee -10 - 30  Cervical / Trunk Assessment: Normal  Communication   Communication: No difficulties  Cognition Arousal/Alertness: Awake/alert Behavior During Therapy: WFL for tasks assessed/performed Overall Cognitive Status: Within Functional Limits for tasks assessed                      General Comments      Exercises Total Joint Exercises Ankle Circles/Pumps: AROM;Both;10 reps;Supine Quad Sets: AROM;Both;10 reps;Supine Heel Slides: AAROM;Left;10 reps;Supine Straight Leg Raises: AAROM;Left;10 reps;Supine      Assessment/Plan    PT Assessment Patient needs continued PT services  PT Diagnosis Difficulty walking   PT Problem List Decreased strength;Decreased range of motion;Decreased activity tolerance;Decreased  mobility;Decreased knowledge of use of DME;Pain  PT Treatment Interventions DME instruction;Gait training;Stair training;Functional mobility training;Therapeutic activities;Therapeutic exercise;Patient/family education   PT Goals (Current goals can be found in the Care Plan section) Acute Rehab PT Goals Patient Stated Goal: Resume previous lifestyle with decreased pain PT Goal Formulation: With patient Time For Goal Achievement: 08/18/13 Potential to Achieve Goals: Good     Frequency 7X/week   Barriers to discharge        Co-evaluation               End of Session Equipment Utilized During Treatment: Gait belt;Left knee immobilizer Activity Tolerance: Patient tolerated treatment well Patient left: in chair;with call bell/phone within reach Nurse Communication: Mobility status         Time: 2956-2130 PT Time Calculation (min): 29 min   Charges:   PT Evaluation $Initial PT Evaluation Tier I: 1 Procedure PT Treatments $Gait Training: 8-22 mins $Therapeutic Exercise: 8-22 mins   PT G Codes:          Kaidin Boehle 08/11/2013, 12:06 PM

## 2013-08-11 NOTE — Progress Notes (Signed)
Moff[place], RN, BSN, CCM  [phone]  Chart Reviewed for discharge and hospital needs.  Discharge needs at time of review: None present will follow for needs.  Review of patient progress due on [id number].

## 2013-08-11 NOTE — Discharge Instructions (Addendum)
Information on my medicine - XARELTO (Rivaroxaban)  This medication education was reviewed with me or my healthcare representative as part of my discharge preparation.  The pharmacist that spoke with me during my hospital stay was:  Hershal Coria, [facility]  Why was Xarelto prescribed for you? Xarelto was prescribed for you to reduce the risk of blood clots forming after orthopedic surgery. The medical term for these abnormal blood clots is venous thromboembolism (VTE).  What do you need to know about xarelto ? Take your Xarelto ONCE DAILY at the same time every day. You may take it either with or without food.  If you have difficulty swallowing the tablet whole, you may crush it and mix in applesauce just prior to taking your dose.  Take Xarelto exactly as prescribed by your doctor and DO NOT stop taking Xarelto without talking to the doctor who prescribed the medication.  Stopping without other VTE prevention medication to take the place of Xarelto may increase your risk of developing a clot.  After discharge, you should have regular check-up appointments with your healthcare provider that is prescribing your Xarelto.    What do you do if you miss a dose? If you miss a dose, take it as soon as you remember on the same day then continue your regularly scheduled once daily regimen the next day. Do not take two doses of Xarelto on the same day.   Important Safety Information A possible side effect of Xarelto is bleeding. You should call your healthcare provider right away if you experience any of the following:   Bleeding from an injury or your nose that does not stop.   Unusual colored urine (red or dark brown) or unusual colored stools (red or black).   Unusual bruising for unknown reasons.   A serious fall or if you hit your head (even if there is no bleeding).  Some medicines may interact with Xarelto and might increase your risk of bleeding while on Xarelto. To help avoid this,  consult your healthcare provider or pharmacist prior to using any new prescription or non-prescription medications, including herbals, vitamins, non-steroidal anti-inflammatory drugs (NSAIDs) and supplements.  This website has more information on Xarelto: [email or web address].[email or web address]  Walk with your walker. Weight bearing as tolerated [place] will follow you at home for your therapy  Do not change your dressing unless there is excess drainage Shower only, no tub bath. Call if any temperatures greater than 101 or any wound complications: [phone] during the day and ask for Dr. Charlestine Night nurse, Brunilda Payor.

## 2013-08-11 NOTE — Op Note (Signed)
NAMECANDIDA, VETTER                  ACCOUNT NO.:  [id number]  MEDICAL RECORD NO.:  [id number]  LOCATION:  8563                         FACILITY:  [facility]  PHYSICIAN:  Kipp Brood. Merle Whitehorn, M.D.DATE OF BIRTH:  [birth year]  DATE OF PROCEDURE:  08/10/2013 DATE OF DISCHARGE:                              OPERATIVE REPORT   SURGEON:  Kipp Brood. Gladstone Lighter, M.D.  ASSISTANT:  Ardeen Jourdain, [place]  PREOPERATIVE DIAGNOSES: 1. Morbid obesity. 2. Severe bone-on-bone osteoarthritis of the left knee.  POSTOPERATIVE DIAGNOSES: 1. Morbid obesity. 2. Severe bone-on-bone osteoarthritis of the left knee.  OPERATION:  Left total knee arthroplasty utilizing DePuy system.  The femur with the femoral component was a size 3 left posterior cruciate sacrificing type.  The tibial tray was a size 2.5.  The insert was a rotating polyethylene insert, size 3 12.5 mm thickness.  The patella was a size 38 patella with 3 pegs.  Note all 3 components were cemented with the bone cement and gentamicin was included in the cement.  PROCEDURE:  Under general anesthesia, routine orthopedic prepping and draping of the left lower extremity was carried out.  Left leg was with appropriate time-out was first carried out.  I also marked the appropriate left leg in the holding area.  At this time, the left lower extremity was exsanguinated with an Esmarch, tourniquet was elevated at 350 mmHg.  The knee was flexed and an anterior approach of the knee was carried out followed by that.  Following that, I did a median parapatellar incision and reflected the patella laterally and excised the anterior and posterior cruciate ligaments.  I also did medial and lateral meniscectomies.  At this particular time, I then made my initial drill hole in the intercondylar notch of the femur.  The guide rod was inserted.  The knee was irrigated.  The canal was irrigated as well.  At this time, I then measured the femur.  I then removed 13 mm  thickness off the distal femur because of the contracture.  Also at this particular time, I measured the femur to be a size 3 left, we did cut the distal femur for a size 3 left, we cut it by making appropriate anterior, posterior, and chamfering cuts.  At this particular point, we then prepared the tibia in the usual fashion.  I removed 8 mm thickness off the affected medial side of the tibia.  These cuts were made because we wanted to get in the 12.5 mm thickness insert because of her age.  At that time, we then inserted the lamina spreaders and then removed some posterior spurs from the femur both laterally and medially.  At that time, we then went through trials and utilized a 12.5 mm thickness spacer blocks.  She had good flexion and good extension.  It was removed and then we went on and cut our keel cut out of the proximal tibial plateau.  We then did our notch cut out distal femur.  We inserted our trial components and had an excellent fit.  We did utilize a trial 12.5 mm thickness insert.  With the trial components in place, we then did a resurfacing procedure  on the patella for a size 38 patella.  Three drill holes were made in the articular surface of the patella.  We thoroughly irrigated out the area, removed all trial components, dried the knee out.  I then cemented all 3 components in simultaneously.  Once the cement was hardened, removed all loose pieces cement, waterpicked out the knee to make sure we had the cement removed.  We then inserted some thrombin-soaked Gelfoam, and then inserted our 12.5 mm thickness rotating platform size 3, reduced the knee and had excellent motion and excellent stability.  We then inserted a Hemovac drain in the usual fashion. Then, closed the wound in usual fashion.  Sterile dressings were applied.  The patient had 900 mg of clindamycin preop.  At the end of the procedure, we had a mixture of 20 mL of Exparel with 20 mL of saline that we  used.          ______________________________ Kipp Brood Gladstone Lighter, M.D.     RAG/MEDQ  D:  08/10/2013  T:  08/11/2013  Job:  [id number]

## 2013-08-11 NOTE — Evaluation (Signed)
Occupational Therapy Evaluation Patient Details Name: THOMAS RHUDE MRN: [id number] DOB: 1961-01-27 Today's Date: 08/11/2013    History of Present Illness L TKR   Clinical Impression   This 53 year old female was admitted for the above surgery.  She will benefit from skilled OT in acute to reinforce education re: bathroom transfers, ADLs, DME and AE.  She was independent with adls prior to admission. She c/o L knee also hurting and limiting her.    Follow Up Recommendations  No OT follow up    Equipment Recommendations  3 in 1 bedside comode    Recommendations for Other Services       Precautions / Restrictions Precautions Precautions: Knee Required Braces or Orthoses: Knee Immobilizer - Left Knee Immobilizer - Left: Discontinue once straight leg raise with < 10 degree lag Restrictions Other Position/Activity Restrictions: WBAT      Mobility Bed Mobility                  Transfers       Sit to Stand: Min assist         General transfer comment: from chair:  slow transition.  cues for UE/LE placement    Balance                                            ADL Overall ADL's : Needs assistance/impaired                         Toilet Transfer: Minimal assistance;BSC;Stand-pivot   Toileting- Clothing Manipulation and Hygiene: Minimal assistance;Sit to/from stand         General ADL Comments: Pt with increased pain:  can perform UB adls at set up level sitting but needs mod to max A for LB.  Pt has an inexpensive reacher at home:  explained ADLs uses.  Family will also assist her.  Educated on shower transfer:  will return tomorrow to practice     Vision                     Perception     Praxis      Pertinent Vitals/Pain 7/10 R knee.  Rn brought meds and repositioned with ice     Hand Dominance Right   Extremity/Trunk Assessment Upper Extremity Assessment Upper Extremity Assessment: Overall WFL for tasks  assessed           Communication Communication Communication: No difficulties   Cognition Arousal/Alertness: Awake/alert Behavior During Therapy: WFL for tasks assessed/performed Overall Cognitive Status: Within Functional Limits for tasks assessed                     General Comments       Exercises       Shoulder Instructions      Home Living Family/patient expects to be discharged to:: Private residence Living Arrangements: Spouse/significant other Available Help at Discharge: Family Type of Home: House Home Access: Stairs to enter Technical brewer of Steps: 4 Entrance Stairs-Rails: Right;Left Home Layout: One level     Bathroom Shower/Tub: Occupational psychologist: Standard     Home Equipment: None          Prior Functioning/Environment Level of Independence: Independent             OT Diagnosis: Generalized weakness  OT Problem List: Decreased strength;Decreased activity tolerance;Pain;Decreased knowledge of use of DME or AE   OT Treatment/Interventions: Self-care/ADL training;DME and/or AE instruction;Patient/family education    OT Goals(Current goals can be found in the care plan section) Acute Rehab OT Goals Patient Stated Goal: Resume previous lifestyle with decreased pain OT Goal Formulation: With patient Time For Goal Achievement: 08/18/13 Potential to Achieve Goals: Good ADL Goals Pt Will Perform Grooming: with supervision;standing Pt Will Transfer to Toilet: with min guard assist;bedside commode;ambulating Pt Will Perform Tub/Shower Transfer: Shower transfer;3 in 1;ambulating;with min guard assist Additional ADL Goal #1: pt will verbalize vs. demonstrate use of walker during adls  OT Frequency: Min 2X/week   Barriers to D/C:            Co-evaluation              End of Session    Activity Tolerance: Patient limited by pain Patient left: in chair;with call bell/phone within reach   Time:  1253-1323 OT Time Calculation (min): 30 min Charges:  OT General Charges $OT Visit: 1 Procedure OT Evaluation $Initial OT Evaluation Tier I: 1 Procedure OT Treatments $Self Care/Home Management : 8-22 mins G-Codes:    May Manrique Aug 13, 2013, 1:36 PM Lesle Chris, OTR/L [phone] 08/13/2013

## 2013-08-11 NOTE — Progress Notes (Signed)
Physical Therapy Treatment Patient Details Name: Wendy Gilbert MRN: [id number] DOB: June 01, 1960 Today's Date: 2013-09-04    History of Present Illness L TKR    PT Comments    Pt cooperative but with activity limited by pain.  Follow Up Recommendations  Home health PT     Equipment Recommendations  Rolling walker with 5" wheels    Recommendations for Other Services OT consult     Precautions / Restrictions Precautions Precautions: Knee Required Braces or Orthoses: Knee Immobilizer - Left Knee Immobilizer - Left: Discontinue once straight leg raise with < 10 degree lag Restrictions Weight Bearing Restrictions: No Other Position/Activity Restrictions: WBAT    Mobility  Bed Mobility Overal bed mobility: Needs Assistance Bed Mobility: Sit to Supine     Supine to sit: Min assist     General bed mobility comments: cues for sequence and use of R LE to self assist  Transfers Overall transfer level: Needs assistance Equipment used: Rolling walker (2 wheeled) Transfers: Sit to/from Stand Sit to Stand: Min assist         General transfer comment: from chair:  slow transition.  cues for UE/LE placement  Ambulation/Gait Ambulation/Gait assistance: Min assist;Mod assist Ambulation Distance (Feet): 15 Feet Assistive device: Rolling walker (2 wheeled) Gait Pattern/deviations: Step-to pattern;Decreased step length - right;Decreased step length - left;Shuffle;Trunk flexed Gait velocity: decr   General Gait Details: cues for sequence, posture, position from RW; pain limited   Stairs            Wheelchair Mobility    Modified Rankin (Stroke Patients Only)       Balance                                    Cognition Arousal/Alertness: Awake/alert Behavior During Therapy: WFL for tasks assessed/performed Overall Cognitive Status: Within Functional Limits for tasks assessed                      Exercises      General Comments         Pertinent Vitals/Pain 7/10; premed with oral and IV meds, ice packs provided, RN aware    Home Living Family/patient expects to be discharged to:: Private residence Living Arrangements: Spouse/significant other Available Help at Discharge: Family Type of Home: House Home Access: Stairs to enter Entrance Stairs-Rails: Right;Left Home Layout: One level Home Equipment: None      Prior Function Level of Independence: Independent          PT Goals (current goals can now be found in the care plan section) Acute Rehab PT Goals Patient Stated Goal: Resume previous lifestyle with decreased pain PT Goal Formulation: With patient Time For Goal Achievement: 08/18/13 Potential to Achieve Goals: Good Progress towards PT goals: Progressing toward goals    Frequency  7X/week    PT Plan Current plan remains appropriate    Co-evaluation             End of Session Equipment Utilized During Treatment: Gait belt;Left knee immobilizer Activity Tolerance: Patient limited by pain Patient left: in bed;with call bell/phone within reach;with family/visitor present     Time: 4166-0630 PT Time Calculation (min): 16 min  Charges:  $Gait Training: 8-22 mins                    G Codes:      Odus Clasby 09/04/2013, 4:27 PM

## 2013-08-12 ENCOUNTER — Encounter (HOSPITAL_COMMUNITY): Payer: Self-pay | Admitting: Radiology

## 2013-08-12 ENCOUNTER — Inpatient Hospital Stay (HOSPITAL_COMMUNITY): Payer: Medicare Other

## 2013-08-12 LAB — CBC
HEMATOCRIT: 31.2 % — AB (ref 36.0–46.0)
HEMOGLOBIN: 10 g/dL — AB (ref 12.0–15.0)
MCH: 27.7 pg (ref 26.0–34.0)
MCHC: 32.1 g/dL (ref 30.0–36.0)
MCV: 86.4 fL (ref 78.0–100.0)
Platelets: 207 10*3/uL (ref 150–400)
RBC: 3.61 MIL/uL — AB (ref 3.87–5.11)
RDW: 13.9 % (ref 11.5–15.5)
WBC: 11 10*3/uL — AB (ref 4.0–10.5)

## 2013-08-12 LAB — BASIC METABOLIC PANEL
ANION GAP: 12 (ref 5–15)
BUN: 20 mg/dL (ref 6–23)
CO2: 24 mEq/L (ref 19–32)
Calcium: 8.4 mg/dL (ref 8.4–10.5)
Chloride: 101 mEq/L (ref 96–112)
Creatinine, Ser: 0.75 mg/dL (ref 0.50–1.10)
GFR calc Af Amer: >90 mL/min (ref >90)
GFR calc non Af Amer: >90 mL/min (ref >90)
GLUCOSE: 162 mg/dL — AB (ref 70–99)
POTASSIUM: 4.1 meq/L (ref 3.7–5.3)
SODIUM: 137 meq/L (ref 137–147)

## 2013-08-12 LAB — PROTIME-INR
INR: 1.56 — AB (ref 0.00–1.49)
Prothrombin Time: 18.7 seconds — ABNORMAL HIGH (ref 11.6–15.2)

## 2013-08-12 MED ORDER — METHOCARBAMOL 500 MG PO TABS: 500.0000 mg | ORAL_TABLET | Freq: Four times a day (QID) | ORAL | Status: DC | PRN

## 2013-08-12 MED ORDER — FERROUS SULFATE 325 (65 FE) MG PO TABS: 325.0000 mg | ORAL_TABLET | Freq: Three times a day (TID) | ORAL | Status: DC

## 2013-08-12 MED ORDER — RIVAROXABAN 10 MG PO TABS: 10.0000 mg | ORAL_TABLET | Freq: Every day | ORAL | Status: DC

## 2013-08-12 MED ORDER — HYDROMORPHONE HCL 2 MG PO TABS: 2.0000 mg | ORAL_TABLET | ORAL | Status: DC | PRN

## 2013-08-12 MED ORDER — IOHEXOL 350 MG/ML SOLN
100.0000 mL | Freq: Once | INTRAVENOUS | Status: AC | PRN
  Administered 2013-08-12: 100 mL via INTRAVENOUS

## 2013-08-12 NOTE — Progress Notes (Signed)
Occupational Therapy Treatment Patient Details Name: Wendy Gilbert MRN: [id number] DOB: [birth year] Today's Date: 08/12/2013    History of present illness L TKR   OT comments  Pt moving better and pain better controlled today but HR increased and sats decreased  Follow Up Recommendations  No OT follow up    Equipment Recommendations  3 in 1 bedside comode    Recommendations for Other Services      Precautions / Restrictions Precautions Precautions: Knee Required Braces or Orthoses: Knee Immobilizer - Left Knee Immobilizer - Left: Discontinue once straight leg raise with < 10 degree lag Restrictions Other Position/Activity Restrictions: WBAT       Mobility Bed Mobility           Sit to supine: Min assist   General bed mobility comments: assist for RLE  Transfers       Sit to Stand: Min guard         General transfer comment: cues for RLE position for stand to sit    Balance                                   ADL                           Toilet Transfer: Min guard;BSC;Ambulation   Toileting- Clothing Manipulation and Hygiene: Minimal assistance;Sit to/from stand         General ADL Comments: Pt had ambulated to bathroom with PT.  OT assisted off commode and with hygiene.  Sats decreased and HR increased--returned to supine.  Reviewed reacher for adls.  Pt not ready for shower transfer yet      Vision                     Perception     Praxis      Cognition   Behavior During Therapy: [facility] for tasks assessed/performed Overall Cognitive Status: Within Functional Limits for tasks assessed                       Extremity/Trunk Assessment               Exercises     Shoulder Instructions       General Comments      Pertinent Vitals/ Pain       RLE 5/10; repositioned with ice.  Sats 92% on RA and HR 159 after returning from bathroom.  When settled in bed with 02 reapplied, sats 94% and HR  131:  Notified RN  Home Living                                          Prior Functioning/Environment              Frequency Min 2X/week     Progress Toward Goals  OT Goals(current goals can now be found in the care plan section)  Progress towards OT goals: Progressing toward goals  ADL Goals Additional ADL Goal #1: pt will verbalize vs. demo use of reacher during adls  Plan      Co-evaluation                 End of Session     Activity Tolerance Patient limited  by fatigue (and HR)   Patient Left in bed;with call bell/phone within reach   Nurse Communication          Time: [phone] OT Time Calculation (min): 9 min  Charges: OT General Charges $OT Visit: 1 Procedure OT Treatments $Self Care/Home Management : 8-22 mins  Wendy Gilbert 08/12/2013, 9:44 AM  Lesle Chris, OTR/L [phone] 08/12/2013

## 2013-08-12 NOTE — Progress Notes (Signed)
Subjective: 2 Days Post-Op Procedure(s) (LRB): LEFT TOTAL KNEE ARTHROPLASTY (Left) Patient reports pain as 3 on 0-10 scale. Doing well except for Tacycardia. Afebrile and no Shortness of breath or chest pain.  Objective: Vital signs in last 24 hours: Temp:  [98.3 F (36.8 C)-99 F (37.2 C)] 98.4 F (36.9 C) (07/02 0600) Pulse Rate:  [100-133] 128 (07/02 0600) Resp:  [16] 16 (07/02 0600) BP: (112-125)/(68-79) 125/75 mmHg (07/02 0600) SpO2:  [96 %-99 %] 96 % (07/02 0600)  Intake/Output from previous day: 07/01 0701 - 07/02 0700 In: 1100 [P.O.:600; IV Piggyback:500] Out: 1300 [Urine:1300] Intake/Output this shift:     Recent Labs  08/11/13 0405 08/12/13 0500  HGB 11.3* 10.0*    Recent Labs  08/11/13 0405 08/12/13 0500  WBC 11.3* 11.0*  RBC 4.09 3.61*  HCT 34.8* 31.2*  PLT 263 207    Recent Labs  08/11/13 0405 08/12/13 0500  NA 139 PENDING  K 4.5 PENDING  CL 101 PENDING  CO2 22 24  BUN 28* 20  CREATININE 1.00 0.75  GLUCOSE 173* 162*  CALCIUM 8.3* 8.4    Recent Labs  08/11/13 0405 08/12/13 0500  INR 1.14 1.56*    Dorsiflexion/Plantar flexion intact  Assessment/Plan: 2 Days Post-Op Procedure(s) (LRB): LEFT TOTAL KNEE ARTHROPLASTY (Left) Up with therapy No Follow-up on file. Tackycardia. DC Friday if stable.  Branson Kranz A 08/12/2013, 7:21 AM

## 2013-08-12 NOTE — Progress Notes (Signed)
Physical Therapy Treatment Patient Details Name: Wendy Gilbert MRN: [id number] DOB: 1960/09/14 Today's Date: 08/23/2013    History of Present Illness L TKR    PT Comments    Improvement in activity tolerance with better pain control  Follow Up Recommendations  Home health PT     Equipment Recommendations  Rolling walker with 5" wheels    Recommendations for Other Services OT consult     Precautions / Restrictions Precautions Precautions: Knee Required Braces or Orthoses: Knee Immobilizer - Left Knee Immobilizer - Left: Discontinue once straight leg raise with < 10 degree lag Restrictions Weight Bearing Restrictions: No Other Position/Activity Restrictions: WBAT    Mobility  Bed Mobility Overal bed mobility: Needs Assistance Bed Mobility: Supine to Sit;Sit to Supine     Supine to sit: Min assist Sit to supine: Min assist   General bed mobility comments: assist for RLE  Transfers Overall transfer level: Needs assistance Equipment used: Rolling walker (2 wheeled) Transfers: Sit to/from Stand Sit to Stand: Min assist         General transfer comment: cues for RLE position for stand to sit  Ambulation/Gait Ambulation/Gait assistance: Min assist Ambulation Distance (Feet): 33 Feet (and 15') Assistive device: Rolling walker (2 wheeled) Gait Pattern/deviations: Step-to pattern;Decreased step length - right;Decreased step length - left;Shuffle;Trunk flexed Gait velocity: decr   General Gait Details: cues for sequence, posture, position from RW; pain limited   Stairs            Wheelchair Mobility    Modified Rankin (Stroke Patients Only)       Balance                                    Cognition Arousal/Alertness: Awake/alert Behavior During Therapy: WFL for tasks assessed/performed Overall Cognitive Status: Within Functional Limits for tasks assessed                      Exercises Total Joint Exercises Ankle  Circles/Pumps: AROM;Both;Supine;20 reps Quad Sets: AROM;Both;Supine;15 reps Heel Slides: AAROM;Left;Supine;20 reps Straight Leg Raises: AAROM;Left;Supine;20 reps Goniometric ROM: AAROM at L knee -10 - 40    General Comments        Pertinent Vitals/Pain 6/10; premed, cold packs provided    Home Living                      Prior Function            PT Goals (current goals can now be found in the care plan section) Acute Rehab PT Goals Patient Stated Goal: Resume previous lifestyle with decreased pain PT Goal Formulation: With patient Time For Goal Achievement: 08/18/13 Potential to Achieve Goals: Good Progress towards PT goals: Progressing toward goals    Frequency  7X/week    PT Plan Current plan remains appropriate    Co-evaluation             End of Session Equipment Utilized During Treatment: Gait belt;Left knee immobilizer Activity Tolerance: Patient tolerated treatment well Patient left: in bed;with call bell/phone within reach;with family/visitor present     Time: 0820-0900 PT Time Calculation (min): 40 min  Charges:  $Gait Training: 8-22 mins $Therapeutic Exercise: 8-22 mins $Therapeutic Activity: 8-22 mins                    G Codes:      Jahir Halt Aug 23, 2013, 12:58  PM   

## 2013-08-12 NOTE — Progress Notes (Signed)
Physical Therapy Treatment Patient Details Name: LADORIS LYTHGOE MRN: [id number] DOB: [birth year] Today's Date: 2013-08-15    History of Present Illness L TKR    PT Comments    Pt motivated but ltd by elevated HR.  RN aware.  Follow Up Recommendations  Home health PT     Equipment Recommendations  Rolling walker with 5" wheels    Recommendations for Other Services OT consult     Precautions / Restrictions Precautions Precautions: Knee Required Braces or Orthoses: Knee Immobilizer - Left Knee Immobilizer - Left: Discontinue once straight leg raise with < 10 degree lag Restrictions Weight Bearing Restrictions: No Other Position/Activity Restrictions: WBAT    Mobility  Bed Mobility Overal bed mobility: Needs Assistance Bed Mobility: Supine to Sit;Sit to Supine     Supine to sit: Min assist Sit to supine: Min assist   General bed mobility comments: assist for RLE  Transfers Overall transfer level: Needs assistance Equipment used: Rolling walker (2 wheeled) Transfers: Sit to/from Stand Sit to Stand: Min guard         General transfer comment: cues for RLE position for stand to sit  Ambulation/Gait Ambulation/Gait assistance: Min assist Ambulation Distance (Feet): 45 Feet (and 15) Assistive device: Rolling walker (2 wheeled) Gait Pattern/deviations: Step-to pattern;Decreased step length - left;Decreased step length - right;Shuffle;Trunk flexed Gait velocity: decr   General Gait Details: cues for sequence, posture, position from RW; pain limited   Stairs            Wheelchair Mobility    Modified Rankin (Stroke Patients Only)       Balance                                    Cognition Arousal/Alertness: Awake/alert Behavior During Therapy: WFL for tasks assessed/performed Overall Cognitive Status: Within Functional Limits for tasks assessed                      Exercises      General Comments        Pertinent  Vitals/Pain 5/10; premed, ice packs provided.  HR elevated to 173 with OOB, SaO2 98%.  RN aware    Home Living                      Prior Function            PT Goals (current goals can now be found in the care plan section) Acute Rehab PT Goals Patient Stated Goal: Resume previous lifestyle with decreased pain PT Goal Formulation: With patient Time For Goal Achievement: 08/18/13 Potential to Achieve Goals: Good Progress towards PT goals: Progressing toward goals    Frequency  7X/week    PT Plan Current plan remains appropriate    Co-evaluation             End of Session Equipment Utilized During Treatment: Gait belt;Left knee immobilizer Activity Tolerance: Other (comment) (Elevated HR) Patient left: in bed;with call bell/phone within reach;with family/visitor present     Time: 7062-3762 PT Time Calculation (min): 30 min  Charges:  $Gait Training: 8-22 mins $Therapeutic Activity: 8-22 mins                    G Codes:      Loriene Taunton 08/15/13, 5:03 PM

## 2013-08-13 DIAGNOSIS — I498 Other specified cardiac arrhythmias: Secondary | ICD-10-CM

## 2013-08-13 DIAGNOSIS — R000 Tachycardia, unspecified: Secondary | ICD-10-CM

## 2013-08-13 DIAGNOSIS — M1710 Unilateral primary osteoarthritis, unspecified knee: Secondary | ICD-10-CM

## 2013-08-13 LAB — PROTIME-INR
INR: 1.47 (ref 0.00–1.49)
PROTHROMBIN TIME: 17.8 s — AB (ref 11.6–15.2)

## 2013-08-13 LAB — CBC
HCT: 29.3 % — ABNORMAL LOW (ref 36.0–46.0)
Hemoglobin: 9.6 g/dL — ABNORMAL LOW (ref 12.0–15.0)
MCH: 28.2 pg (ref 26.0–34.0)
MCHC: 32.8 g/dL (ref 30.0–36.0)
MCV: 85.9 fL (ref 78.0–100.0)
PLATELETS: 206 10*3/uL (ref 150–400)
RBC: 3.41 MIL/uL — ABNORMAL LOW (ref 3.87–5.11)
RDW: 14 % (ref 11.5–15.5)
WBC: 9.6 10*3/uL (ref 4.0–10.5)

## 2013-08-13 MED ORDER — SODIUM CHLORIDE 0.9 % IV BOLUS (SEPSIS)
500.0000 mL | Freq: Once | INTRAVENOUS | Status: AC
  Administered 2013-08-13: 500 mL via INTRAVENOUS

## 2013-08-13 MED ORDER — LIP MEDEX EX OINT
TOPICAL_OINTMENT | CUTANEOUS | Status: AC
  Administered 2013-08-13: 20:00:00
  Filled 2013-08-13: qty 7

## 2013-08-13 NOTE — Progress Notes (Signed)
08/13/13 1300  PT Visit Information  Last PT Received On 08/13/13  Assistance Needed +1  History of Present Illness L TKR  PT Time Calculation  PT Start Time 1225  PT Stop Time 1242  PT Time Calculation (min) 17 min  Subjective Data  Subjective i feel fine  Patient Stated Goal Resume previous lifestyle with decreased pain  Precautions  Precautions Knee  Required Braces or Orthoses Knee Immobilizer - Left  Knee Immobilizer - Left Discontinue once straight leg raise with < 10 degree lag  Restrictions  Other Position/Activity Restrictions WBAT  Cognition  Arousal/Alertness Awake/alert  Behavior During Therapy WFL for tasks assessed/performed  Overall Cognitive Status Within Functional Limits for tasks assessed  General Comments  General comments (skin integrity, edema, etc.) HR 94-113 with exercise this session; pain controlled and ice to knee after PT  Total Joint Exercises  Ankle Circles/Pumps AROM;Both;10 reps  Quad Sets AROM;Both;Supine;15 reps  Heel Slides AAROM;Left;Supine;15 reps  Straight Leg Raises AAROM;Left;Supine;15 reps  Short Arc [employer];Left;15 reps  Hip ABduction/ADduction AAROM;AROM;Left;10 reps  Goniometric ROM AROM ~8-50*  PT - End of Session  Activity Tolerance Patient tolerated treatment well  Patient left in bed;with call bell/phone within reach;with family/visitor present  Nurse Communication Mobility status  PT - Assessment/Plan  PT Plan Current plan remains appropriate  PT Frequency 7X/week  Follow Up Recommendations Home health PT  PT equipment Rolling walker with 5" wheels  PT Goal Progression  Progress towards PT goals Progressing toward goals  Acute Rehab PT Goals  PT Goal Formulation With patient  Time For Goal Achievement 08/18/13  Potential to Achieve Goals Good  PT General Charges  $$ ACUTE PT VISIT 1 Procedure  PT Treatments  $Therapeutic Exercise 8-22 mins

## 2013-08-13 NOTE — Consult Note (Addendum)
Requesting physician: Adrian Prince, PA  Primary Care Physician: [name], DO  Reason for consultation: Tachycardia   History of Present Illness: Patient is a 53 y/o woman admitted on 08/10/13 for the purpose of a left TKR. She has been doing well post-op, except for the RNs noticing an elevated HR especially when she is up with physical therapy. We have been consulted today for this reason. She denies and PMH other than prior breast cancer. EKG shows sinus tach with PACs. CT angio negative for PE.  Allergies:   Allergies  Allergen Reactions  . Demerol Other (See Comments)    MENTAL STATUS CHANGE  . Erythromycin Nausea And Vomiting  . Adhesive [Tape] Rash  . Cephalexin Hives and Other (See Comments)    ABD. PAIN  . Penicillins Other (See Comments)    UNKNOWN - WAS AS A CHILD      Past Medical History  Diagnosis Date  . DVT (deep venous thrombosis) 10/2008    left arm  . GERD (gastroesophageal reflux disease)   . Sinusitis   . PONV (postoperative nausea and vomiting)   . Headache(784.0)     sinus  . Arthritis     "all over"  . Seasonal allergies   . Inflammatory carcinoma of right breast dx'd 07/2008    Past Surgical History  Procedure Laterality Date  . Knee arthroscopy Left 11/1996  . Cesarean section  09/1999  . Dilation and curettage of uterus  1978  . Portacath placement  08/09/2008  . Incision and drainage abscess  10/25/2008    and debridement left axillary abscess, abd. wall abscess, left thigh abscess  . Port-a-cath removal Left 05/07/2012    Procedure: REMOVAL PORT-A-CATH;  Surgeon: Odis Hollingshead, MD;  Location: [place];  Service: General;  Laterality: Left;  . Tubal ligation    . Mastectomy Bilateral 02/23/2009    right modified radical, left simple  . Mastectomy  2011     bilateral   . Total knee arthroplasty Left 08/10/2013    Procedure: LEFT TOTAL KNEE ARTHROPLASTY;  Surgeon: Tobi Bastos, MD;  Location: [facility];   Service: Orthopedics;  Laterality: Left;    Scheduled Meds: . ferrous sulfate  325 mg Oral TID PC  . pantoprazole  40 mg Oral Daily  . rivaroxaban  10 mg Oral Daily  . sodium chloride  500 mL Intravenous Once   Continuous Infusions: . lactated ringers Stopped (08/11/13 0748)   PRN Meds:.acetaminophen, acetaminophen, alum & mag hydroxide-simeth, bisacodyl, HYDROmorphone (DILAUDID) injection, HYDROmorphone, menthol-cetylpyridinium, methocarbamol (ROBAXIN) IV, methocarbamol, ondansetron (ZOFRAN) IV, ondansetron, phenol, polyethylene glycol, promethazine  Social History:  reports that she has never smoked. She has never used smokeless tobacco. She reports that she does not drink alcohol or use illicit drugs.  Family History  Problem Relation Age of Onset  . Breast cancer Mother   . Cancer Mother     breast, colon, ovarian  . Heart disease Father   . Cancer Paternal Uncle     pancreatic    Review of Systems:  Constitutional: Denies fever, chills, diaphoresis, appetite change and fatigue.  HEENT: Denies photophobia, eye pain, redness, hearing loss, ear pain, congestion, sore throat, rhinorrhea, sneezing, mouth sores, trouble swallowing, neck pain, neck stiffness and tinnitus.   Respiratory: Denies SOB, DOE, cough, chest tightness,  and wheezing.   Cardiovascular: Denies chest pain, palpitations and leg swelling.  Gastrointestinal: Denies nausea, vomiting, abdominal pain, diarrhea, constipation, blood in stool and abdominal distention.  Genitourinary: Denies dysuria, urgency, frequency, hematuria, flank pain and difficulty urinating.  Endocrine: Denies: hot or cold intolerance, sweats, changes in hair or nails, polyuria, polydipsia. Musculoskeletal: Denies myalgias, back pain, joint swelling, arthralgias and gait problem.  Skin: Denies pallor, rash and wound.  Neurological: Denies dizziness, seizures, syncope, weakness, light-headedness, numbness and headaches.  Hematological: Denies  adenopathy. Easy bruising, personal or family bleeding history  Psychiatric/Behavioral: Denies suicidal ideation, mood changes, confusion, nervousness, sleep disturbance and agitation   Physical Exam: Blood pressure 107/74, pulse 96, temperature 98.2 F (36.8 C), temperature source Oral, resp. rate 16, height 5' 4"  (1.626 m), weight 92.08 kg (203 lb), SpO2 98.00%. Gen: AA Ox3, NAD HEENT: [place]/AT/PERRL/EOMI, somewhat dry mucous membranes. Neck: supple, no JVD, no LAD, no bruits, no goiter. CV: regular, no M/R/G Lungs: CTA B Abd: S/NT/ND/_BS Ext: no C/C/E Neuro: grossly intact and non-focal.  Labs on Admission:  Results for orders placed during the hospital encounter of 08/10/13 (from the past 48 hour(s))  PROTIME-INR     Status: Abnormal   Collection Time    08/12/13  5:00 AM      Result Value Ref Range   Prothrombin Time 18.7 (*) 11.6 - 15.2 seconds   INR 1.56 (*) 0.00 - 1.49  CBC     Status: Abnormal   Collection Time    08/12/13  5:00 AM      Result Value Ref Range   WBC 11.0 (*) 4.0 - 10.5 K/uL   RBC 3.61 (*) 3.87 - 5.11 MIL/uL   Hemoglobin 10.0 (*) 12.0 - 15.0 g/dL   HCT 31.2 (*) 36.0 - 46.0 %   MCV 86.4  78.0 - 100.0 fL   MCH 27.7  26.0 - 34.0 pg   MCHC 32.1  30.0 - 36.0 g/dL   RDW 13.9  11.5 - 15.5 %   Platelets 207  150 - 400 K/uL  BASIC METABOLIC PANEL     Status: Abnormal   Collection Time    08/12/13  5:00 AM      Result Value Ref Range   Sodium 137  137 - 147 mEq/L   Potassium 4.1  3.7 - 5.3 mEq/L   Chloride 101  96 - 112 mEq/L   CO2 24  19 - 32 mEq/L   Glucose, Bld 162 (*) 70 - 99 mg/dL   BUN 20  6 - 23 mg/dL   Creatinine, Ser 0.75  0.50 - 1.10 mg/dL   Calcium 8.4  8.4 - 10.5 mg/dL   GFR calc non Af Amer >90  >90 mL/min   GFR calc Af Amer >90  >90 mL/min   Comment: (NOTE)     The eGFR has been calculated using the CKD EPI equation.     This calculation has not been validated in all clinical situations.     eGFR's persistently <90 mL/min signify possible  Chronic Kidney     Disease.   Anion gap 12  5 - 15  PROTIME-INR     Status: Abnormal   Collection Time    08/13/13  4:59 AM      Result Value Ref Range   Prothrombin Time 17.8 (*) 11.6 - 15.2 seconds   INR 1.47  0.00 - 1.49  CBC     Status: Abnormal   Collection Time    08/13/13  4:59 AM      Result Value Ref Range   WBC 9.6  4.0 - 10.5 K/uL   RBC 3.41 (*) 3.87 - 5.11 MIL/uL  Hemoglobin 9.6 (*) 12.0 - 15.0 g/dL   HCT 29.3 (*) 36.0 - 46.0 %   MCV 85.9  78.0 - 100.0 fL   MCH 28.2  26.0 - 34.0 pg   MCHC 32.8  30.0 - 36.0 g/dL   RDW 14.0  11.5 - 15.5 %   Platelets 206  150 - 400 K/uL    Radiological Exams on Admission: Ct Angio Chest Pe W/cm &/or Wo Cm  08/12/2013   CLINICAL DATA:  tachy and O2 desat s/p total knee; rule out PE, history breast cancer  EXAM: CT ANGIOGRAPHY CHEST WITH CONTRAST  TECHNIQUE: Multidetector CT imaging of the chest was performed using the standard protocol during bolus administration of intravenous contrast. Multiplanar CT image reconstructions and MIPs were obtained to evaluate the vascular anatomy.  CONTRAST:  145m OMNIPAQUE IOHEXOL 350 MG/ML SOLN  COMPARISON:  Two-view chest 08/02/2013, and PET-CT 01/02/2009  FINDINGS: The thoracic inlet is unremarkable.  No mediastinal nor hilar masses or adenopathy. Diffuse wall thickening in the mid to lower portion of the esophagus. A small hiatal hernia is appreciated.  No filling defects within the main, lobar, or segmental pulmonary arteries. No thoracic aortic aneurysm nor dissection.  The lungs are clear. The central airways are patent. Postsurgical changes in the axillary tail region of the right breast.  Diffuse low attenuation throughout the liver. Remaining visualized upper abdominal viscera are unremarkable.  There no aggressive appearing osseous lesions. Modic endplate changes are appreciated within the spine.  Review of the MIP images confirms the above findings.  IMPRESSION: No CT evidence of pulmonary arterial  embolic disease.  Diffuse thickening of the mid and lower esophageal wall. Dx may represent sequela of esophagitis. Clinical correlation recommended. Clinically warranted further evaluation with direct visualization recommended.  Hepatic steatosis  No further focal or acute thoracic abnormalities.   Electronically Signed   By: HMargaree MackintoshM.D.   On: 08/12/2013 12:41    Assessment/Plan Active Problems:   Osteoarthritis of left knee   Morbid obesity   Total knee replacement status   Sinus tachycardia   Left Knee Osteoarthritis -s/p TKR. -Management as per ortho.  Sinus Tachycardia -Typically a response to pain, fever, dehydration. -Reports only mild pain. -Seems a little dry on exam (will give a 500 cc bolus). -Hb stable at 9.6 (?estimated blood loss during surgery). Certainly no need for transfusion at present. -Currently her rate is controlled. -Check HR q 4 hours.  Morbid Obesity   Time Spent on Consultation: 65 minutes  HERNANDEZ ACOSTA,Velda Wendt [facility]  [phone]/04/2013, 2:48 PM

## 2013-08-13 NOTE — Progress Notes (Signed)
Physical Therapy Treatment Patient Details Name: Wendy Gilbert MRN: [id number] DOB: [birth year] Today's Date: 08/13/2013    History of Present Illness L TKR    PT Comments    Progressing although limited by elevated HR, not as much as yesterday but up to 144 with amb x 32'; have discussed with RN, EKG done this am, pt denies CP during PT session   Follow Up Recommendations  Home health PT     Equipment Recommendations  Rolling walker with 5" wheels    Recommendations for Other Services       Precautions / Restrictions Precautions Precautions: Knee Required Braces or Orthoses: Knee Immobilizer - Left Knee Immobilizer - Left: Discontinue once straight leg raise with < 10 degree lag Restrictions Other Position/Activity Restrictions: WBAT    Mobility  Bed Mobility Overal bed mobility: Needs Assistance Bed Mobility: Supine to Sit     Supine to sit: Min assist;Min guard     General bed mobility comments: assist for RLE  Transfers Overall transfer level: Needs assistance Equipment used: Rolling walker (2 wheeled) Transfers: Sit to/from Stand Sit to Stand: Min guard         General transfer comment: cues for RLE position for stand to sit  Ambulation/Gait Ambulation/Gait assistance: Min guard Ambulation Distance (Feet): 32 Feet Assistive device: Rolling walker (2 wheeled) Gait Pattern/deviations: Step-to pattern;Decreased step length - right;Decreased step length - left Gait velocity: decr   General Gait Details: cues for sequence, posture, position from RW; pain limited   Stairs            Wheelchair Mobility    Modified Rankin (Stroke Patients Only)       Balance                                    Cognition Arousal/Alertness: Awake/alert Behavior During Therapy: WFL for tasks assessed/performed Overall Cognitive Status: Within Functional Limits for tasks assessed                      Exercises Total Joint  Exercises Ankle Circles/Pumps: AROM;Both;10 reps Quad Sets: AROM;Both;Supine;15 reps    General Comments        Pertinent Vitals/Pain Pain controlled; ice to knee after PT\ HR 104-114 at rest 122 with basic ther ex 144 with amb 120 after ~ 2 min rest sats >94% on RA    Home Living                      Prior Function            PT Goals (current goals can now be found in the care plan section) Acute Rehab PT Goals Patient Stated Goal: Resume previous lifestyle with decreased pain PT Goal Formulation: With patient Time For Goal Achievement: 08/18/13 Potential to Achieve Goals: Good Progress towards PT goals: Progressing toward goals    Frequency  7X/week    PT Plan Current plan remains appropriate    Co-evaluation             End of Session Equipment Utilized During Treatment: Gait belt;Left knee immobilizer Activity Tolerance: Other (comment);Treatment limited secondary to medical complications (Comment) (elevated HR) Patient left: in chair;with call bell/phone within reach     Time: 1049-1107 PT Time Calculation (min): 18 min  Charges:  $Gait Training: 8-22 mins  G Codes:      Wendy Gilbert 08/13/2013, 12:00 PM

## 2013-08-13 NOTE — Progress Notes (Signed)
   Subjective: 3 Days Post-Op Procedure(s) (LRB): LEFT TOTAL KNEE ARTHROPLASTY (Left)   Patient reports pain as mild, pain controlled. She has had an elevated HR, which patient states it reach 170's with PT. Also states that she doesn't have CP, but not feeling exactly right.  Objective:   VITALS:   Filed Vitals:   08/13/13 0551  BP: 125/78  Pulse: 116  Temp: 98.8 F (37.1 C)  Resp: 15    Dorsiflexion/Plantar flexion intact Incision: scant drainage No cellulitis present Compartment soft  LABS  Recent Labs  08/11/13 0405 08/12/13 0500 08/13/13 0459  HGB 11.3* 10.0* 9.6*  HCT 34.8* 31.2* 29.3*  WBC 11.3* 11.0* 9.6  PLT 263 207 206     Recent Labs  08/11/13 0405 08/12/13 0500  NA 139 137  K 4.5 4.1  BUN 28* 20  CREATININE 1.00 0.75  GLUCOSE 173* 162*     Assessment/Plan: 3 Days Post-Op Procedure(s) (LRB): LEFT TOTAL KNEE ARTHROPLASTY (Left) Up with therapy, if tolerated Talked with Hospitalist, who will see the patient later for evaluation EKG ordered. Discharge home with home health, eventually when ready      [place] Pugh. Ramsey Guadamuz   PAC  08/13/2013, 8:44 AM

## 2013-08-14 LAB — PROTIME-INR
INR: 1.57 — AB (ref 0.00–1.49)
Prothrombin Time: 18.8 seconds — ABNORMAL HIGH (ref 11.6–15.2)

## 2013-08-14 NOTE — Progress Notes (Signed)
Physical Therapy Treatment Patient Details Name: Wendy Gilbert MRN: [id number] DOB: 1960/07/04 Today's Date: 08/14/2013    History of Present Illness L TKR    PT Comments    Pt doing well  Follow Up Recommendations  Home health PT     Equipment Recommendations  Rolling walker with 5" wheels    Recommendations for Other Services OT consult     Precautions / Restrictions Precautions Precautions: Knee Required Braces or Orthoses: Knee Immobilizer - Left Knee Immobilizer - Left: Discontinue once straight leg raise with < 10 degree lag Restrictions Other Position/Activity Restrictions: WBAT    Mobility  Bed Mobility Overal bed mobility: Needs Assistance Bed Mobility: Supine to Sit     Supine to sit: Min assist;Min guard     General bed mobility comments: assist for RLE  Transfers Overall transfer level: Needs assistance Equipment used: Rolling walker (2 wheeled) Transfers: Sit to/from Stand Sit to Stand: Supervision         General transfer comment: cues for RLE position for stand to sit  Ambulation/Gait Ambulation/Gait assistance: Supervision Ambulation Distance (Feet): 10 Feet Assistive device: Rolling walker (2 wheeled) Gait Pattern/deviations: Step-to pattern     General Gait Details: cues for sequence, posture, position from RW;    Stairs Stairs: Yes Stairs assistance: Min assist Stair Management: One rail Left;One rail Right;Sideways;Step to pattern Number of Stairs: 4 General stair comments: incr time and cues for sequence  Wheelchair Mobility    Modified Rankin (Stroke Patients Only)       Balance                                    Cognition Arousal/Alertness: Awake/alert Behavior During Therapy: WFL for tasks assessed/performed Overall Cognitive Status: Within Functional Limits for tasks assessed                      Exercises Total Joint Exercises Ankle Circles/Pumps: AROM;Both;10 reps Quad Sets:  AROM;Both;Supine;15 reps Short Arc QuadSinclair Gilbert;Left;15 reps Heel Slides: AAROM;Left;Supine;15 reps (knee AAROM~ 5-55) Hip ABduction/ADduction: AAROM;AROM;Left;10 reps Straight Leg Raises: AAROM;Left;15 reps    General Comments General comments (skin integrity, edema, etc.): pt assisted with shower transfer; min assist and cues for technique (up/down one step with RW); tub seat placed for pt for safety; RN and NT aware      Pertinent Vitals/Pain     Home Living                      Prior Function            PT Goals (current goals can now be found in the care plan section) Acute Rehab PT Goals Patient Stated Goal: Resume previous lifestyle with decreased pain PT Goal Formulation: With patient Time For Goal Achievement: 08/18/13 Potential to Achieve Goals: Good Progress towards PT goals: Progressing toward goals    Frequency  7X/week    PT Plan Current plan remains appropriate    Co-evaluation             End of Session Equipment Utilized During Treatment: Gait belt;Left knee immobilizer Activity Tolerance: Patient tolerated treatment well Patient left: Other (comment);with call bell/phone within reach;with family/visitor present (pt in shower)     Time: 1145 ([phone])-1159 PT Time Calculation (min): 14 min  Charges:   $Therapeutic Exercise: 8-22 mins $Therapeutic Activity: 8-22 mins  G CodesKenyon Gilbert 08/14/2013, 1:23 PM

## 2013-08-14 NOTE — Progress Notes (Signed)
Patient discharged to home, reviewed discharge paperwork and gave patient prescriptions.  Patient verbalizes understanding.  Patient escorted off unit with NT via wheelchair.  Christen Bame RN

## 2013-08-14 NOTE — Progress Notes (Addendum)
Physical Therapy Treatment Patient Details Name: Wendy Gilbert MRN: [id number] DOB: 14-May-1960 Today's Date: 2013/09/12    History of Present Illness L TKR    PT Comments    Pt progressing well; feels ready to go home; HR still elevated but less so than previous 2 days; pain Left knee pain 5/10, had meds before session but none since midnight before that  Follow Up Recommendations  Home health PT     Equipment Recommendations  Rolling walker with 5" wheels    Recommendations for Other Services OT consult     Precautions / Restrictions Precautions Precautions: Knee Required Braces or Orthoses: Knee Immobilizer - Left Knee Immobilizer - Left: Discontinue once straight leg raise with < 10 degree lag Restrictions Other Position/Activity Restrictions: WBAT    Mobility  Bed Mobility Overal bed mobility: Needs Assistance Bed Mobility: Supine to Sit     Supine to sit: Min assist;Min guard     General bed mobility comments: assist for RLE  Transfers Overall transfer level: Needs assistance Equipment used: Rolling walker (2 wheeled) Transfers: Sit to/from Stand Sit to Stand: Supervision (x4)         General transfer comment: cues for RLE position for stand to sit  Ambulation/Gait Ambulation/Gait assistance: Supervision;Min guard Ambulation Distance (Feet): 60 Feet (20' and 15') Assistive device: Rolling walker (2 wheeled) Gait Pattern/deviations: Step-to pattern;Antalgic     General Gait Details: cues for sequence, posture, position from RW; pain limited   Stairs Stairs: Yes Stairs assistance: Min assist Stair Management: One rail Left;One rail Right;Sideways;Step to pattern Number of Stairs: 4 General stair comments: incr time and cues for sequence  Wheelchair Mobility    Modified Rankin (Stroke Patients Only)       Balance                                    Cognition Arousal/Alertness: Awake/alert Behavior During Therapy: WFL for  tasks assessed/performed Overall Cognitive Status: Within Functional Limits for tasks assessed                      Exercises      General Comments General comments (skin integrity, edema, etc.): HR 100-132 after amb      Pertinent Vitals/Pain     Home Living                      Prior Function            PT Goals (current goals can now be found in the care plan section) Acute Rehab PT Goals Patient Stated Goal: Resume previous lifestyle with decreased pain PT Goal Formulation: With patient Time For Goal Achievement: 08/18/13 Potential to Achieve Goals: Good Progress towards PT goals: Progressing toward goals    Frequency  7X/week    PT Plan Current plan remains appropriate    Co-evaluation             End of Session Equipment Utilized During Treatment: Gait belt;Left knee immobilizer Activity Tolerance: Patient tolerated treatment well Patient left: in chair;with call bell/phone within reach     Time: 7169-6789 PT Time Calculation (min): 35 min  Charges:  $Gait Training: 2                    G CodesKenyon Ana Sep 12, 2013, 10:40 AM

## 2013-08-14 NOTE — Progress Notes (Signed)
CARE MANAGEMENT NOTE 08/14/2013  Patient:  Wendy Gilbert, Wendy Gilbert   Account Number:  [id number]  Date Initiated:  08/11/2013  Documentation initiated by:  DAVIS,RHONDA  Subjective/Objective Assessment:   left total knee replacement     Action/Plan:   home with hhc   Anticipated DC Date:  08/14/2013   Anticipated DC Plan:  [place]  In-house referral  NA      DC Planning Services  CM consult      [facility] Choice  NA   Choice offered to / List presented to:  C-1 Patient   DME arranged  Vassie Moselle      DME agency  [place] arranged  [place].   Status of service:  Completed, signed off Medicare Important Message given?  YES (If response is "NO", the following Medicare IM given date fields will be blank) Date Medicare IM given:  08/14/2013 Medicare IM given by:  [facility] Date Additional Medicare IM given:   Additional Medicare IM given by:    Discharge Disposition:  [place]  Per UR Regulation:  Reviewed for med. necessity/level of care/duration of stay  If discussed at [place] of Stay Meetings, dates discussed:    Comments:  08/14/2013 1200 NCM spoke to pt and offered choice for [facility]. Pt states she has [facility] for [facility] in the past. She is requesting a RW for home. Pt states she cannot afford Xarelto. Activated a 30 day free trial card for Xarelto. [facility] provided RW for home. Jonnie Finner RN CCM Case Mgmt phone [phone]  [phone] Eldridge Dace, [place], [place] [phone] Chart Reviewed for discharge and hospital needs. Discharge needs at time of review: None present will follow for needs. Review of patient progress due on [id number].

## 2013-08-14 NOTE — Progress Notes (Signed)
   Subjective: 4 Days Post-Op Procedure(s) (LRB): LEFT TOTAL KNEE ARTHROPLASTY (Left)   Patient reports pain as mild, pain controlled. No events throughout the night. Appreciate Dr. Jerilee Hoh for seeing the patient. The patient realizes that she is try and knows that she needs to stay hydrated in order to help the healing process. Feels that she is doing well enough to be discharged home.  Objective:   VITALS:   Filed Vitals:   08/14/13 0530  BP: 108/72  Pulse: 104  Temp: 98.5 F (36.9 C)  Resp: 18    Neurovascular intact Dorsiflexion/Plantar flexion intact Incision: dressing C/D/I No cellulitis present Compartment soft  LABS  Recent Labs  08/12/13 0500 08/13/13 0459  HGB 10.0* 9.6*  HCT 31.2* 29.3*  WBC 11.0* 9.6  PLT 207 206     Recent Labs  08/12/13 0500  NA 137  K 4.1  BUN 20  CREATININE 0.75  GLUCOSE 162*     Assessment/Plan: 4 Days Post-Op Procedure(s) (LRB): LEFT TOTAL KNEE ARTHROPLASTY (Left) Up with therapy Discharge home with home health Follow up in 2 weeks at [facility]. Follow up with Dr. Gladstone Lighter in 2 weeks.  Contact information:  [facility] [address], [address] [place] Wendy Gilbert   PAC  08/14/2013, 7:15 AM

## 2013-08-15 NOTE — Progress Notes (Signed)
CARE MANAGEMENT NOTE 08/15/2013  Patient:  Wendy Gilbert, Wendy Gilbert   Account Number:  [id number]  Date Initiated:  08/11/2013  Documentation initiated by:  DAVIS,RHONDA  Subjective/Objective Assessment:   left total knee replacement     Action/Plan:   home with hhc   Anticipated DC Date:  08/14/2013   Anticipated DC Plan:  [place]  In-house referral  NA      DC Planning Services  CM consult  [place] [facility]      [facility] Choice  NA   Choice offered to / List presented to:  C-1 Patient   DME arranged  Vassie Moselle      DME agency  [place] arranged  [place].   Status of service:  Completed, signed off Medicare Important Message given?  YES (If response is "NO", the following Medicare IM given date fields will be blank) Date Medicare IM given:  08/14/2013 Medicare IM given by:  [facility] Date Additional Medicare IM given:   Additional Medicare IM given by:    Discharge Disposition:  [place]  Per UR Regulation:  Reviewed for med. necessity/level of care/duration of stay  If discussed at [place] of Stay Meetings, dates discussed:    Comments:  08/15/2013 1530 NCM received a message from pt and [facility] could not use 30 day trial card. [facility]ed [facility] [place]. [facility] and NCM faxed [place] letter to [place], # [phone]. Notified pt to pick up Xarelto from [facility] for $3.00. Jonnie Finner RN CCM Case Mgmt phone [phone]  08/14/2013 1200 NCM spoke to pt and offered choice for [facility]. Pt states she has [facility] for [facility] in the past. She is requesting a RW for home. Pt states she cannot afford Xarelto. Activated a 30 day free trial card for Xarelto. [facility] provided RW for home. Jonnie Finner RN CCM Case Mgmt phone [phone]  [phone] Eldridge Dace, [place], [place] [phone] Chart Reviewed for discharge and hospital needs. Discharge needs at time of review: None present  will follow for needs. Review of patient progress due on [id number].

## 2013-08-16 NOTE — Discharge Summary (Signed)
Physician Discharge Summary   Patient ID: Wendy Gilbert MRN: [id number] DOB/AGE: 1960/03/08 53 y.o.  Admit date: 08/10/2013 Discharge date: 08/14/2013  Primary Diagnosis: Osteoarthritis, left knee  Admission Diagnoses:  Past Medical History  Diagnosis Date  . DVT (deep venous thrombosis) 10/2008    left arm  . GERD (gastroesophageal reflux disease)   . Sinusitis   . PONV (postoperative nausea and vomiting)   . Headache(784.0)     sinus  . Arthritis     "all over"  . Seasonal allergies   . Inflammatory carcinoma of right breast dx'd 07/2008   Discharge Diagnoses:   Active Problems:   Osteoarthritis of left knee   Morbid obesity   Total knee replacement status   Sinus tachycardia  Estimated body mass index is 34.83 kg/(m^2) as calculated from the following:   Height as of this encounter: 5' 4"  (1.626 m).   Weight as of this encounter: 92.08 kg (203 lb).  Procedure:  Procedure(s) (LRB): LEFT TOTAL KNEE ARTHROPLASTY (Left)   Consults: Internal medicine  HPI: Wendy Gilbert, 53 y.o. female, has a history of pain and functional disability in the left knee due to arthritis and has failed non-surgical conservative treatments for greater than 12 weeks to includeNSAID's and/or analgesics, corticosteriod injections, weight reduction as appropriate and activity modification. Onset of symptoms was gradual, starting >10 years ago with gradually worsening course since that time. The patient noted prior procedures on the knee to include arthroscopy and menisectomy on the left knee(s). Patient currently rates pain in the left knee(s) at 7 out of 10 with activity. Patient has night pain, worsening of pain with activity and weight bearing, pain that interferes with activities of daily living, pain with passive range of motion, crepitus and joint swelling. Patient has evidence of periarticular osteophytes and joint space narrowing by imaging studies. There is no active infection.  Laboratory  Data: Admission on 08/10/2013, Discharged on 08/14/2013  Component Date Value Ref Range Status  . Prothrombin Time 08/11/2013 14.6  11.6 - 15.2 seconds Final  . INR 08/11/2013 1.14  0.00 - 1.49 Final  . WBC 08/11/2013 11.3* 4.0 - 10.5 K/uL Final  . RBC 08/11/2013 4.09  3.87 - 5.11 MIL/uL Final  . Hemoglobin 08/11/2013 11.3* 12.0 - 15.0 g/dL Final  . HCT 08/11/2013 34.8* 36.0 - 46.0 % Final  . MCV 08/11/2013 85.1  78.0 - 100.0 fL Final  . MCH 08/11/2013 27.6  26.0 - 34.0 pg Final  . MCHC 08/11/2013 32.5  30.0 - 36.0 g/dL Final  . RDW 08/11/2013 13.8  11.5 - 15.5 % Final  . Platelets 08/11/2013 263  150 - 400 K/uL Final  . Sodium 08/11/2013 139  137 - 147 mEq/L Final  . Potassium 08/11/2013 4.5  3.7 - 5.3 mEq/L Final  . Chloride 08/11/2013 101  96 - 112 mEq/L Final  . CO2 08/11/2013 22  19 - 32 mEq/L Final  . Glucose, Bld 08/11/2013 173* 70 - 99 mg/dL Final  . BUN 08/11/2013 28* 6 - 23 mg/dL Final  . Creatinine, Ser 08/11/2013 1.00  0.50 - 1.10 mg/dL Final  . Calcium 08/11/2013 8.3* 8.4 - 10.5 mg/dL Final  . GFR calc non Af Amer 08/11/2013 62* >90 mL/min Final  . GFR calc Af Amer 08/11/2013 72* >90 mL/min Final   Comment: (NOTE)                          The eGFR has been  calculated using the CKD EPI equation.                          This calculation has not been validated in all clinical situations.                          eGFR's persistently <90 mL/min signify possible Chronic Kidney                          Disease.  Marland Kitchen Prothrombin Time 08/12/2013 18.7* 11.6 - 15.2 seconds Final  . INR 08/12/2013 1.56* 0.00 - 1.49 Final  . WBC 08/12/2013 11.0* 4.0 - 10.5 K/uL Final  . RBC 08/12/2013 3.61* 3.87 - 5.11 MIL/uL Final  . Hemoglobin 08/12/2013 10.0* 12.0 - 15.0 g/dL Final  . HCT 08/12/2013 31.2* 36.0 - 46.0 % Final  . MCV 08/12/2013 86.4  78.0 - 100.0 fL Final  . MCH 08/12/2013 27.7  26.0 - 34.0 pg Final  . MCHC 08/12/2013 32.1  30.0 - 36.0 g/dL Final  . RDW 08/12/2013 13.9  11.5 -  15.5 % Final  . Platelets 08/12/2013 207  150 - 400 K/uL Final  . Sodium 08/12/2013 137  137 - 147 mEq/L Final  . Potassium 08/12/2013 4.1  3.7 - 5.3 mEq/L Final  . Chloride 08/12/2013 101  96 - 112 mEq/L Final  . CO2 08/12/2013 24  19 - 32 mEq/L Final  . Glucose, Bld 08/12/2013 162* 70 - 99 mg/dL Final  . BUN 08/12/2013 20  6 - 23 mg/dL Final  . Creatinine, Ser 08/12/2013 0.75  0.50 - 1.10 mg/dL Final  . Calcium 08/12/2013 8.4  8.4 - 10.5 mg/dL Final  . GFR calc non Af Amer 08/12/2013 >90  >90 mL/min Final  . GFR calc Af Amer 08/12/2013 >90  >90 mL/min Final   Comment: (NOTE)                          The eGFR has been calculated using the CKD EPI equation.                          This calculation has not been validated in all clinical situations.                          eGFR's persistently <90 mL/min signify possible Chronic Kidney                          Disease.  . Anion gap 08/12/2013 12  5 - 15 Final  . Prothrombin Time 08/13/2013 17.8* 11.6 - 15.2 seconds Final  . INR 08/13/2013 1.47  0.00 - 1.49 Final  . WBC 08/13/2013 9.6  4.0 - 10.5 K/uL Final  . RBC 08/13/2013 3.41* 3.87 - 5.11 MIL/uL Final  . Hemoglobin 08/13/2013 9.6* 12.0 - 15.0 g/dL Final  . HCT 08/13/2013 29.3* 36.0 - 46.0 % Final  . MCV 08/13/2013 85.9  78.0 - 100.0 fL Final  . MCH 08/13/2013 28.2  26.0 - 34.0 pg Final  . MCHC 08/13/2013 32.8  30.0 - 36.0 g/dL Final  . RDW 08/13/2013 14.0  11.5 - 15.5 % Final  . Platelets 08/13/2013 206  150 - 400 K/uL Final  . Prothrombin Time 08/14/2013 18.8* 11.6 - 15.2  seconds Final  . INR 08/14/2013 1.57* 0.00 - 1.49 Final  Hospital Outpatient Visit on 08/02/2013  Component Date Value Ref Range Status  . aPTT 08/02/2013 29  24 - 37 seconds Final  . Sodium 08/02/2013 141  137 - 147 mEq/L Final  . Potassium 08/02/2013 4.0  3.7 - 5.3 mEq/L Final  . Chloride 08/02/2013 103  96 - 112 mEq/L Final  . CO2 08/02/2013 27  19 - 32 mEq/L Final  . Glucose, Bld 08/02/2013 87  70 - 99  mg/dL Final  . BUN 08/02/2013 16  6 - 23 mg/dL Final  . Creatinine, Ser 08/02/2013 0.68  0.50 - 1.10 mg/dL Final  . Calcium 08/02/2013 9.4  8.4 - 10.5 mg/dL Final  . Total Protein 08/02/2013 7.8  6.0 - 8.3 g/dL Final  . Albumin 08/02/2013 3.8  3.5 - 5.2 g/dL Final  . AST 08/02/2013 25  0 - 37 U/L Final  . ALT 08/02/2013 25  0 - 35 U/L Final  . Alkaline Phosphatase 08/02/2013 83  39 - 117 U/L Final  . Total Bilirubin 08/02/2013 0.3  0.3 - 1.2 mg/dL Final  . GFR calc non Af Amer 08/02/2013 >90  >90 mL/min Final  . GFR calc Af Amer 08/02/2013 >90  >90 mL/min Final   Comment: (NOTE)                          The eGFR has been calculated using the CKD EPI equation.                          This calculation has not been validated in all clinical situations.                          eGFR's persistently <90 mL/min signify possible Chronic Kidney                          Disease.  Marland Kitchen Prothrombin Time 08/02/2013 13.8  11.6 - 15.2 seconds Final  . INR 08/02/2013 1.08  0.00 - 1.49 Final  . ABO/RH(D) 08/02/2013 O POS   Final  . Antibody Screen 08/02/2013 NEG   Final  . Sample Expiration 08/02/2013 08/13/2013   Final  . Color, Urine 08/02/2013 YELLOW  YELLOW Final  . APPearance 08/02/2013 CLEAR  CLEAR Final  . Specific Gravity, Urine 08/02/2013 1.026  1.005 - 1.030 Final  . pH 08/02/2013 5.5  5.0 - 8.0 Final  . Glucose, UA 08/02/2013 NEGATIVE  NEGATIVE mg/dL Final  . Hgb urine dipstick 08/02/2013 NEGATIVE  NEGATIVE Final  . Bilirubin Urine 08/02/2013 NEGATIVE  NEGATIVE Final  . Ketones, ur 08/02/2013 NEGATIVE  NEGATIVE mg/dL Final  . Protein, ur 08/02/2013 NEGATIVE  NEGATIVE mg/dL Final  . Urobilinogen, UA 08/02/2013 0.2  0.0 - 1.0 mg/dL Final  . Nitrite 08/02/2013 NEGATIVE  NEGATIVE Final  . Leukocytes, UA 08/02/2013 NEGATIVE  NEGATIVE Final   MICROSCOPIC NOT DONE ON URINES WITH NEGATIVE PROTEIN, BLOOD, LEUKOCYTES, NITRITE, OR GLUCOSE <1000 mg/dL.  . WBC 08/02/2013 5.1  4.0 - 10.5 K/uL Final  .  RBC 08/02/2013 4.72  3.87 - 5.11 MIL/uL Final  . Hemoglobin 08/02/2013 13.2  12.0 - 15.0 g/dL Final  . HCT 08/02/2013 40.4  36.0 - 46.0 % Final  . MCV 08/02/2013 85.6  78.0 - 100.0 fL Final  . MCH 08/02/2013 28.0  26.0 -  34.0 pg Final  . MCHC 08/02/2013 32.7  30.0 - 36.0 g/dL Final  . RDW 08/02/2013 13.5  11.5 - 15.5 % Final  . Platelets 08/02/2013 271  150 - 400 K/uL Final  . MRSA, PCR 08/02/2013 NEGATIVE  NEGATIVE Final  . Staphylococcus aureus 08/02/2013 NEGATIVE  NEGATIVE Final   Comment:                                 The Xpert SA Assay (FDA                          approved for NASAL specimens                          in patients over 62 years of age),                          is one component of                          a comprehensive surveillance                          program.  Test performance has                          been validated by [employer] for patients greater                          than or equal to 37 year old.                          It is not intended                          to diagnose infection nor to                          guide or monitor treatment.  . ABO/RH(D) 08/02/2013 O POS   Final     X-Rays:Dg Chest 2 View  08/02/2013   CLINICAL DATA:  53 year old female preoperative study for knee surgery. Initial encounter. History of DVT and breast cancer.  EXAM: CHEST  2 VIEW  COMPARISON:  02/23/2009.  FINDINGS: Left porta cath is been removed. Interval postoperative changes to the right axilla. Mildly lower lung volumes. Normal cardiac size and mediastinal contours. Visualized tracheal air column is within normal limits. No pneumothorax, pulmonary edema, pleural effusion or confluent pulmonary opacity. No acute osseous abnormality identified.  IMPRESSION: No acute cardiopulmonary abnormality.   Electronically Signed   By: Lars Pinks M.D.   On: 08/02/2013 15:27   Ct Angio Chest Pe W/cm &/or Wo Cm  08/12/2013   CLINICAL DATA:  tachy  and O2 desat s/p total knee; rule out PE, history breast cancer  EXAM: CT ANGIOGRAPHY CHEST WITH CONTRAST  TECHNIQUE: Multidetector CT imaging of the chest was performed using the standard protocol during bolus administration of intravenous contrast. Multiplanar CT image  reconstructions and MIPs were obtained to evaluate the vascular anatomy.  CONTRAST:  171m OMNIPAQUE IOHEXOL 350 MG/ML SOLN  COMPARISON:  Two-view chest 08/02/2013, and PET-CT 01/02/2009  FINDINGS: The thoracic inlet is unremarkable.  No mediastinal nor hilar masses or adenopathy. Diffuse wall thickening in the mid to lower portion of the esophagus. A small hiatal hernia is appreciated.  No filling defects within the main, lobar, or segmental pulmonary arteries. No thoracic aortic aneurysm nor dissection.  The lungs are clear. The central airways are patent. Postsurgical changes in the axillary tail region of the right breast.  Diffuse low attenuation throughout the liver. Remaining visualized upper abdominal viscera are unremarkable.  There no aggressive appearing osseous lesions. Modic endplate changes are appreciated within the spine.  Review of the MIP images confirms the above findings.  IMPRESSION: No CT evidence of pulmonary arterial embolic disease.  Diffuse thickening of the mid and lower esophageal wall. Dx may represent sequela of esophagitis. Clinical correlation recommended. Clinically warranted further evaluation with direct visualization recommended.  Hepatic steatosis  No further focal or acute thoracic abnormalities.   Electronically Signed   By: HMargaree MackintoshM.D.   On: 08/12/2013 12:41   Dg Knee Left Port  08/10/2013   CLINICAL DATA:  Postop LEFT total knee replacement  EXAM: PORTABLE LEFT KNEE - 1-2 VIEW  COMPARISON:  Portable exam 1525 hr without priors for compression  FINDINGS: Osseous demineralization.  Components of a LEFT knee prosthesis in expected positions.  Linear calcific density is identified adjacent to the  medial LEFT tibial metaphysis on the AP view.  This is of uncertain etiology.  Potentially this could represent a subtle tibial metaphyseal fracture, periosteal elevation such as from subperiosteal hemorrhage, prior periosteal reaction, less likely superimposed artifact.  This finding is not identified on the lateral view.  No additional potential fracture or dislocation.  No periprosthetic lucency.  Scattered soft tissue gas and soft tissue swelling consistent with prior surgery.  Anterior surgical drain.  IMPRESSION: Linear calcific density adjacent to the medial LEFT tibial metaphysis, uncertain etiology, question subtle tibial metaphyseal fracture, prior periosteal reaction, subperiosteal hemorrhage, or artifact.   Electronically Signed   By: MLavonia DanaM.D.   On: 08/10/2013 16:03    EKG: Orders placed during the hospital encounter of 08/10/13  . EKG 12-LEAD  . EKG 12-LEAD  . EKG 12-LEAD  . EKG 12-LEAD     Hospital Course: Wendy AFONSOis a 53y.o. [facility] were brought to the operating room on 08/10/2013 and underwent Procedure(s): LEFT TOTAL KNEE ARTHROPLASTY.  Patient tolerated the procedure well and was later transferred to the recovery room and then to the orthopaedic floor for postoperative care.  They were given PO and IV analgesics for pain control following their surgery.  They were given 24 hours of postoperative antibiotics of  Anti-infectives   Start     Dose/Rate Route Frequency Ordered Stop   08/10/13 1800  clindamycin (CLEOCIN) IVPB 600 mg     600 mg 100 mL/hr over 30 Minutes Intravenous Every 6 hours 08/10/13 1649 08/11/13 0104   08/10/13 1228  polymyxin B 500,000 Units, bacitracin 50,000 Units in sodium chloride irrigation 0.9 % 500 mL irrigation  Status:  Discontinued       As needed 08/10/13 1229 08/10/13 1500   08/10/13 1034  clindamycin (CLEOCIN) IVPB 900 mg     900 mg 100 mL/hr over 30 Minutes Intravenous On call to O.R. 08/10/13  1034 08/10/13 1257     and started on DVT prophylaxis in the form of Xarelto.   PT and OT were ordered for total joint protocol.  Discharge planning consulted to help with postop disposition and equipment needs.  Patient had a decent night on the evening of surgery.  They started to get up OOB with therapy on day one. She had issues with pain as well as SOB and tachycardia with therapy. Hemovac drain was pulled without difficulty.  Continued to work with therapy into day two. SOB and tachycardia continued. EKG showed sinus tachy and CT angio negative for PE. By day four, the patient had progressed with therapy and meeting their goals.  Incision was healing well.  Patient was seen in rounds and was ready to go home.   Diet: Regular diet Activity:WBAT Follow-up:in 2 weeks Disposition - Home Discharged Condition: stable   Discharge Instructions   Call MD / Call 911    Complete by:  As directed   If you experience chest pain or shortness of breath, CALL 911 and be transported to the hospital emergency room.  If you develope a fever above 101 F, pus (white drainage) or increased drainage or redness at the wound, or calf pain, call your surgeon's office.     Constipation Prevention    Complete by:  As directed   Drink plenty of fluids.  Prune juice may be helpful.  You may use a stool softener, such as Colace (over the counter) 100 mg twice a day.  Use MiraLax (over the counter) for constipation as needed.     Diet general    Complete by:  As directed      Discharge instructions    Complete by:  As directed   Walk with your walker. Weight bearing as tolerated [facility] will follow you at home for your therapy  Do not change your dressing unless there is excess drainage Shower only, no tub bath. Call if any temperatures greater than 101 or any wound complications: [phone] during the day and ask for Dr. Charlestine Night nurse, Brunilda Payor.     Do not put a pillow under the knee. Place it  under the heel.    Complete by:  As directed      Driving restrictions    Complete by:  As directed   No driving     Increase activity slowly as tolerated    Complete by:  As directed             Medication List    STOP taking these medications       ADVIL 200 MG tablet  Generic drug:  ibuprofen      TAKE these medications       ferrous sulfate 325 (65 FE) MG tablet  Take 1 tablet (325 mg total) by mouth 3 (three) times daily after meals.     HYDROmorphone 2 MG tablet  Commonly known as:  DILAUDID  Take 1-2 tablets (2-4 mg total) by mouth every 3 (three) hours as needed for severe pain (Q4-6 hours PRN).     methocarbamol 500 MG tablet  Commonly known as:  ROBAXIN  Take 1 tablet (500 mg total) by mouth every 6 (six) hours as needed for muscle spasms.     omeprazole 20 MG capsule  Commonly known as:  PRILOSEC  Take 20 mg by mouth daily.     rivaroxaban 10 MG Tabs tablet  Commonly known as:  XARELTO  Take 1  tablet (10 mg total) by mouth daily.           Follow-up Information   Follow up with GIOFFRE,RONALD A, MD. Schedule an appointment as soon as possible for a visit in 2 weeks.   Specialty:  Orthopedic Surgery   Contact information:   [address] [place] [address] [phone]       Follow up with [place] )    Contact information:   [place] [address] [phone]       Signed: Ardeen Jourdain, PA-C Orthopaedic Surgery 08/16/2013, 1:40 PM

## 2013-09-01 ENCOUNTER — Ambulatory Visit: Payer: Medicare Other | Attending: Orthopedic Surgery | Admitting: Physical Therapy

## 2013-09-01 DIAGNOSIS — IMO0001 Reserved for inherently not codable concepts without codable children: Secondary | ICD-10-CM | POA: Diagnosis not present

## 2013-09-01 DIAGNOSIS — M25669 Stiffness of unspecified knee, not elsewhere classified: Secondary | ICD-10-CM | POA: Insufficient documentation

## 2013-09-01 DIAGNOSIS — R5381 Other malaise: Secondary | ICD-10-CM | POA: Insufficient documentation

## 2013-09-01 DIAGNOSIS — M25569 Pain in unspecified knee: Secondary | ICD-10-CM | POA: Diagnosis not present

## 2013-09-01 DIAGNOSIS — Z9010 Acquired absence of unspecified breast and nipple: Secondary | ICD-10-CM | POA: Diagnosis not present

## 2013-09-01 DIAGNOSIS — Z96659 Presence of unspecified artificial knee joint: Secondary | ICD-10-CM | POA: Diagnosis not present

## 2013-09-01 DIAGNOSIS — R262 Difficulty in walking, not elsewhere classified: Secondary | ICD-10-CM | POA: Insufficient documentation

## 2013-09-03 ENCOUNTER — Ambulatory Visit: Payer: Medicare Other | Admitting: Physical Therapy

## 2013-09-03 DIAGNOSIS — IMO0001 Reserved for inherently not codable concepts without codable children: Secondary | ICD-10-CM | POA: Diagnosis not present

## 2013-09-06 ENCOUNTER — Ambulatory Visit: Payer: Medicare Other | Admitting: Physical Therapy

## 2013-09-06 DIAGNOSIS — IMO0001 Reserved for inherently not codable concepts without codable children: Secondary | ICD-10-CM | POA: Diagnosis not present

## 2013-09-08 ENCOUNTER — Ambulatory Visit: Payer: Medicare Other | Admitting: Physical Therapy

## 2013-09-08 DIAGNOSIS — IMO0001 Reserved for inherently not codable concepts without codable children: Secondary | ICD-10-CM | POA: Diagnosis not present

## 2013-09-09 ENCOUNTER — Ambulatory Visit: Payer: Medicare Other | Admitting: Physical Therapy

## 2013-09-09 DIAGNOSIS — IMO0001 Reserved for inherently not codable concepts without codable children: Secondary | ICD-10-CM | POA: Diagnosis not present

## 2013-09-13 ENCOUNTER — Encounter: Payer: Medicare Other | Admitting: Physical Therapy

## 2013-09-14 ENCOUNTER — Encounter: Payer: Medicare Other | Admitting: *Deleted

## 2013-09-14 ENCOUNTER — Ambulatory Visit: Payer: Medicare Other | Attending: Orthopedic Surgery | Admitting: Physical Therapy

## 2013-09-14 ENCOUNTER — Ambulatory Visit (HOSPITAL_COMMUNITY): Payer: Medicare Other

## 2013-09-14 DIAGNOSIS — M25669 Stiffness of unspecified knee, not elsewhere classified: Secondary | ICD-10-CM | POA: Diagnosis not present

## 2013-09-14 DIAGNOSIS — M25569 Pain in unspecified knee: Secondary | ICD-10-CM | POA: Insufficient documentation

## 2013-09-14 DIAGNOSIS — IMO0001 Reserved for inherently not codable concepts without codable children: Secondary | ICD-10-CM | POA: Diagnosis not present

## 2013-09-14 DIAGNOSIS — R5381 Other malaise: Secondary | ICD-10-CM | POA: Diagnosis not present

## 2013-09-14 DIAGNOSIS — R262 Difficulty in walking, not elsewhere classified: Secondary | ICD-10-CM | POA: Diagnosis not present

## 2013-09-14 DIAGNOSIS — Z96659 Presence of unspecified artificial knee joint: Secondary | ICD-10-CM | POA: Diagnosis not present

## 2013-09-14 DIAGNOSIS — Z9010 Acquired absence of unspecified breast and nipple: Secondary | ICD-10-CM | POA: Insufficient documentation

## 2013-09-15 ENCOUNTER — Ambulatory Visit: Payer: Medicare Other | Admitting: Physical Therapy

## 2013-09-15 DIAGNOSIS — IMO0001 Reserved for inherently not codable concepts without codable children: Secondary | ICD-10-CM | POA: Diagnosis not present

## 2013-09-15 NOTE — Progress Notes (Signed)
This encounter was created in error - please disregard.

## 2013-09-16 ENCOUNTER — Ambulatory Visit: Payer: Medicare Other | Admitting: Physical Therapy

## 2013-09-16 DIAGNOSIS — IMO0001 Reserved for inherently not codable concepts without codable children: Secondary | ICD-10-CM | POA: Diagnosis not present

## 2013-09-21 ENCOUNTER — Ambulatory Visit: Payer: Medicare Other | Admitting: Physical Therapy

## 2013-09-21 DIAGNOSIS — IMO0001 Reserved for inherently not codable concepts without codable children: Secondary | ICD-10-CM | POA: Diagnosis not present

## 2013-09-22 ENCOUNTER — Ambulatory Visit: Payer: Medicare Other | Admitting: Physical Therapy

## 2013-09-22 DIAGNOSIS — IMO0001 Reserved for inherently not codable concepts without codable children: Secondary | ICD-10-CM | POA: Diagnosis not present

## 2013-09-23 ENCOUNTER — Ambulatory Visit: Payer: Medicare Other | Admitting: Physical Therapy

## 2013-09-23 DIAGNOSIS — IMO0001 Reserved for inherently not codable concepts without codable children: Secondary | ICD-10-CM | POA: Diagnosis not present

## 2013-09-24 ENCOUNTER — Encounter (HOSPITAL_COMMUNITY): Payer: Medicare Other | Attending: Hematology and Oncology

## 2013-09-24 ENCOUNTER — Encounter (HOSPITAL_COMMUNITY): Payer: Self-pay

## 2013-09-24 ENCOUNTER — Encounter (HOSPITAL_COMMUNITY): Payer: Medicare Other

## 2013-09-24 VITALS — BP 128/80 | HR 100 | Temp 98.3°F | Resp 18 | Wt 203.0 lb

## 2013-09-24 DIAGNOSIS — R51 Headache: Secondary | ICD-10-CM | POA: Insufficient documentation

## 2013-09-24 DIAGNOSIS — Z9109 Other allergy status, other than to drugs and biological substances: Secondary | ICD-10-CM | POA: Insufficient documentation

## 2013-09-24 DIAGNOSIS — D638 Anemia in other chronic diseases classified elsewhere: Secondary | ICD-10-CM | POA: Insufficient documentation

## 2013-09-24 DIAGNOSIS — M129 Arthropathy, unspecified: Secondary | ICD-10-CM | POA: Insufficient documentation

## 2013-09-24 DIAGNOSIS — Z885 Allergy status to narcotic agent status: Secondary | ICD-10-CM | POA: Insufficient documentation

## 2013-09-24 DIAGNOSIS — C50911 Malignant neoplasm of unspecified site of right female breast: Secondary | ICD-10-CM

## 2013-09-24 DIAGNOSIS — M1710 Unilateral primary osteoarthritis, unspecified knee: Secondary | ICD-10-CM | POA: Diagnosis not present

## 2013-09-24 DIAGNOSIS — C50919 Malignant neoplasm of unspecified site of unspecified female breast: Secondary | ICD-10-CM

## 2013-09-24 DIAGNOSIS — Z881 Allergy status to other antibiotic agents status: Secondary | ICD-10-CM | POA: Diagnosis not present

## 2013-09-24 DIAGNOSIS — Z96659 Presence of unspecified artificial knee joint: Secondary | ICD-10-CM | POA: Insufficient documentation

## 2013-09-24 DIAGNOSIS — M159 Polyosteoarthritis, unspecified: Secondary | ICD-10-CM | POA: Insufficient documentation

## 2013-09-24 DIAGNOSIS — Z9010 Acquired absence of unspecified breast and nipple: Secondary | ICD-10-CM | POA: Diagnosis not present

## 2013-09-24 DIAGNOSIS — Z880 Allergy status to penicillin: Secondary | ICD-10-CM | POA: Insufficient documentation

## 2013-09-24 DIAGNOSIS — I498 Other specified cardiac arrhythmias: Secondary | ICD-10-CM | POA: Diagnosis not present

## 2013-09-24 DIAGNOSIS — I82409 Acute embolism and thrombosis of unspecified deep veins of unspecified lower extremity: Secondary | ICD-10-CM | POA: Diagnosis present

## 2013-09-24 DIAGNOSIS — I890 Lymphedema, not elsewhere classified: Secondary | ICD-10-CM | POA: Insufficient documentation

## 2013-09-24 DIAGNOSIS — M151 Heberden's nodes (with arthropathy): Secondary | ICD-10-CM

## 2013-09-24 DIAGNOSIS — J309 Allergic rhinitis, unspecified: Secondary | ICD-10-CM | POA: Diagnosis not present

## 2013-09-24 DIAGNOSIS — K219 Gastro-esophageal reflux disease without esophagitis: Secondary | ICD-10-CM | POA: Insufficient documentation

## 2013-09-24 LAB — CBC WITH DIFFERENTIAL/PLATELET
BASOS ABS: 0.1 10*3/uL (ref 0.0–0.1)
Basophils Relative: 1 % (ref 0–1)
Eosinophils Absolute: 0.2 10*3/uL (ref 0.0–0.7)
Eosinophils Relative: 5 % (ref 0–5)
HCT: 37 % (ref 36.0–46.0)
Hemoglobin: 11.4 g/dL — ABNORMAL LOW (ref 12.0–15.0)
Lymphocytes Relative: 39 % (ref 12–46)
Lymphs Abs: 1.6 10*3/uL (ref 0.7–4.0)
MCH: 25.9 pg — ABNORMAL LOW (ref 26.0–34.0)
MCHC: 30.8 g/dL (ref 30.0–36.0)
MCV: 84.1 fL (ref 78.0–100.0)
MONO ABS: 0.3 10*3/uL (ref 0.1–1.0)
Monocytes Relative: 7 % (ref 3–12)
NEUTROS ABS: 2 10*3/uL (ref 1.7–7.7)
NEUTROS PCT: 48 % (ref 43–77)
Platelets: 339 10*3/uL (ref 150–400)
RBC: 4.4 MIL/uL (ref 3.87–5.11)
RDW: 14 % (ref 11.5–15.5)
WBC: 4.2 10*3/uL (ref 4.0–10.5)

## 2013-09-24 LAB — COMPREHENSIVE METABOLIC PANEL
ALBUMIN: 3.6 g/dL (ref 3.5–5.2)
ALT: 15 U/L (ref 0–35)
AST: 24 U/L (ref 0–37)
Alkaline Phosphatase: 110 U/L (ref 39–117)
Anion gap: 14 (ref 5–15)
BILIRUBIN TOTAL: 0.3 mg/dL (ref 0.3–1.2)
BUN: 14 mg/dL (ref 6–23)
CO2: 26 mEq/L (ref 19–32)
CREATININE: 0.74 mg/dL (ref 0.50–1.10)
Calcium: 9.3 mg/dL (ref 8.4–10.5)
Chloride: 101 mEq/L (ref 96–112)
GFR calc Af Amer: >90 mL/min (ref >90)
GFR calc non Af Amer: >90 mL/min (ref >90)
Glucose, Bld: 144 mg/dL — ABNORMAL HIGH (ref 70–99)
POTASSIUM: 4 meq/L (ref 3.7–5.3)
Sodium: 141 mEq/L (ref 137–147)
Total Protein: 8.1 g/dL (ref 6.0–8.3)

## 2013-09-24 NOTE — Patient Instructions (Signed)
[facility] Discharge Instructions  RECOMMENDATIONS MADE BY THE CONSULTANT AND ANY TEST RESULTS WILL BE SENT TO YOUR REFERRING PHYSICIAN.  EXAM FINDINGS BY THE PHYSICIAN TODAY AND SIGNS OR SYMPTOMS TO REPORT TO CLINIC OR PRIMARY PHYSICIAN: Exam and findings as discussed by Barnet Glasgow.  Try wrapping your right arm (starting at wrist) with an Ace bandage to help control swelling.  Please contact us if the swelling does not improve or worsens, and will will refer you appropriately for further treatment.  Return for office visit and lab work in 6 months.  Thank you for choosing [facility] to provide your oncology and hematology care.  To afford each patient quality time with our providers, please arrive at least 15 minutes before your scheduled appointment time.  With your help, our goal is to use those 15 minutes to complete the necessary work-up to ensure our physicians have the information they need to help with your evaluation and healthcare recommendations.    Effective January 1st, 2014, we ask that you re-schedule your appointment with our physicians should you arrive 10 or more minutes late for your appointment.  We strive to give you quality time with our providers, and arriving late affects you and other patients whose appointments are after yours.    Again, thank you for choosing [facility].  Our hope is that these requests will decrease the amount of time that you wait before being seen by our physicians.       _____________________________________________________________  Should you have questions after your visit to [facility], please contact our office at [phone] between the hours of 8:30 a.m. and 4:30 p.m.  Voicemails left after 4:30 p.m. will not be returned until the following business day.  For prescription refill requests, have your pharmacy contact our office with your prescription refill request.     _______________________________________________________________  We hope that we have given you very good care.  You may receive a patient satisfaction survey in the mail, please complete it and return it as soon as possible.  We value your feedback!  _______________________________________________________________  Have you asked about our STAR program?  STAR stands for Survivorship Training and Rehabilitation, and this is a nationally recognized cancer care program that focuses on survivorship and rehabilitation.  Cancer and cancer treatments may cause problems, such as, pain, making you feel tired and keeping you from doing the things that you need or want to do. Cancer rehabilitation can help. Our goal is to reduce these troubling effects and help you have the best quality of life possible.  You may receive a survey from a nurse that asks questions about your current state of health.  Based on the survey results, all eligible patients will be referred to the [facility] program for an evaluation so we can better serve you!  A frequently asked questions sheet is available upon request.

## 2013-09-24 NOTE — Progress Notes (Signed)
[facility]  OFFICE PROGRESS NOTE  [name], DO [place] [address]  DIAGNOSIS: Inflammatory carcinoma of breast, right  DVT (deep venous thrombosis), unspecified laterality  [place] nodes  Lymphedema of upper extremity  Chief Complaint  Patient presents with  . Inflammatory breast cancer right    CURRENT THERAPY: Watchful expectation and surveillance.  INTERVAL HISTORY: Wendy Gilbert 53 y.o. female returns for followup of inflammatory cancer of the right breast, status post bilateral mastectomy, chemotherapy, and radiation therapy to the right chest wall. Her original diagnosis was in June of 2010. Radiation therapy was completed in March of 2011. She underwent left knee replacement in June of 2015 and just finished rehabilitation. She has noticed increased swelling of the right upper extremity for the last 2 weeks probably associated with the apparatus used to rehabilitation for left knee. Appetite is good with no cough, wheezing, with achiness of both thighs without peripheral paresthesias, bone pain, fever, night sweats, vaginal bleeding, melena, hematochezia, hematuria, skin rash, headache, or seizures.    MEDICAL HISTORY: Past Medical History  Diagnosis Date  . DVT (deep venous thrombosis) 10/2008    left arm  . GERD (gastroesophageal reflux disease)   . Sinusitis   . PONV (postoperative nausea and vomiting)   . Headache(784.0)     sinus  . Arthritis     "all over"  . Seasonal allergies   . Inflammatory carcinoma of right breast dx'd 07/2008    INTERIM HISTORY: has Inflammatory carcinoma of right breast; DVT (deep venous thrombosis); Osteoarthritis of left knee; Morbid obesity; Total knee replacement status; and Sinus tachycardia on her problem list.   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 The mass was 12 x 11.5 cm with diffuse peau d'orange of changes in the right breast and the right breast was also 2-1/2 times the size of the left breast. She received dose dense FEC for 5 cycles then switched to carboplatin and Taxotere 4 cycles. She then had bilateral mastectomies showing no evidence of disease in the breast and 14 lymph nodes were negative. She was treated with radiation therapy by Dr. Ledon Snare in March of 2011. She presented here for the first time on 08/06/2008.   ALLERGIES:  is allergic to demerol; erythromycin; adhesive; cephalexin; and penicillins.  MEDICATIONS: has a current medication list which includes the following prescription(s): hydromorphone, methocarbamol, and omeprazole.  SURGICAL HISTORY:  Past Surgical History  Procedure Laterality Date  . Knee arthroscopy Left 11/1996  . Cesarean section  09/1999  . Dilation and curettage of uterus  1978  . Portacath placement  08/09/2008  . Incision and drainage abscess  10/25/2008    and debridement left axillary abscess, abd. wall abscess, left thigh abscess  . Port-a-cath removal Left 05/07/2012    Procedure: REMOVAL PORT-A-CATH;  Surgeon: Odis Hollingshead, MD;  Location: [facility];  Service: General;  Laterality: Left;  . Tubal ligation    . Mastectomy Bilateral 02/23/2009    right modified radical, left simple  . Mastectomy  2011     bilateral   . Total knee arthroplasty Left 08/10/2013    Procedure: LEFT TOTAL KNEE ARTHROPLASTY;  Surgeon: Tobi Bastos, MD;  Location: [facility];  Service: Orthopedics;  Laterality: Left;    FAMILY  HISTORY: family history includes Breast cancer in her mother; Cancer in her mother and paternal uncle; Heart disease in her father.  SOCIAL HISTORY:  reports that she has never smoked. She has never used smokeless tobacco. She reports that she does not drink alcohol or use illicit drugs.  REVIEW OF  SYSTEMS:  Other than that discussed above is noncontributory.  PHYSICAL EXAMINATION: ECOG PERFORMANCE STATUS: 1 - Symptomatic but completely ambulatory  Blood pressure 128/80, pulse 100, temperature 98.3 F (36.8 C), temperature source Oral, resp. rate 18, weight 203 lb (92.08 kg).  GENERAL:alert, no distress and comfortable. Moderately obese. SKIN: skin color, texture, turgor are normal, no rashes or significant lesions EYES: PERLA; Conjunctiva are pink and non-injected, sclera clear SINUSES: No redness or tenderness over maxillary or ethmoid sinuses OROPHARYNX:no exudate, no erythema on lips, buccal mucosa, or tongue. NECK: supple, thyroid normal size, non-tender, without nodularity. No masses CHEST: Status post bilateral mastectomy with no subcutaneous nodules. LYMPH:  no palpable lymphadenopathy in the cervical, axillary or inguinal LUNGS: clear to auscultation and percussion with normal breathing effort HEART: regular rate & rhythm and no murmurs. ABDOMEN:abdomen soft, non-tender and normal bowel sounds MUSCULOSKELETAL:no cyanosis of digits and no clubbing. Range of motion normal. Surgical wound left knee well-healed. +1 right upper extremity lymphedema. NEURO: alert & oriented x 3 with fluent speech, no focal motor/sensory deficits   LABORATORY DATA: Appointment on 09/24/2013  Component Date Value Ref Range Status  . WBC 09/24/2013 4.2  4.0 - 10.5 K/uL Final  . RBC 09/24/2013 4.40  3.87 - 5.11 MIL/uL Final  . Hemoglobin 09/24/2013 11.4* 12.0 - 15.0 g/dL Final  . HCT 09/24/2013 37.0  36.0 - 46.0 % Final  . MCV 09/24/2013 84.1  78.0 - 100.0 fL Final  . MCH 09/24/2013 25.9* 26.0 - 34.0 pg Final  . MCHC 09/24/2013 30.8  30.0 - 36.0 g/dL Final  . RDW 09/24/2013 14.0  11.5 - 15.5 % Final  . Platelets 09/24/2013 339  150 - 400 K/uL Final  . Neutrophils Relative % 09/24/2013 48  43 - 77 % Final  . Neutro Abs 09/24/2013 2.0  1.7 - 7.7 K/uL Final  . Lymphocytes Relative 09/24/2013 39   12 - 46 % Final  . Lymphs Abs 09/24/2013 1.6  0.7 - 4.0 K/uL Final  . Monocytes Relative 09/24/2013 7  3 - 12 % Final  . Monocytes Absolute 09/24/2013 0.3  0.1 - 1.0 K/uL Final  . Eosinophils Relative 09/24/2013 5  0 - 5 % Final  . Eosinophils Absolute 09/24/2013 0.2  0.0 - 0.7 K/uL Final  . Basophils Relative 09/24/2013 1  0 - 1 % Final  . Basophils Absolute 09/24/2013 0.1  0.0 - 0.1 K/uL Final  . Sodium 09/24/2013 141  137 - 147 mEq/L Final  . Potassium 09/24/2013 4.0  3.7 - 5.3 mEq/L Final  . Chloride 09/24/2013 101  96 - 112 mEq/L Final  . CO2 09/24/2013 26  19 - 32 mEq/L Final  . Glucose, Bld 09/24/2013 144* 70 - 99 mg/dL Final  . BUN 09/24/2013 14  6 - 23 mg/dL Final  . Creatinine, Ser 09/24/2013 0.74  0.50 - 1.10 mg/dL Final  . Calcium 09/24/2013 9.3  8.4 - 10.5 mg/dL Final  . Total Protein 09/24/2013 8.1  6.0 - 8.3 g/dL Final  . Albumin 09/24/2013 3.6  3.5 - 5.2 g/dL Final  . AST 09/24/2013 24  0 - 37 U/L Final  . ALT 09/24/2013 15  0 - 35 U/L  Final  . Alkaline Phosphatase 09/24/2013 110  39 - 117 U/L Final  . Total Bilirubin 09/24/2013 0.3  0.3 - 1.2 mg/dL Final  . GFR calc non Af Amer 09/24/2013 >90  >90 mL/min Final  . GFR calc Af Amer 09/24/2013 >90  >90 mL/min Final   Comment: (NOTE)                          The eGFR has been calculated using the CKD EPI equation.                          This calculation has not been validated in all clinical situations.                          eGFR's persistently <90 mL/min signify possible Chronic Kidney                          Disease.  . Anion gap 09/24/2013 14  5 - 15 Final    PATHOLOGY: No new pathology.  Urinalysis    Component Value Date/Time   COLORURINE YELLOW 08/02/2013 [place] 08/02/2013 1353   LABSPEC 1.026 08/02/2013 1353   PHURINE 5.5 08/02/2013 1353   GLUCOSEU NEGATIVE 08/02/2013 1353   HGBUR NEGATIVE 08/02/2013 1353   BILIRUBINUR NEGATIVE 08/02/2013 1353   KETONESUR NEGATIVE 08/02/2013 1353    PROTEINUR NEGATIVE 08/02/2013 1353   UROBILINOGEN 0.2 08/02/2013 1353   NITRITE NEGATIVE 08/02/2013 1353   LEUKOCYTESUR NEGATIVE 08/02/2013 1353    RADIOGRAPHIC STUDIES: No results found.  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 The mass was 12 x 11.5 cm with diffuse peau d'orange of changes in the right breast and the right breast was also 2-1/2 times the size of the left breast. She received dose dense FEC for 5 cycles then switched to carboplatin and Taxotere 4 cycles. She then had bilateral mastectomies showing no evidence of disease in the breast and 14 lymph nodes were negative. She was treated with radiation therapy by Dr. Ledon Snare in March of 2011. She presented here for the first time on 08/06/2008. #2. Status post left knee replacement, June 2015, finish with rehabilitation 09/23/2013 #3. Right upper extremity lymphedema. #4. Anemia of chronic disease and acute blood loss secondary to left knee reconstruction.  PLAN:  #1. Ace bandage 6 inch to right upper extremity from wrist to axilla daily, remove at bedtime, and call back in swallowing is not improved after 2 weeks. #2. Followup 6 months with CBC, chem profile, CEA, CA 27-29, vitamin D level.   All questions were answered. The patient knows to call the clinic with any problems, questions or concerns. We can certainly see the patient much sooner if necessary.   I spent 25 minutes counseling the patient face to face. The total time spent in the appointment was 30 minutes.    Doroteo Bradford, MD 09/24/2013 11:56 AM  DISCLAIMER:  This note was dictated with voice recognition software.  Similar sounding words can inadvertently be transcribed inaccurately and may not be corrected upon review.

## 2013-09-25 LAB — CANCER ANTIGEN 27.29: CA 27.29: 27 U/mL (ref 0–39)

## 2013-09-25 LAB — CEA

## 2013-09-29 ENCOUNTER — Telehealth (HOSPITAL_COMMUNITY): Payer: Self-pay | Admitting: Hematology and Oncology

## 2013-09-29 ENCOUNTER — Other Ambulatory Visit (HOSPITAL_COMMUNITY): Payer: Self-pay | Admitting: Hematology and Oncology

## 2013-09-29 DIAGNOSIS — I890 Lymphedema, not elsewhere classified: Secondary | ICD-10-CM

## 2013-10-05 ENCOUNTER — Ambulatory Visit (HOSPITAL_COMMUNITY)
Admission: RE | Admit: 2013-10-05 | Discharge: 2013-10-05 | Disposition: A | Discharge status: Home / Self Care | Payer: Medicare Other | Source: Ambulatory Visit | Attending: Hematology and Oncology | Admitting: Hematology and Oncology

## 2013-10-05 DIAGNOSIS — I890 Lymphedema, not elsewhere classified: Secondary | ICD-10-CM | POA: Diagnosis present

## 2013-10-05 DIAGNOSIS — IMO0001 Reserved for inherently not codable concepts without codable children: Secondary | ICD-10-CM | POA: Insufficient documentation

## 2013-10-05 NOTE — Evaluation (Signed)
Physical Therapy Evaluation  Patient Details  Name: KENITA BINES MRN: [id number] Date of Birth: 1960/03/04  Today's Date: 10/05/2013 Time: 0930-1015 PT Time Calculation (min): 45 min Charge evaluation             Visit#: 1 of 1     Authorization: medicare    Past Medical History:  Past Medical History  Diagnosis Date  . DVT (deep venous thrombosis) 10/2008    left arm  . GERD (gastroesophageal reflux disease)   . Sinusitis   . PONV (postoperative nausea and vomiting)   . Headache(784.0)     sinus  . Arthritis     "all over"  . Seasonal allergies   . Inflammatory carcinoma of right breast dx'd 07/2008   Past Surgical History:  Past Surgical History  Procedure Laterality Date  . Knee arthroscopy Left 11/1996  . Cesarean section  09/1999  . Dilation and curettage of uterus  1978  . Portacath placement  08/09/2008  . Incision and drainage abscess  10/25/2008    and debridement left axillary abscess, abd. wall abscess, left thigh abscess  . Port-a-cath removal Left 05/07/2012    Procedure: REMOVAL PORT-A-CATH;  Surgeon: Odis Hollingshead, MD;  Location: [facility];  Service: General;  Laterality: Left;  . Tubal ligation    . Mastectomy Bilateral 02/23/2009    right modified radical, left simple  . Mastectomy  2011     bilateral   . Total knee arthroplasty Left 08/10/2013    Procedure: LEFT TOTAL KNEE ARTHROPLASTY;  Surgeon: Tobi Bastos, MD;  Location: [facility];  Service: Orthopedics;  Laterality: Left;    Subjective Symptoms/Limitations Symptoms: Ms. Bhatt states that she had a Lt TKR in June.  She noticed increased swelling of her Rt arm noticing increased pitting.  She denies pain.   Pertinent History: Makenlee Mckeag Blasdell 53 y.o. female returns for followup of inflammatory cancer of the right breast, status post bilateral mastectomy, chemotherapy, and radiation therapy to the right chest wall. Her original diagnosis was in June of 2010. Radiation therapy was completed  in March of 2011.  She has noticed incrased swelling of her RT t arm for about 3 weeks.  Pain Assessment Currently in Pain?: No/denies    Assessment     Date 10/05/2013 10/05/2013   Left Right  MCP 17.90 19.00  wrist 20.2 18.2  4cm 22.50 20.40  8cm 22.80 22.80  12 cm 25.50 25.80  16cm 27.50 28.30  20cm 28.50 30.00  24cm 28.50 30.10  28cm 32.20 33.00  32cm 35.10 34.60  36cm 36.70 36.50  40cm 37.50 38.30  44cm 35.20 38.60  48cm    52cm    56cm    60cm    64cm        Sum of squares 11046.57 11476.04  Total Volume 3516.234 3704.8889         Physical Therapy Assessment and Plan PT Assessment and Plan Clinical Impression Statement: Pt is a 53 yo female who has had remote hx of breast cancer.  Ms Kareem has noticed for the past three weeks that she has increased swelling in her Rt. UE that will not resolve.  She was referred to therapy.  Exam demonstrates a less than 200 cc difference in volume indicating that Ms. Prew will do well with a compression sleeve but does not need manual therapy at this time.    Rehab Potential: Good PT Plan: Discharge to home.  Pt was given information on  lymphedema and skin care.  A prescription for a compression sleeve was sent to MD and will be sent to pt home once the prescription is returned with the MD signature.     Goals PT Short Term Goals PT Short Term Goal 1: Pt to verbalize understanding on the importance of wearing compression sleeve daily PT Short Term Goal 2: Pt to verbalize understanding that the lymph system does not heal itself   Problem List Patient Active Problem List   Diagnosis Date Noted  . Lymphedema of upper extremity 10/05/2013  . Sinus tachycardia 08/13/2013  . Osteoarthritis of left knee 08/10/2013  . Morbid obesity 08/10/2013  . Total knee replacement status 08/10/2013  . DVT (deep venous thrombosis) 09/14/2010  . Inflammatory carcinoma of right breast 08/01/2010    PT Plan of Care PT Home Exercise Plan:  instructional information given   GP Functional Limitation: Other PT primary Other PT Primary Current Status (I9518): At least 1 percent but less than 20 percent impaired, limited or restricted Other PT Primary Goal Status (A4166): At least 1 percent but less than 20 percent impaired, limited or restricted Other PT Primary Discharge Status [phone]): At least 1 percent but less than 20 percent impaired, limited or restricted  RUSSELL,CINDY 10/05/2013, 2:10 PM  Physician Documentation Your signature is required to indicate approval of the treatment plan as stated above.  Please sign and either send electronically or make a copy of this report for your files and return this physician signed original.   Please mark one 1.__approve of plan  2. ___approve of plan with the following conditions.   ______________________________                                                          _____________________ Physician Signature                                                                                                             Date

## 2013-10-12 ENCOUNTER — Telehealth (HOSPITAL_COMMUNITY): Payer: Self-pay

## 2014-03-29 ENCOUNTER — Other Ambulatory Visit (HOSPITAL_COMMUNITY): Payer: Medicare Other

## 2014-03-29 ENCOUNTER — Ambulatory Visit (HOSPITAL_COMMUNITY): Payer: Medicare Other | Admitting: Hematology & Oncology

## 2014-04-11 ENCOUNTER — Other Ambulatory Visit (HOSPITAL_COMMUNITY): Payer: Self-pay

## 2014-04-11 DIAGNOSIS — C50919 Malignant neoplasm of unspecified site of unspecified female breast: Secondary | ICD-10-CM

## 2014-04-12 ENCOUNTER — Other Ambulatory Visit: Payer: Self-pay

## 2014-04-12 ENCOUNTER — Encounter (HOSPITAL_COMMUNITY): Payer: Self-pay | Admitting: Hematology & Oncology

## 2014-04-12 ENCOUNTER — Encounter (HOSPITAL_COMMUNITY): Payer: Medicare Other | Attending: Hematology & Oncology | Admitting: Hematology & Oncology

## 2014-04-12 ENCOUNTER — Encounter (HOSPITAL_BASED_OUTPATIENT_CLINIC_OR_DEPARTMENT_OTHER): Payer: Medicare Other

## 2014-04-12 VITALS — BP 129/77 | HR 87 | Temp 97.7°F | Resp 18 | Wt 202.2 lb

## 2014-04-12 DIAGNOSIS — Z853 Personal history of malignant neoplasm of breast: Secondary | ICD-10-CM

## 2014-04-12 DIAGNOSIS — C50919 Malignant neoplasm of unspecified site of unspecified female breast: Secondary | ICD-10-CM

## 2014-04-12 DIAGNOSIS — R21 Rash and other nonspecific skin eruption: Secondary | ICD-10-CM

## 2014-04-12 DIAGNOSIS — Q248 Other specified congenital malformations of heart: Secondary | ICD-10-CM

## 2014-04-12 DIAGNOSIS — C50911 Malignant neoplasm of unspecified site of right female breast: Secondary | ICD-10-CM

## 2014-04-12 DIAGNOSIS — I491 Atrial premature depolarization: Secondary | ICD-10-CM

## 2014-04-12 LAB — COMPREHENSIVE METABOLIC PANEL
ALT: 21 U/L (ref 0–35)
AST: 23 U/L (ref 0–37)
Albumin: 3.7 g/dL (ref 3.5–5.2)
Alkaline Phosphatase: 79 U/L (ref 39–117)
Anion gap: 3 — ABNORMAL LOW (ref 5–15)
BILIRUBIN TOTAL: 0.5 mg/dL (ref 0.3–1.2)
BUN: 16 mg/dL (ref 6–23)
CALCIUM: 8.9 mg/dL (ref 8.4–10.5)
CHLORIDE: 105 mmol/L (ref 96–112)
CO2: 27 mmol/L (ref 19–32)
CREATININE: 0.8 mg/dL (ref 0.50–1.10)
GFR calc Af Amer: >90 mL/min (ref >90)
GFR, EST NON AFRICAN AMERICAN: 81 mL/min — AB (ref >90)
GLUCOSE: 125 mg/dL — AB (ref 70–99)
Potassium: 3.9 mmol/L (ref 3.5–5.1)
Sodium: 135 mmol/L (ref 135–145)
Total Protein: 7.6 g/dL (ref 6.0–8.3)

## 2014-04-12 LAB — CBC WITH DIFFERENTIAL/PLATELET
Basophils Absolute: 0 10*3/uL (ref 0.0–0.1)
Basophils Relative: 1 % (ref 0–1)
EOS PCT: 4 % (ref 0–5)
Eosinophils Absolute: 0.2 10*3/uL (ref 0.0–0.7)
HEMATOCRIT: 37.9 % (ref 36.0–46.0)
Hemoglobin: 11.9 g/dL — ABNORMAL LOW (ref 12.0–15.0)
LYMPHS ABS: 1.9 10*3/uL (ref 0.7–4.0)
Lymphocytes Relative: 36 % (ref 12–46)
MCH: 25.9 pg — ABNORMAL LOW (ref 26.0–34.0)
MCHC: 31.4 g/dL (ref 30.0–36.0)
MCV: 82.6 fL (ref 78.0–100.0)
MONO ABS: 0.4 10*3/uL (ref 0.1–1.0)
Monocytes Relative: 8 % (ref 3–12)
Neutro Abs: 2.8 10*3/uL (ref 1.7–7.7)
Neutrophils Relative %: 53 % (ref 43–77)
Platelets: 294 10*3/uL (ref 150–400)
RBC: 4.59 MIL/uL (ref 3.87–5.11)
RDW: 14.6 % (ref 11.5–15.5)
WBC: 5.2 10*3/uL (ref 4.0–10.5)

## 2014-04-12 MED ORDER — TRIAMCINOLONE ACETONIDE 0.5 % EX OINT: 1.0000 "application " | TOPICAL_OINTMENT | Freq: Two times a day (BID) | CUTANEOUS | Status: DC

## 2014-04-12 MED ORDER — POTASSIUM CHLORIDE CRYS ER 20 MEQ PO TBCR: EXTENDED_RELEASE_TABLET | ORAL | Status: DC

## 2014-04-12 NOTE — Patient Instructions (Signed)
[place] at [facility] Discharge Instructions  RECOMMENDATIONS MADE BY THE CONSULTANT AND ANY TEST RESULTS WILL BE SENT TO YOUR REFERRING PHYSICIAN.  Exam and discussion by Dr. Whitney Muse. Need to get screening colonoscopy and pelvic and pap smear done.  We will make the referrals. EKG today Potassium - take as directed Will bring you back in 3 weeks to recheck your potassium. Triamcinolone - apply as directed  Report any new lumps, bone pain, shortness of breath or other symptoms. Labs in 3 and 6 months Office visit in 6 months.   Thank you for choosing [place] at [facility] to provide your oncology and hematology care.  To afford each patient quality time with our provider, please arrive at least 15 minutes before your scheduled appointment time.    You need to re-schedule your appointment should you arrive 10 or more minutes late.  We strive to give you quality time with our providers, and arriving late affects you and other patients whose appointments are after yours.  Also, if you no show three or more times for appointments you may be dismissed from the clinic at the providers discretion.     Again, thank you for choosing [facility].  Our hope is that these requests will decrease the amount of time that you wait before being seen by our physicians.       _____________________________________________________________  Should you have questions after your visit to [facility], please contact our office at [phone] between the hours of 8:30 a.m. and 4:30 p.m.  Voicemails left after 4:30 p.m. will not be returned until the following business day.  For prescription refill requests, have your pharmacy contact our office.

## 2014-04-12 NOTE — Progress Notes (Signed)
LABS FOR CBCD,CMP,[id number],VD25

## 2014-04-12 NOTE — Progress Notes (Signed)
Wendy Gilbert, [place] [address]    DIAGNOSIS: Inflammatory carcinoma of right breast   Staging form: Breast, AJCC 7th Edition     Clinical: Stage IIIC (T4d, N3c, cM0) - Signed by Baird Cancer, PA on 08/01/2010   CURRENT THERAPY: Observation  INTERVAL HISTORY: Wendy Gilbert 54 y.o. female returns for follow-up of a history of stage IIIC inflammatory carcinoma of the right breast. Disease was triple negative with positive supraclavicular and infraclavicular lymph nodes. She also had clinically positive axillary nodes. The mass was 12 x 11.5 cm with diffuse peau d'orange changes of the right breast.  Available records note her right breast was markedly enlarged. She received dose dense FEC for 5 cycles and then was switched to carboplatin and Taxotere 4 cycles. She underwent bilateral mastectomies which were negative for disease. She completed adjuvant radiation with Dr. Tammi Klippel in March 2011. She has a history of DVT of the left upper extremity and completed anticoagulation, removal of her Port-A-Cath, and has had no recurrence.  She complains of arthritis and notices "bumps" at the joints of her fingers has also had problems with a rash on her elbows and in her antecubital areas.  She has not had a colonoscopy nor a Pap smear.  MEDICAL HISTORY: Past Medical History  Diagnosis Date  . DVT (deep venous thrombosis) 10/2008    left arm  . GERD (gastroesophageal reflux disease)   . Sinusitis   . PONV (postoperative nausea and vomiting)   . Headache(784.0)     sinus  . Arthritis     "all over"  . Seasonal allergies   . Inflammatory carcinoma of right breast dx'd 07/2008    has Inflammatory carcinoma of right breast; DVT (deep venous thrombosis); Osteoarthritis of left knee; Morbid obesity; Total knee replacement status; Sinus tachycardia; and Lymphedema of upper extremity on her problem list.     is allergic to demerol; erythromycin; adhesive;  cephalexin; and penicillins.  [name] had no medications administered during this visit.  SURGICAL HISTORY: Past Surgical History  Procedure Laterality Date  . Knee arthroscopy Left 11/1996  . Cesarean section  09/1999  . Dilation and curettage of uterus  1978  . Portacath placement  08/09/2008  . Incision and drainage abscess  10/25/2008    and debridement left axillary abscess, abd. wall abscess, left thigh abscess  . Port-a-cath removal Left 05/07/2012    Procedure: REMOVAL PORT-A-CATH;  Surgeon: Odis Hollingshead, MD;  Location: [facility];  Service: General;  Laterality: Left;  . Tubal ligation    . Mastectomy Bilateral 02/23/2009    right modified radical, left simple  . Mastectomy  2011     bilateral   . Total knee arthroplasty Left 08/10/2013    Procedure: LEFT TOTAL KNEE ARTHROPLASTY;  Surgeon: Tobi Bastos, MD;  Location: [facility];  Service: Orthopedics;  Laterality: Left;    SOCIAL HISTORY: History   Social History  . Marital Status: Married    Spouse Name: N/A  . Number of Children: N/A  . Years of Education: N/A   Occupational History  . Not on file.   Social History Main Topics  . Smoking status: Never Smoker   . Smokeless tobacco: Never Used  . Alcohol Use: No  . Drug Use: No  . Sexual Activity: Not on file   Other Topics Concern  . Not on file   Social History Narrative    FAMILY HISTORY: Family  History  Problem Relation Age of Onset  . Breast cancer Mother   . Cancer Mother     breast, colon, ovarian  . Heart disease Father   . Cancer Paternal Uncle     pancreatic    Review of Systems  Constitutional: Negative.   HENT: Negative.   Eyes: Negative.   Respiratory: Negative.   Cardiovascular: Negative.   Gastrointestinal: Negative.   Genitourinary: Negative.   Musculoskeletal: Positive for joint pain.       Joint pain  Skin: Positive for rash.  Neurological: Negative.   Endo/Heme/Allergies: Negative.     Psychiatric/Behavioral: Negative.     PHYSICAL EXAMINATION  ECOG PERFORMANCE STATUS: 0 - Asymptomatic  Filed Vitals:   04/12/14 1051  BP: 129/77  Pulse: 87  Temp: 97.7 F (36.5 C)  Resp: 18    Physical Exam  Constitutional: She is oriented to person, place, and time and well-developed, well-nourished, and in no distress.  HENT:  Head: Normocephalic and atraumatic.  Nose: Nose normal.  Mouth/Throat: Oropharynx is clear and moist. No oropharyngeal exudate.  Eyes: Conjunctivae and EOM are normal. Pupils are equal, round, and reactive to light. Right eye exhibits no discharge. Left eye exhibits no discharge. No scleral icterus.  Neck: Normal range of motion. Neck supple. No tracheal deviation present. No thyromegaly present.  Cardiovascular: Normal rate, regular rhythm and normal heart sounds.  Exam reveals no gallop and no friction rub.   No murmur heard. Ectopy  Pulmonary/Chest: Effort normal and breath sounds normal. She has no wheezes. She has no rales.    Abdominal: Soft. Bowel sounds are normal. She exhibits no distension and no mass. There is no tenderness. There is no rebound and no guarding.  Musculoskeletal: Normal range of motion. She exhibits no edema.  Lymphadenopathy:    She has no cervical adenopathy.  Neurological: She is alert and oriented to person, place, and time. She has normal reflexes. No cranial nerve deficit. Gait normal. Coordination normal.  Skin: Skin is warm and dry. Rash noted.     Antecubital spaces are examined and show mild erythematous raised rash  Psychiatric: Mood, memory, affect and judgment normal.  Nursing note and vitals reviewed.   LABORATORY DATA:  CBC    Component Value Date/Time   WBC 5.2 04/12/2014 1035   RBC 4.59 04/12/2014 1035   HGB 11.9* 04/12/2014 1035   HCT 37.9 04/12/2014 1035   PLT 294 04/12/2014 1035   MCV 82.6 04/12/2014 1035   MCH 25.9* 04/12/2014 1035   MCHC 31.4 04/12/2014 1035   RDW 14.6 04/12/2014 1035    LYMPHSABS 1.9 04/12/2014 1035   MONOABS 0.4 04/12/2014 1035   EOSABS 0.2 04/12/2014 1035   BASOSABS 0.0 04/12/2014 1035   CMP     Component Value Date/Time   NA 135 04/12/2014 1035   K 3.9 04/12/2014 1035   CL 105 04/12/2014 1035   CO2 27 04/12/2014 1035   GLUCOSE 125* 04/12/2014 1035   BUN 16 04/12/2014 1035   CREATININE 0.80 04/12/2014 1035   CALCIUM 8.9 04/12/2014 1035   PROT 7.6 04/12/2014 1035   ALBUMIN 3.7 04/12/2014 1035   AST 23 04/12/2014 1035   ALT 21 04/12/2014 1035   ALKPHOS 79 04/12/2014 1035   BILITOT 0.5 04/12/2014 1035   GFRNONAA 81* 04/12/2014 1035   GFRAA >90 04/12/2014 1035      ASSESSMENT and THERAPY PLAN:   Stage IIIC inflammatory carcinoma of the right breast, triple negative   She is now over  5 years out from her diagnosis and remains without recurrence. She is overall doing well from the perspective of her prior breast cancer. She appears to have no significant posttreatment related complications. Her chest wall exam is benign. We will continue with ongoing observation and plan on seeing her back in 6 months.  Cardiac ectopy  EKG was reviewed and showed occasional PVCs. She will continue to follow with her primary doctor. She has no complaints of palpitations, chest pain, or shortness of breath.  Cancer screening  We will refer her to Dr. Gala Romney for colonoscopy and to Dr. Elonda Husky for a Pap smear.  Rash  She would like to wait on a referral to dermatology. We will give her a prescription for triamcinolone cream as the appearance of the rash is consistent with eczema. If it worsens she was instructed to call we would make the appropriate referral   All questions were answered. The patient knows to call the clinic with any problems, questions or concerns. We can certainly see the patient much sooner if necessary. This note was electronically signed. Molli Hazard 04/12/2014

## 2014-04-13 ENCOUNTER — Other Ambulatory Visit (HOSPITAL_COMMUNITY): Payer: Self-pay | Admitting: Oncology

## 2014-04-13 ENCOUNTER — Encounter (HOSPITAL_COMMUNITY): Payer: Self-pay | Admitting: Oncology

## 2014-04-13 DIAGNOSIS — E559 Vitamin D deficiency, unspecified: Secondary | ICD-10-CM | POA: Insufficient documentation

## 2014-04-13 HISTORY — DX: Vitamin D deficiency, unspecified: E55.9

## 2014-04-13 LAB — CANCER ANTIGEN 27.29: CA 27.29: 16.5 U/mL (ref 0.0–38.6)

## 2014-04-13 LAB — VITAMIN D 25 HYDROXY (VIT D DEFICIENCY, FRACTURES): VIT D 25 HYDROXY: 15.2 ng/mL — AB (ref 30.0–100.0)

## 2014-04-13 MED ORDER — ERGOCALCIFEROL 1.25 MG (50000 UT) PO CAPS: 50000.0000 [IU] | ORAL_CAPSULE | ORAL | Status: DC

## 2014-04-26 ENCOUNTER — Emergency Department (HOSPITAL_COMMUNITY)
Admission: EM | Admit: 2014-04-26 | Discharge: 2014-04-26 | Disposition: A | Discharge status: Home / Self Care | Payer: Medicare Other | Attending: Emergency Medicine | Admitting: Emergency Medicine

## 2014-04-26 ENCOUNTER — Emergency Department (HOSPITAL_COMMUNITY): Payer: Medicare Other

## 2014-04-26 ENCOUNTER — Encounter (HOSPITAL_COMMUNITY): Payer: Self-pay | Admitting: Emergency Medicine

## 2014-04-26 DIAGNOSIS — M545 Low back pain, unspecified: Secondary | ICD-10-CM

## 2014-04-26 DIAGNOSIS — K219 Gastro-esophageal reflux disease without esophagitis: Secondary | ICD-10-CM | POA: Insufficient documentation

## 2014-04-26 DIAGNOSIS — Z880 Allergy status to penicillin: Secondary | ICD-10-CM | POA: Diagnosis not present

## 2014-04-26 DIAGNOSIS — M5440 Lumbago with sciatica, unspecified side: Secondary | ICD-10-CM | POA: Diagnosis not present

## 2014-04-26 DIAGNOSIS — Z79899 Other long term (current) drug therapy: Secondary | ICD-10-CM | POA: Diagnosis not present

## 2014-04-26 DIAGNOSIS — M1990 Unspecified osteoarthritis, unspecified site: Secondary | ICD-10-CM | POA: Insufficient documentation

## 2014-04-26 DIAGNOSIS — Z791 Long term (current) use of non-steroidal anti-inflammatories (NSAID): Secondary | ICD-10-CM | POA: Insufficient documentation

## 2014-04-26 DIAGNOSIS — Z86718 Personal history of other venous thrombosis and embolism: Secondary | ICD-10-CM | POA: Diagnosis not present

## 2014-04-26 DIAGNOSIS — Z853 Personal history of malignant neoplasm of breast: Secondary | ICD-10-CM | POA: Diagnosis not present

## 2014-04-26 DIAGNOSIS — M549 Dorsalgia, unspecified: Secondary | ICD-10-CM | POA: Diagnosis present

## 2014-04-26 LAB — URINALYSIS, ROUTINE W REFLEX MICROSCOPIC
Bilirubin Urine: NEGATIVE
Glucose, UA: NEGATIVE mg/dL
Ketones, ur: NEGATIVE mg/dL
LEUKOCYTES UA: NEGATIVE
Nitrite: NEGATIVE
PH: 6 (ref 5.0–8.0)
Protein, ur: NEGATIVE mg/dL
Specific Gravity, Urine: 1.025 (ref 1.005–1.030)
Urobilinogen, UA: 0.2 mg/dL (ref 0.0–1.0)

## 2014-04-26 LAB — URINE MICROSCOPIC-ADD ON

## 2014-04-26 MED ORDER — HYDROCODONE-ACETAMINOPHEN 7.5-325 MG PO TABS: 1.0000 | ORAL_TABLET | Freq: Four times a day (QID) | ORAL | Status: DC | PRN

## 2014-04-26 MED ORDER — NAPROXEN 500 MG PO TABS: 500.0000 mg | ORAL_TABLET | Freq: Two times a day (BID) | ORAL | Status: DC

## 2014-04-26 MED ORDER — CYCLOBENZAPRINE HCL 10 MG PO TABS: 10.0000 mg | ORAL_TABLET | Freq: Three times a day (TID) | ORAL | Status: DC | PRN

## 2014-04-26 NOTE — ED Notes (Signed)
Patient with c/o lower back pain starting today. No injury. Denies loss of bowel/bladder controlled. H/o spasms in back but states this pain is different. Able to ambulate with steady gait.

## 2014-04-26 NOTE — ED Notes (Signed)
nad noted prior to dc. Dc instructions reviewed and explained. Voiced understanding.  

## 2014-04-26 NOTE — Discharge Instructions (Signed)
Back Pain, Adult °Back pain is very common. The pain often gets better over time. The cause of back pain is usually not dangerous. Most people can learn to manage their back pain on their own.  °HOME CARE  °· Stay active. Start with short walks on flat ground if you can. Try to walk farther each day. °· Do not sit, drive, or stand in one place for more than 30 minutes. Do not stay in bed. °· Do not avoid exercise or work. Activity can help your back heal faster. °· Be careful when you bend or lift an object. Bend at your knees, keep the object close to you, and do not twist. °· Sleep on a firm mattress. Lie on your side, and bend your knees. If you lie on your back, put a pillow under your knees. °· Only take medicines as told by your doctor. °· Put ice on the injured area. °¨ Put ice in a plastic bag. °¨ Place a towel between your skin and the bag. °¨ Leave the ice on for 15-20 minutes, 03-04 times a day for the first 2 to 3 days. After that, you can switch between ice and heat packs. °· Ask your doctor about back exercises or massage. °· Avoid feeling anxious or stressed. Find good ways to deal with stress, such as exercise. °GET HELP RIGHT AWAY IF:  °· Your pain does not go away with rest or medicine. °· Your pain does not go away in 1 week. °· You have new problems. °· You do not feel well. °· The pain spreads into your legs. °· You cannot control when you poop (bowel movement) or pee (urinate). °· Your arms or legs feel weak or lose feeling (numbness). °· You feel sick to your stomach (nauseous) or throw up (vomit). °· You have belly (abdominal) pain. °· You feel like you may pass out (faint). °MAKE SURE YOU:  °· Understand these instructions. °· Will watch your condition. °· Will get help right away if you are not doing well or get worse. °Document Released: 07/17/2007 Document Revised: 04/22/2011 Document Reviewed: 06/01/2013 °ExitCare® Patient Information ©2015 [employer]. This information is not intended  to replace advice given to you by your health care provider. Make sure you discuss any questions you have with your health care provider. ° °

## 2014-04-28 NOTE — ED Provider Notes (Signed)
CSN: [id number]     Arrival date & time 04/26/14  3536 History   First MD Initiated Contact with Patient 04/26/14 1049     Chief Complaint  Patient presents with  . Back Pain     (Consider location/radiation/quality/duration/timing/severity/associated sxs/prior Treatment) HPI  Wendy Gilbert is a 54 y.o. female who presents to the Emergency Department complaining of low back pain that began few hours prior to ED arrival.  She reports a pulling sensation to her lower back that is worse with certain movements and improves with rest.  She denies known injury.  She also denies numbness or weakness of the extremities, abd pain, urine or bowel changes, fever or chills.  She has taken ibuprofen without relief.     Past Medical History  Diagnosis Date  . DVT (deep venous thrombosis) 10/2008    left arm  . GERD (gastroesophageal reflux disease)   . Sinusitis   . PONV (postoperative nausea and vomiting)   . Headache(784.0)     sinus  . Arthritis     "all over"  . Seasonal allergies   . 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 Vitamin D deficiency 04/13/2014   Past Surgical History  Procedure Laterality Date  . Knee arthroscopy Left 11/1996  . Cesarean section  09/1999  . Dilation and curettage of uterus  1978  . Portacath placement  08/09/2008  . Incision and drainage abscess  10/25/2008    and debridement left axillary abscess, abd. wall abscess, left thigh abscess  . Port-a-cath removal Left 05/07/2012    Procedure: REMOVAL PORT-A-CATH;  Surgeon: Odis Hollingshead, MD;  Location: [facility];  Service: General;  Laterality: Left;  . Tubal ligation    . Mastectomy Bilateral 02/23/2009    right modified radical, left simple  . Mastectomy  2011     bilateral   . Total knee arthroplasty Left 08/10/2013    Procedure: LEFT TOTAL KNEE ARTHROPLASTY;  Surgeon: Tobi Bastos, MD;  Location: [facility];  Service: Orthopedics;  Laterality: Left;   Family History  Problem  Relation Age of Onset  . Breast cancer Mother   . Cancer Mother     breast, colon, ovarian  . Heart disease Father   . Cancer Paternal Uncle     pancreatic   History  Substance Use Topics  . Smoking status: Never Smoker   . Smokeless tobacco: Never Used  . Alcohol Use: No   OB History    No data available     Review of Systems  Constitutional: Negative for fever.  Respiratory: Negative for shortness of breath.   Gastrointestinal: Negative for vomiting, abdominal pain and constipation.  Genitourinary: Negative for dysuria, hematuria, flank pain, decreased urine volume and difficulty urinating.  Musculoskeletal: Positive for back pain. Negative for joint swelling.  Skin: Negative for rash.  Neurological: Negative for weakness and numbness.  All other systems reviewed and are negative.     Allergies  Demerol; Erythromycin; Adhesive; Cephalexin; and Penicillins  Home Medications   Prior to Admission medications   Medication Sig Start Date End Date Taking? Authorizing Provider  hydrochlorothiazide (HYDRODIURIL) 25 MG tablet Take 25 mg by mouth daily.   Yes Historical Provider, MD  ibuprofen (ADVIL,MOTRIN) 200 MG tablet Take 200 mg by mouth every 6 (six) hours as needed.   Yes Historical Provider, MD  magnesium gluconate (MAGONATE) 500 MG tablet Take 500 mg by mouth daily.   Yes Historical Provider, MD  omeprazole ([place])  20 MG capsule Take 20 mg by mouth daily.     Yes Historical Provider, MD  triamcinolone ointment (KENALOG) 0.5 % Apply 1 application topically 2 (two) times daily. 04/12/14  Yes Baird Cancer, PA-C  cyclobenzaprine (FLEXERIL) 10 MG tablet Take 1 tablet (10 mg total) by mouth 3 (three) times daily as needed. 04/26/14   Tammi Rylan Kaufmann, PA-C  HYDROcodone-acetaminophen (NORCO) 7.5-325 MG per tablet Take 1 tablet by mouth every 6 (six) hours as needed for moderate pain. 04/26/14   Tammi Akesha Uresti, PA-C  naproxen (NAPROSYN) 500 MG tablet Take 1 tablet (500 mg  total) by mouth 2 (two) times daily with a meal. 04/26/14   Tammi Manuel Dall, PA-C   BP 145/88 mmHg  Pulse 96  Temp(Src) 97.8 F (36.6 C) (Oral)  Resp 16  Ht 5\' 4"  (1.626 m)  Wt 203 lb (92.08 kg)  BMI 34.83 kg/m2  SpO2 99% Physical Exam  Constitutional: She is oriented to person, place, and time. She appears well-developed and well-nourished. No distress.  HENT:  Head: Normocephalic and atraumatic.  Neck: Normal range of motion. Neck supple.  Cardiovascular: Normal rate, regular rhythm, normal heart sounds and intact distal pulses.   No murmur heard. Pulmonary/Chest: Effort normal and breath sounds normal. No respiratory distress.  Abdominal: Soft. She exhibits no distension. There is no tenderness.  Musculoskeletal: She exhibits tenderness. She exhibits no edema.       Lumbar back: She exhibits tenderness and pain. She exhibits normal range of motion, no swelling, no deformity, no laceration and normal pulse.  ttp of the bilateral lumbar paraspinal muscles.  No spinal tenderness.  DP pulses are brisk and symmetrical.  Distal sensation intact.  Hip Flexors/Extensors are intact.  Pt has 5/5 strength against resistance of bilateral lower extremities.     Neurological: She is alert and oriented to person, place, and time. She has normal strength. No sensory deficit. She exhibits normal muscle tone. Coordination and gait normal.  Reflex Scores:      Patellar reflexes are 2+ on the right side and 2+ on the left side.      Achilles reflexes are 2+ on the right side and 2+ on the left side. Skin: Skin is warm and dry. No rash noted.  Nursing note and vitals reviewed.   ED Course  Procedures (including critical care time) Labs Review Labs Reviewed  URINALYSIS, ROUTINE W REFLEX MICROSCOPIC - Abnormal; Notable for the following:    Hgb urine dipstick TRACE (*)    All other components within normal limits  URINE MICROSCOPIC-ADD ON    Imaging Review Dg Lumbar Spine Complete  04/26/2014    CLINICAL DATA:  Low back pain since yesterday. Back spasms for 5 years. No known injury.  EXAM: LUMBAR SPINE - COMPLETE 4+ VIEW  COMPARISON:  CT 08/03/2008  FINDINGS: Degenerative disc disease at L4-5 and L5-S1 with disc space narrowing. Degenerative facet disease from L3-4 through L5-S1. No fracture. No malalignment. SI joints are symmetric and unremarkable.  IMPRESSION: Degenerative disc and facet disease.  No acute bony abnormality.   Electronically Signed   By: Rolm Baptise M.D.   On: 04/26/2014 12:47      EKG Interpretation None      MDM   Final diagnoses:  Bilateral low back pain without sciatica    Pt is well appearing, ambulates with a steady gait.  No focal neuro deficits.  No concerning sx's for emergent neurological or infectious process.  Pt appears stable for d/c.  Agrees  to close f/u with her PMD     Patrice Paradise, PA-C 04/28/14 2144  Carmin Muskrat, MD 04/30/14 [phone]

## 2014-05-03 ENCOUNTER — Encounter (HOSPITAL_BASED_OUTPATIENT_CLINIC_OR_DEPARTMENT_OTHER): Payer: Medicare Other

## 2014-05-03 DIAGNOSIS — Z853 Personal history of malignant neoplasm of breast: Secondary | ICD-10-CM

## 2014-05-03 DIAGNOSIS — R21 Rash and other nonspecific skin eruption: Secondary | ICD-10-CM

## 2014-05-03 DIAGNOSIS — C50911 Malignant neoplasm of unspecified site of right female breast: Secondary | ICD-10-CM | POA: Diagnosis not present

## 2014-05-03 LAB — C-REACTIVE PROTEIN: CRP: 1 mg/dL — ABNORMAL HIGH (ref <0.60)

## 2014-05-03 LAB — SEDIMENTATION RATE: SED RATE: 30 mm/h — AB (ref 0–22)

## 2014-05-03 LAB — POTASSIUM: POTASSIUM: 3.7 mmol/L (ref 3.5–5.1)

## 2014-05-03 NOTE — Progress Notes (Signed)
Labs drawn for ana,crpp,esr,k 

## 2014-05-04 LAB — FANA STAINING PATTERNS: Speckled Pattern: 1:160 {titer} — ABNORMAL HIGH

## 2014-05-04 LAB — ANTINUCLEAR ANTIBODIES, IFA

## 2014-05-09 ENCOUNTER — Other Ambulatory Visit (HOSPITAL_COMMUNITY): Payer: Self-pay | Admitting: Hematology & Oncology

## 2014-05-10 ENCOUNTER — Encounter (HOSPITAL_COMMUNITY): Payer: Self-pay | Admitting: Lab

## 2014-05-10 NOTE — Progress Notes (Signed)
Referral sent to [facility] Dr Gavin Pound, rheumatology records faxed on 3/29

## 2014-10-13 ENCOUNTER — Other Ambulatory Visit (HOSPITAL_COMMUNITY): Payer: Medicare Other

## 2014-10-13 ENCOUNTER — Ambulatory Visit (HOSPITAL_COMMUNITY): Payer: Medicare Other | Admitting: Oncology

## 2014-10-13 NOTE — Progress Notes (Signed)
No show

## 2014-10-13 NOTE — Assessment & Plan Note (Deleted)
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 Disease was triple negative with positive supraclavicular and infraclavicular lymph nodes. She also had clinically positive axillary nodes. The mass was 12 x 11.5 cm with diffuse peau d'orange changes of the right breast. She received dose dense FEC for 5 cycles and then was switched to carboplatin and Taxotere 4 cycles. She underwent bilateral mastectomies which were negative for disease. She completed adjuvant radiation with Dr. Tammi Klippel in March 2011. She has a history of DVT of the left upper extremity and completed anticoagulation, removal of her Port-A-Cath, and has had no recurrence.  On her last encounter with [place], she was noted to be Vit D deficient.  She was started on appropriate high-dose Vit D x 8 weeks.  She is currently on Vit D 1000 units daily.  Labs today: CBC diff, CMET, Vit D.  Return in 6 months for follow-up.  If all is well in 6 months, we may consider annual visits per NCCN guidelines as she will be over 5 years out from completion of treatment.

## 2014-10-27 ENCOUNTER — Encounter (HOSPITAL_COMMUNITY): Payer: Medicare Other | Attending: Hematology & Oncology | Admitting: Oncology

## 2014-10-27 ENCOUNTER — Encounter (HOSPITAL_COMMUNITY): Payer: Medicare Other

## 2014-10-27 VITALS — BP 164/96 | HR 103 | Temp 98.1°F | Resp 20 | Wt 206.5 lb

## 2014-10-27 DIAGNOSIS — C50911 Malignant neoplasm of unspecified site of right female breast: Secondary | ICD-10-CM | POA: Diagnosis present

## 2014-10-27 DIAGNOSIS — E559 Vitamin D deficiency, unspecified: Secondary | ICD-10-CM

## 2014-10-27 LAB — CBC WITH DIFFERENTIAL/PLATELET
Basophils Absolute: 0.1 10*3/uL (ref 0.0–0.1)
Basophils Relative: 1 %
EOS ABS: 0.2 10*3/uL (ref 0.0–0.7)
EOS PCT: 4 %
HCT: 40.1 % (ref 36.0–46.0)
Hemoglobin: 12.5 g/dL (ref 12.0–15.0)
Lymphocytes Relative: 40 %
Lymphs Abs: 2.4 10*3/uL (ref 0.7–4.0)
MCH: 25.6 pg — AB (ref 26.0–34.0)
MCHC: 31.2 g/dL (ref 30.0–36.0)
MCV: 82 fL (ref 78.0–100.0)
Monocytes Absolute: 0.4 10*3/uL (ref 0.1–1.0)
Monocytes Relative: 6 %
Neutro Abs: 2.9 10*3/uL (ref 1.7–7.7)
Neutrophils Relative %: 49 %
PLATELETS: 309 10*3/uL (ref 150–400)
RBC: 4.89 MIL/uL (ref 3.87–5.11)
RDW: 14.8 % (ref 11.5–15.5)
WBC: 5.9 10*3/uL (ref 4.0–10.5)

## 2014-10-27 LAB — COMPREHENSIVE METABOLIC PANEL
ALT: 25 U/L (ref 14–54)
AST: 29 U/L (ref 15–41)
Albumin: 4.1 g/dL (ref 3.5–5.0)
Alkaline Phosphatase: 80 U/L (ref 38–126)
Anion gap: 8 (ref 5–15)
BUN: 17 mg/dL (ref 6–20)
CHLORIDE: 106 mmol/L (ref 101–111)
CO2: 24 mmol/L (ref 22–32)
CREATININE: 0.79 mg/dL (ref 0.44–1.00)
Calcium: 8.7 mg/dL — ABNORMAL LOW (ref 8.9–10.3)
GFR calc non Af Amer: >60 mL/min (ref >60)
Glucose, Bld: 143 mg/dL — ABNORMAL HIGH (ref 65–99)
Potassium: 3.8 mmol/L (ref 3.5–5.1)
SODIUM: 138 mmol/L (ref 135–145)
Total Bilirubin: 0.5 mg/dL (ref 0.3–1.2)
Total Protein: 8 g/dL (ref 6.5–8.1)

## 2014-10-27 NOTE — Patient Instructions (Signed)
[facility] Discharge Instructions  RECOMMENDATIONS MADE BY THE CONSULTANT AND ANY TEST RESULTS WILL BE SENT TO YOUR REFERRING PHYSICIAN.  Exam done and seen by Kirby Crigler PA-C Return in 40months for labs and follow up  Thank you for choosing [facility] to provide your oncology and hematology care.  To afford each patient quality time with our provider, please arrive at least 15 minutes before your scheduled appointment time.    You need to re-schedule your appointment should you arrive 10 or more minutes late.  We strive to give you quality time with our providers, and arriving late affects you and other patients whose appointments are after yours.  Also, if you no show three or more times for appointments you may be dismissed from the clinic at the providers discretion.     Again, thank you for choosing [facility].  Our hope is that these requests will decrease the amount of time that you wait before being seen by our physicians.       _____________________________________________________________  Should you have questions after your visit to [facility], please contact our office at [phone] between the hours of 8:30 a.m. and 4:30 p.m.  Voicemails left after 4:30 p.m. will not be returned until the following business day.  For prescription refill requests, have your pharmacy contact our office.

## 2014-10-27 NOTE — Assessment & Plan Note (Addendum)
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 Disease was triple negative with positive supraclavicular and infraclavicular lymph nodes. She also had clinically positive axillary nodes. The mass was 12 x 11.5 cm with diffuse peau d'orange changes of the right breast. Right breast was markedly enlarged. She received dose dense FEC for 5 cycles and then was switched to carboplatin and Taxotere 4 cycles. She underwent bilateral mastectomies which were negative for disease. She completed adjuvant radiation with Dr. Tammi Klippel in March 2011. She has a history of DVT of the left upper extremity and completed anticoagulation, removal of her Port-A-Cath, and has had no recurrence.  Labs today: CBC diff, CMET, Vit D level  Labs in 6 months: CBC diff, CMET, Vit D level  She has a follow-up appointment with her primary care provider in November 2016.  I have recommended she follow-up with her primary car provider as scheduled.  I have recommended warm compresses to the cyst and if it were to open, I recommended she use neosporin or similar.  Return in 6 months for follow-up.

## 2014-10-27 NOTE — Progress Notes (Signed)
Wendy Graves, DO [address] [place] [address]  Inflammatory carcinoma of breast, right - Plan: CBC with Differential, Comprehensive metabolic panel  Vitamin D deficiency - Plan: Vitamin D 25 hydroxy  CURRENT THERAPY: Surveillance per NCCN guidelines  INTERVAL HISTORY Wendy Gilbert 54 y.o. 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 Disease was triple negative with positive supraclavicular and infraclavicular lymph nodes. She also had clinically positive axillary nodes. The mass was 12 x 11.5 cm with diffuse peau d'orange changes of the right breast. Right breast was markedly enlarged. She received dose dense FEC for 5 cycles and then was switched to carboplatin and Taxotere 4 cycles. She underwent bilateral mastectomies which were negative for disease. She completed adjuvant radiation with Dr. Tammi Klippel in March 2011. She has a history of DVT of the left upper extremity and completed anticoagulation, removal of her Port-A-Cath, and has had no recurrence.  I personally reviewed and went over laboratory results with the patient.  The results are noted within this dictation.  We will update labs today.  She notes a right ring finger cyst that has been evaluated by her rheumatologist.  The patient notes that it has grown in size.  She notes that it is a little more tender than before.  She was told that it could be removed, but it would come back.    She denies any complaints over the past 6 months otherwise.   Past Medical History  Diagnosis Date  . DVT (deep venous thrombosis) 10/2008    left arm  . GERD (gastroesophageal reflux disease)   . Sinusitis   . PONV (postoperative nausea and vomiting)   . Headache(784.0)     sinus  . Arthritis     "all over"  . Seasonal allergies   . 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 Vitamin D deficiency 04/13/2014    has Inflammatory carcinoma of right breast; DVT (deep  venous thrombosis); Osteoarthritis of left knee; Morbid obesity; Total knee replacement status; Sinus tachycardia; Lymphedema of upper extremity; and Vitamin D deficiency on her problem list.     is allergic to demerol; erythromycin; adhesive; cephalexin; and penicillins.  Current Outpatient Prescriptions on File Prior to Visit  Medication Sig Dispense Refill  . ibuprofen (ADVIL,MOTRIN) 200 MG tablet Take 200 mg by mouth every 6 (six) hours as needed.    Marland Kitchen omeprazole (PRILOSEC) 20 MG capsule Take 20 mg by mouth daily.      . magnesium gluconate (MAGONATE) 500 MG tablet Take 500 mg by mouth daily.    . [DISCONTINUED] potassium chloride SA (K-DUR,KLOR-CON) 20 MEQ tablet Take 1/2 tablet daily (Patient taking differently: Take 10 mEq by mouth daily. ) 30 tablet 1   No current facility-administered medications on file prior to visit.    Past Surgical History  Procedure Laterality Date  . Knee arthroscopy Left 11/1996  . Cesarean section  09/1999  . Dilation and curettage of uterus  1978  . Portacath placement  08/09/2008  . Incision and drainage abscess  10/25/2008    and debridement left axillary abscess, abd. wall abscess, left thigh abscess  . Port-a-cath removal Left 05/07/2012    Procedure: REMOVAL PORT-A-CATH;  Surgeon: Odis Hollingshead, MD;  Location: [facility];  Service: General;  Laterality: Left;  . Tubal ligation    . Mastectomy Bilateral 02/23/2009    right modified radical, left simple  . Mastectomy  2011     bilateral   . Total knee arthroplasty Left 08/10/2013    Procedure: LEFT TOTAL KNEE ARTHROPLASTY;  Surgeon: Tobi Bastos, MD;  Location: [facility];  Service: Orthopedics;  Laterality: Left;    Denies any headaches, dizziness, double vision, fevers, chills, night sweats, nausea, vomiting, diarrhea, constipation, chest pain, heart palpitations, shortness of breath, blood in stool, black tarry stool, urinary pain, urinary burning, urinary frequency, hematuria.    PHYSICAL EXAMINATION  ECOG PERFORMANCE STATUS: 0 - Asymptomatic  Filed Vitals:   10/27/14 1048  BP: 164/96  Pulse: 103  Temp: 98.1 F (36.7 C)  Resp: 20    GENERAL:alert, no distress, well nourished, well developed, comfortable, cooperative, obese, smiling and unaccompanied SKIN: skin color, texture, turgor are normal, no rashes or significant lesions HEAD: Normocephalic, No masses, lesions, tenderness or abnormalities EYES: normal, PERRLA, EOMI, Conjunctiva are pink and non-injected EARS: External ears normal OROPHARYNX:lips, buccal mucosa, and tongue normal and mucous membranes are moist  NECK: supple, no adenopathy, thyroid normal size, non-tender, without nodularity, no stridor, non-tender, trachea midline LYMPH:  no palpable lymphadenopathy, no hepatosplenomegaly BREAST:B/L post-mastectomy site well healed and free of suspicious changes LUNGS: clear to auscultation and percussion HEART: regular rate & rhythm, no murmurs, no gallops, S1 normal, S2 normal and physiological split S2 ABDOMEN:abdomen soft, non-tender, obese, normal bowel sounds and no masses or organomegaly BACK: Back symmetric, no curvature., No CVA tenderness EXTREMITIES:less then 2 second capillary refill, no edema, no skin discoloration, no clubbing, no cyanosis, cyst as above with a head and medially two black heads    NEURO: alert & oriented x 3 with fluent speech, no focal motor/sensory deficits, gait normal   LABORATORY DATA: CBC    Component Value Date/Time   WBC 5.9 10/27/2014 1042   RBC 4.89 10/27/2014 1042   HGB 12.5 10/27/2014 1042   HCT 40.1 10/27/2014 1042   PLT 309 10/27/2014 1042   MCV 82.0 10/27/2014 1042   MCH 25.6* 10/27/2014 1042   MCHC 31.2 10/27/2014 1042   RDW 14.8 10/27/2014 1042   LYMPHSABS 2.4 10/27/2014 1042   MONOABS 0.4 10/27/2014 1042   EOSABS 0.2 10/27/2014 1042   BASOSABS 0.1 10/27/2014 1042      Chemistry      Component Value Date/Time   NA 135 04/12/2014 1035     K 3.7 05/03/2014 0939   CL 105 04/12/2014 1035   CO2 27 04/12/2014 1035   BUN 16 04/12/2014 1035   CREATININE 0.80 04/12/2014 1035      Component Value Date/Time   CALCIUM 8.9 04/12/2014 1035   ALKPHOS 79 04/12/2014 1035   AST 23 04/12/2014 1035   ALT 21 04/12/2014 1035   BILITOT 0.5 04/12/2014 1035        PENDING LABS:   RADIOGRAPHIC STUDIES:  No results found.   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 Disease was triple negative with positive supraclavicular and infraclavicular lymph nodes. She also had clinically positive axillary nodes. The mass was 12 x 11.5 cm with diffuse peau d'orange changes of the right breast. Right breast was markedly enlarged. She received dose dense FEC for 5 cycles and then was switched to carboplatin and Taxotere 4 cycles. She underwent bilateral mastectomies which were negative for disease. She completed adjuvant radiation with Dr. Tammi Klippel in March 2011. She has a history of DVT of the left upper extremity and completed anticoagulation, removal of  her Port-A-Cath, and has had no recurrence.  Labs today: CBC diff, CMET, Vit D level  Labs in 6 months: CBC diff, CMET, Vit D level  She has a follow-up appointment with her primary care provider in November 2016.  I have recommended she follow-up with her primary car provider as scheduled.  I have recommended warm compresses to the cyst and if it were to open, I recommended she use neosporin or similar.  Return in 6 months for follow-up.     THERAPY PLAN:  NCCN guidelines recommends the following surveillance for invasive breast cancer:  A. History and Physical exam every 4-6 months for 5 years and then every 12 months.  B. Mammography every 12 months  C. Women on Tamoxifen: annual gynecologic assessment every 12 months if uterus is present.  D. Women on aromatase inhibitor or who experience ovarian  failure secondary to treatment should have monitoring of bone health with a bone mineral density determination at baseline and periodically thereafter.  E. Assess and encourage adherence to adjuvant endocrine therapy.  F. Evidence suggests that active lifestyle and achieving and maintaining an ideal body weight (20-25 BMI) may lead to optimal breast cancer outcomes.   All questions were answered. The patient knows to call the clinic with any problems, questions or concerns. We can certainly see the patient much sooner if necessary.  Patient and plan discussed with Dr. Ancil Linsey and she is in agreement with the aforementioned.   This note is electronically signed by: Doy Mince 10/27/2014 11:43 AM

## 2014-10-28 ENCOUNTER — Other Ambulatory Visit (HOSPITAL_COMMUNITY): Payer: Self-pay | Admitting: Oncology

## 2014-10-28 ENCOUNTER — Encounter (HOSPITAL_COMMUNITY): Payer: Self-pay | Admitting: *Deleted

## 2014-10-28 DIAGNOSIS — E559 Vitamin D deficiency, unspecified: Secondary | ICD-10-CM

## 2014-10-28 LAB — VITAMIN D 25 HYDROXY (VIT D DEFICIENCY, FRACTURES): VIT D 25 HYDROXY: 22.8 ng/mL — AB (ref 30.0–100.0)

## 2014-10-28 MED ORDER — ERGOCALCIFEROL 1.25 MG (50000 UT) PO CAPS: 50000.0000 [IU] | ORAL_CAPSULE | ORAL | Status: AC

## 2015-04-26 ENCOUNTER — Ambulatory Visit (HOSPITAL_COMMUNITY): Payer: Medicare Other | Admitting: Oncology

## 2015-04-26 ENCOUNTER — Other Ambulatory Visit (HOSPITAL_COMMUNITY): Payer: Medicare Other

## 2015-04-30 NOTE — Progress Notes (Signed)
[place], DO [address] [place] [address]  Inflammatory carcinoma of breast, right ([place]) - Plan: CBC with Differential, Comprehensive metabolic panel, Vitamin D 25 hydroxy  Vitamin D deficiency - Plan: Vitamin D 25 hydroxy  CURRENT THERAPY: Surveillance per NCCN guidelines  INTERVAL HISTORY Wendy Gilbert 55 y.o. 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 Disease was triple negative with positive supraclavicular and infraclavicular lymph nodes. She also had clinically positive axillary nodes. The mass was 12 x 11.5 cm with diffuse peau d'orange changes of the right breast. Right breast was markedly enlarged. She received dose dense FEC for 5 cycles and then was switched to carboplatin and Taxotere 4 cycles. She underwent bilateral mastectomies which were negative for disease. She completed adjuvant radiation with Dr. Tammi Klippel in March 2011. She has a history of DVT of the left upper extremity and completed anticoagulation, removal of her Port-A-Cath, and has had no recurrence.  I personally reviewed and went over laboratory results with the patient.  The results are noted within this dictation.  We will update labs today.  She shows me a skin lesion on the left side of her temple that she is concerned about.  She thinks it has gotten larger.  She does not have a dermatologist.  Otherwise, she denies any complaints and ROS questioning is negative.   Past Medical History  Diagnosis Date  . DVT (deep venous thrombosis) ([place]) 10/2008    left arm  . GERD (gastroesophageal reflux disease)   . Sinusitis   . PONV (postoperative nausea and vomiting)   . Headache(784.0)     sinus  . Arthritis     "all over"  . Seasonal allergies   . 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 Vitamin D deficiency 04/13/2014    has Inflammatory carcinoma of right breast; DVT (deep venous thrombosis) ([place]); Osteoarthritis of left knee;  Morbid obesity ([place]); Total knee replacement status; Sinus tachycardia ([place]); Lymphedema of upper extremity; and Vitamin D deficiency on her problem list.     is allergic to demerol; erythromycin; adhesive; cephalexin; and penicillins.  Current Outpatient Prescriptions on File Prior to Visit  Medication Sig Dispense Refill  . ibuprofen (ADVIL,MOTRIN) 200 MG tablet Take 200 mg by mouth every 6 (six) hours as needed.    . magnesium gluconate (MAGONATE) 500 MG tablet Take 500 mg by mouth daily.    Marland Kitchen omeprazole (PRILOSEC) 20 MG capsule Take 20 mg by mouth daily.      . [DISCONTINUED] potassium chloride SA (K-DUR,KLOR-CON) 20 MEQ tablet Take 1/2 tablet daily (Patient taking differently: Take 10 mEq by mouth daily. ) 30 tablet 1   No current facility-administered medications on file prior to visit.    Past Surgical History  Procedure Laterality Date  . Knee arthroscopy Left 11/1996  . Cesarean section  09/1999  . Dilation and curettage of uterus  1978  . Portacath placement  08/09/2008  . Incision and drainage abscess  10/25/2008    and debridement left axillary abscess, abd. wall abscess, left thigh abscess  . Port-a-cath removal Left 05/07/2012    Procedure: REMOVAL PORT-A-CATH;  Surgeon: Odis Hollingshead, MD;  Location: [facility];  Service: General;  Laterality: Left;  . Tubal ligation    . Mastectomy Bilateral 02/23/2009    right modified radical, left simple  . Mastectomy  2011     bilateral   . Total  knee arthroplasty Left 08/10/2013    Procedure: LEFT TOTAL KNEE ARTHROPLASTY;  Surgeon: Tobi Bastos, MD;  Location: [facility];  Service: Orthopedics;  Laterality: Left;    Denies any headaches, dizziness, double vision, fevers, chills, night sweats, nausea, vomiting, diarrhea, constipation, chest pain, heart palpitations, shortness of breath, blood in stool, black tarry stool, urinary pain, urinary burning, urinary frequency, hematuria.   PHYSICAL EXAMINATION  ECOG  PERFORMANCE STATUS: 0 - Asymptomatic  Filed Vitals:   05/01/15 0954  BP: 138/93  Pulse: 86  Temp: 97.8 F (36.6 C)  Resp: 18    GENERAL:alert, no distress, well nourished, well developed, comfortable, cooperative, obese, smiling and unaccompanied SKIN: skin color, texture, turgor are normal, no rashes.  Left temporal area skin lesion that is raised with irregular borders with the most anterior area being hyperpigmented and the posterior half being closer to unaffected skin color, entirety of lesion measuring 1.5 cm in size. HEAD: Normocephalic, No masses, lesions, tenderness or abnormalities EYES: normal, PERRLA, EOMI, Conjunctiva are pink and non-injected EARS: External ears normal OROPHARYNX:lips, buccal mucosa, and tongue normal and mucous membranes are moist  NECK: supple, no adenopathy, thyroid normal size, non-tender, without nodularity, no stridor, non-tender, trachea midline LYMPH:  no palpable lymphadenopathy, no hepatosplenomegaly BREAST:B/L post-mastectomy site well healed and free of suspicious changes LUNGS: clear to auscultation and percussion without wheezes, rales, or rhonchi HEART: regular rate & rhythm, no murmurs, no gallops, S1 normal, S2 normal and physiological split S2 ABDOMEN:abdomen soft, non-tender, obese, normal bowel sounds and no masses or organomegaly BACK: Back symmetric, no curvature., No CVA tenderness EXTREMITIES:less then 2 second capillary refill, no edema, no skin discoloration, no clubbing, no cyanosis. NEURO: alert & oriented x 3 with fluent speech, no focal motor/sensory deficits, gait normal   LABORATORY DATA: CBC    Component Value Date/Time   WBC 5.2 05/01/2015 0856   RBC 4.60 05/01/2015 0856   HGB 12.3 05/01/2015 0856   HCT 38.8 05/01/2015 0856   PLT 305 05/01/2015 0856   MCV 84.3 05/01/2015 0856   MCH 26.7 05/01/2015 0856   MCHC 31.7 05/01/2015 0856   RDW 14.4 05/01/2015 0856   LYMPHSABS 1.8 05/01/2015 0856   MONOABS 0.5  05/01/2015 0856   EOSABS 0.2 05/01/2015 0856   BASOSABS 0.1 05/01/2015 0856      Chemistry      Component Value Date/Time   NA 141 05/01/2015 0856   K 4.1 05/01/2015 0856   CL 106 05/01/2015 0856   CO2 27 05/01/2015 0856   BUN 20 05/01/2015 0856   CREATININE 0.72 05/01/2015 0856      Component Value Date/Time   CALCIUM 9.0 05/01/2015 0856   ALKPHOS 74 05/01/2015 0856   AST 31 05/01/2015 0856   ALT 30 05/01/2015 0856   BILITOT 0.6 05/01/2015 0856        PENDING LABS:   RADIOGRAPHIC STUDIES:  No results found.   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 Disease was triple negative with positive supraclavicular and infraclavicular lymph nodes. She also had clinically positive axillary nodes. The mass was 12 x 11.5 cm with diffuse peau d'orange changes of the right breast. Right breast was markedly enlarged. She received dose dense FEC for 5 cycles and then was switched to carboplatin and Taxotere 4 cycles. She underwent bilateral mastectomies which were negative for disease. She completed adjuvant radiation with Dr. Tammi Klippel in March 2011. She has  a history of DVT of the left upper extremity and completed anticoagulation, removal of her Port-A-Cath, and has had no recurrence.  Labs today: CBC diff, CMET, Vit D level  Labs in 12 months: CBC diff, CMET, Vit D level  She is 6 years out from completing her therapy for breast cancer.  She will return in 12 months for follow-up.  THERAPY PLAN:  NCCN guidelines recommends the following surveillance for invasive breast cancer (2.2016):  A. History and Physical exam 1-4 times per year as clinically appropriate for 5 years, then annually.  B. Periodic screening for changes in family history and referral to genetics counseling as indicated  C. Educate, monitor, and refer to lymphedema management.  D. Mammography every 12 months  E. Routine  imaging of reconstructed breast is not indicated.  F. In the absence of clinical signs and symptoms suggestive of recurrent disease, there is no indication for laboratory or imaging studies for metastases screening.  G. Women on Tamoxifen: annual gynecologic assessment every 12 months if uterus is present.  H. Women on aromatase inhibitor or who experience ovarian failure secondary to treatment should have monitoring of bone health with a bone mineral density determination at baseline and periodically thereafter.  I. Assess and encourage adherence to adjuvant endocrine therapy.  J. Evidence suggests that active lifestyle, healthy diet, limited alcohol intake, and achieving and maintaining an ideal body weight (20-25 BMI) may lead to optimal breast cancer outcomes.   All questions were answered. The patient knows to call the clinic with any problems, questions or concerns. We can certainly see the patient much sooner if necessary.  Patient and plan discussed with Dr. Ancil Linsey and she is in agreement with the aforementioned.   This note is electronically signed by: Doy Mince 05/01/2015 2:32 PM

## 2015-04-30 NOTE — Assessment & Plan Note (Signed)
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 Disease was triple negative with positive supraclavicular and infraclavicular lymph nodes. She also had clinically positive axillary nodes. The mass was 12 x 11.5 cm with diffuse peau d'orange changes of the right breast. Right breast was markedly enlarged. She received dose dense FEC for 5 cycles and then was switched to carboplatin and Taxotere 4 cycles. She underwent bilateral mastectomies which were negative for disease. She completed adjuvant radiation with Dr. Tammi Klippel in March 2011. She has a history of DVT of the left upper extremity and completed anticoagulation, removal of her Port-A-Cath, and has had no recurrence.  Labs today: CBC diff, CMET, Vit D level  Labs in 12 months: CBC diff, CMET, Vit D level  She is 6 years out from completing her therapy for breast cancer.  She will return in 12 months for follow-up.

## 2015-05-01 ENCOUNTER — Encounter (HOSPITAL_COMMUNITY): Payer: Medicare Other

## 2015-05-01 ENCOUNTER — Encounter (HOSPITAL_COMMUNITY): Payer: Self-pay | Admitting: Oncology

## 2015-05-01 ENCOUNTER — Other Ambulatory Visit (HOSPITAL_COMMUNITY): Payer: Medicare Other

## 2015-05-01 ENCOUNTER — Encounter (HOSPITAL_COMMUNITY): Payer: Medicare Other | Attending: Oncology | Admitting: Oncology

## 2015-05-01 ENCOUNTER — Ambulatory Visit (HOSPITAL_COMMUNITY): Payer: Medicare Other | Admitting: Oncology

## 2015-05-01 VITALS — BP 138/93 | HR 86 | Temp 97.8°F | Resp 18 | Wt 213.8 lb

## 2015-05-01 DIAGNOSIS — Z9889 Other specified postprocedural states: Secondary | ICD-10-CM | POA: Diagnosis not present

## 2015-05-01 DIAGNOSIS — Z86718 Personal history of other venous thrombosis and embolism: Secondary | ICD-10-CM | POA: Insufficient documentation

## 2015-05-01 DIAGNOSIS — E559 Vitamin D deficiency, unspecified: Secondary | ICD-10-CM | POA: Diagnosis not present

## 2015-05-01 DIAGNOSIS — C50911 Malignant neoplasm of unspecified site of right female breast: Secondary | ICD-10-CM | POA: Diagnosis not present

## 2015-05-01 DIAGNOSIS — Z79899 Other long term (current) drug therapy: Secondary | ICD-10-CM | POA: Diagnosis not present

## 2015-05-01 DIAGNOSIS — C778 Secondary and unspecified malignant neoplasm of lymph nodes of multiple regions: Secondary | ICD-10-CM | POA: Diagnosis not present

## 2015-05-01 DIAGNOSIS — Z853 Personal history of malignant neoplasm of breast: Secondary | ICD-10-CM | POA: Insufficient documentation

## 2015-05-01 DIAGNOSIS — K219 Gastro-esophageal reflux disease without esophagitis: Secondary | ICD-10-CM | POA: Insufficient documentation

## 2015-05-01 LAB — COMPREHENSIVE METABOLIC PANEL
ALT: 30 U/L (ref 14–54)
AST: 31 U/L (ref 15–41)
Albumin: 4 g/dL (ref 3.5–5.0)
Alkaline Phosphatase: 74 U/L (ref 38–126)
Anion gap: 8 (ref 5–15)
BUN: 20 mg/dL (ref 6–20)
CHLORIDE: 106 mmol/L (ref 101–111)
CO2: 27 mmol/L (ref 22–32)
CREATININE: 0.72 mg/dL (ref 0.44–1.00)
Calcium: 9 mg/dL (ref 8.9–10.3)
GFR calc Af Amer: >60 mL/min (ref >60)
GFR calc non Af Amer: >60 mL/min (ref >60)
Glucose, Bld: 125 mg/dL — ABNORMAL HIGH (ref 65–99)
Potassium: 4.1 mmol/L (ref 3.5–5.1)
Sodium: 141 mmol/L (ref 135–145)
Total Bilirubin: 0.6 mg/dL (ref 0.3–1.2)
Total Protein: 7.6 g/dL (ref 6.5–8.1)

## 2015-05-01 LAB — CBC WITH DIFFERENTIAL/PLATELET
BASOS ABS: 0.1 10*3/uL (ref 0.0–0.1)
Basophils Relative: 1 %
EOS ABS: 0.2 10*3/uL (ref 0.0–0.7)
Eosinophils Relative: 5 %
HCT: 38.8 % (ref 36.0–46.0)
Hemoglobin: 12.3 g/dL (ref 12.0–15.0)
Lymphocytes Relative: 34 %
Lymphs Abs: 1.8 10*3/uL (ref 0.7–4.0)
MCH: 26.7 pg (ref 26.0–34.0)
MCHC: 31.7 g/dL (ref 30.0–36.0)
MCV: 84.3 fL (ref 78.0–100.0)
MONO ABS: 0.5 10*3/uL (ref 0.1–1.0)
Monocytes Relative: 9 %
Neutro Abs: 2.7 10*3/uL (ref 1.7–7.7)
Neutrophils Relative %: 51 %
Platelets: 305 10*3/uL (ref 150–400)
RBC: 4.6 MIL/uL (ref 3.87–5.11)
RDW: 14.4 % (ref 11.5–15.5)
WBC: 5.2 10*3/uL (ref 4.0–10.5)

## 2015-05-01 NOTE — Patient Instructions (Signed)
[place] at [facility] Discharge Instructions  RECOMMENDATIONS MADE BY THE CONSULTANT AND ANY TEST RESULTS WILL BE SENT TO YOUR REFERRING PHYSICIAN.  Labs are normal.  I would encourage you to drink more water daily based upon your kidney tests.   I recommend having the skin lesion on the left temple area evaluated by your primary care provider. We will repeat labs in 12 months. We will call you with an update on your Vit D level. Return in 12 months for follow-up.  Sooner if necessary.  Please call with any questions or concerns.  Thank you for choosing [place] at [facility] to provide your oncology and hematology care.  To afford each patient quality time with our provider, please arrive at least 15 minutes before your scheduled appointment time.   Beginning January 23rd 2017 lab work for the [facility] will be done in the  Main lab at [facility] on 1st floor. If you have a lab appointment with the [facility] please come in thru the  Main Entrance and check in at the main information desk  You need to re-schedule your appointment should you arrive 10 or more minutes late.  We strive to give you quality time with our providers, and arriving late affects you and other patients whose appointments are after yours.  Also, if you no show three or more times for appointments you may be dismissed from the clinic at the providers discretion.     Again, thank you for choosing [facility].  Our hope is that these requests will decrease the amount of time that you wait before being seen by our physicians.       _____________________________________________________________  Should you have questions after your visit to [facility], please contact our office at [phone] between the hours of 8:30 a.m. and 4:30 p.m.  Voicemails left after 4:30 p.m. will not be returned until the following business day.  For  prescription refill requests, have your pharmacy contact our office.         Resources For Cancer Patients and their Caregivers ? American Cancer Society: Can assist with transportation, wigs, general needs, runs Look Good Feel Better.        ([phone] 870-1749 ? Cancer Care: Provides financial assistance, online support groups, medication/co-pay assistance.  1-800-813-HOPE 713-279-3982) ? [facility] Assists [place] cancer patients and their families through emotional , educational and financial support.  (425)448-9870 ? [place] DSS Where to apply for food stamps, Medicaid and utility assistance. 3083764050 ? [facility]: Transportation to medical appointments. 816-607-0938 ? Social Security Administration: May apply for disability if have a Stage IV cancer. 239-317-2347 (534)463-4701 ? [employer], Disability and Transit Services: Assists with nutrition, care and transit needs. ([phone] 113-0415

## 2015-05-02 LAB — VITAMIN D 25 HYDROXY (VIT D DEFICIENCY, FRACTURES): VIT D 25 HYDROXY: 33.1 ng/mL (ref 30.0–100.0)

## 2015-11-29 IMAGING — DX DG LUMBAR SPINE COMPLETE 4+V
5 series · 5 of 5 positions shown · non-contrast
Comparison: CT 08/03/2008

CLINICAL DATA: Low back pain since yesterday. Back spasms for 5
years. No known injury.

EXAM:
LUMBAR SPINE - COMPLETE 4+ VIEW

[l-spine ap]
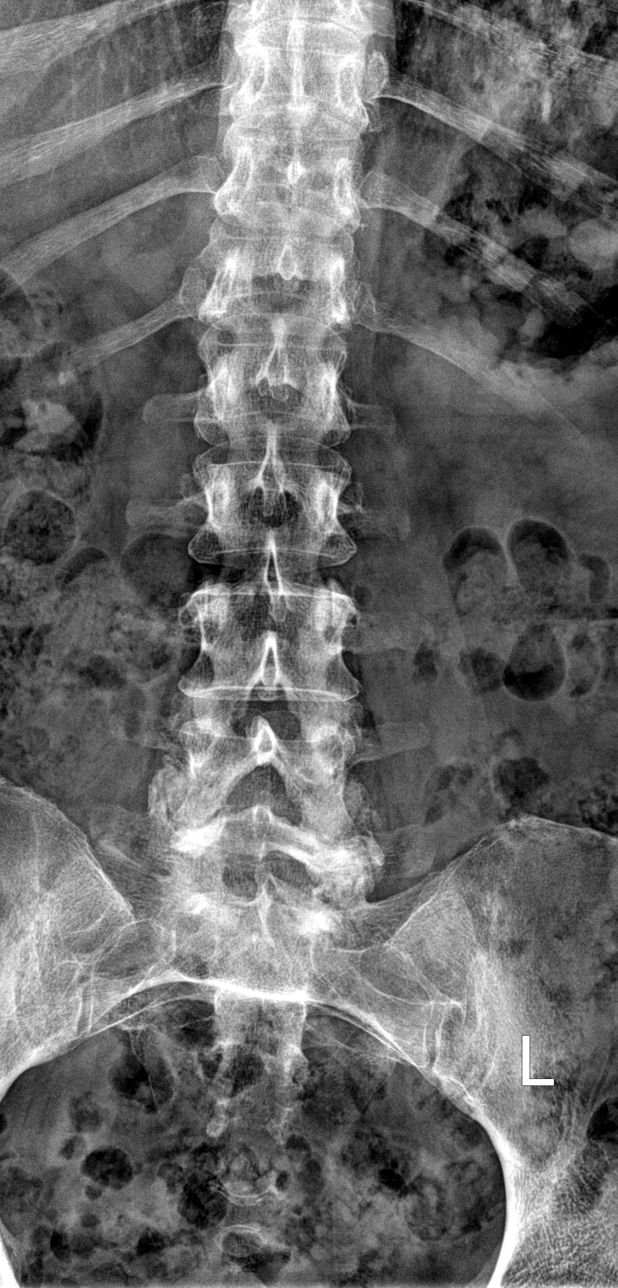

[l-spine obl (1 of 2)]
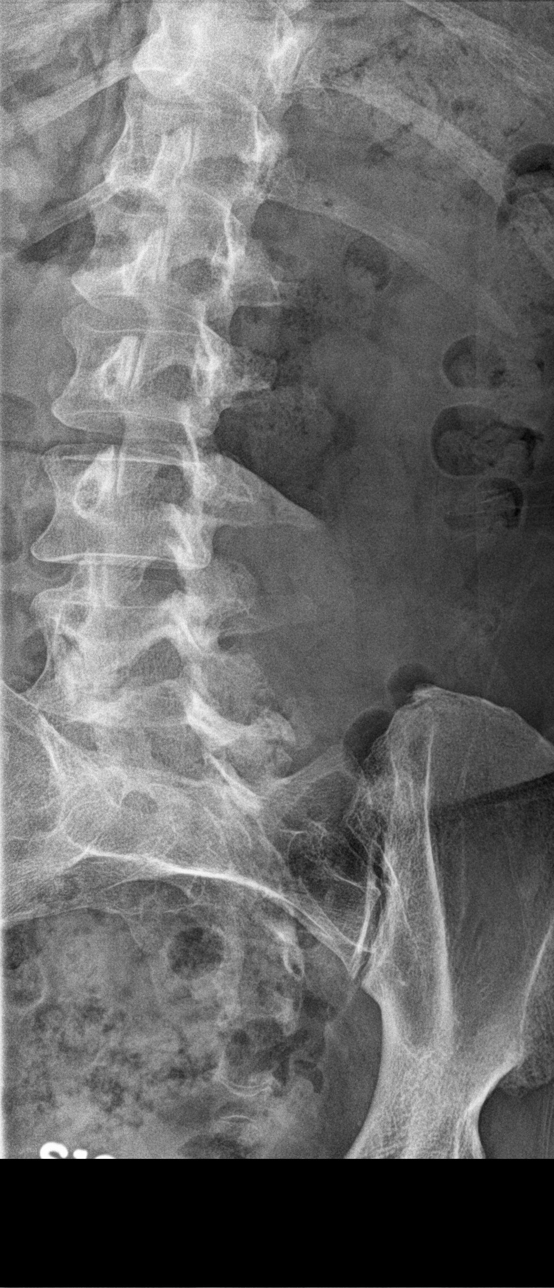

[l-spine obl (2 of 2)]
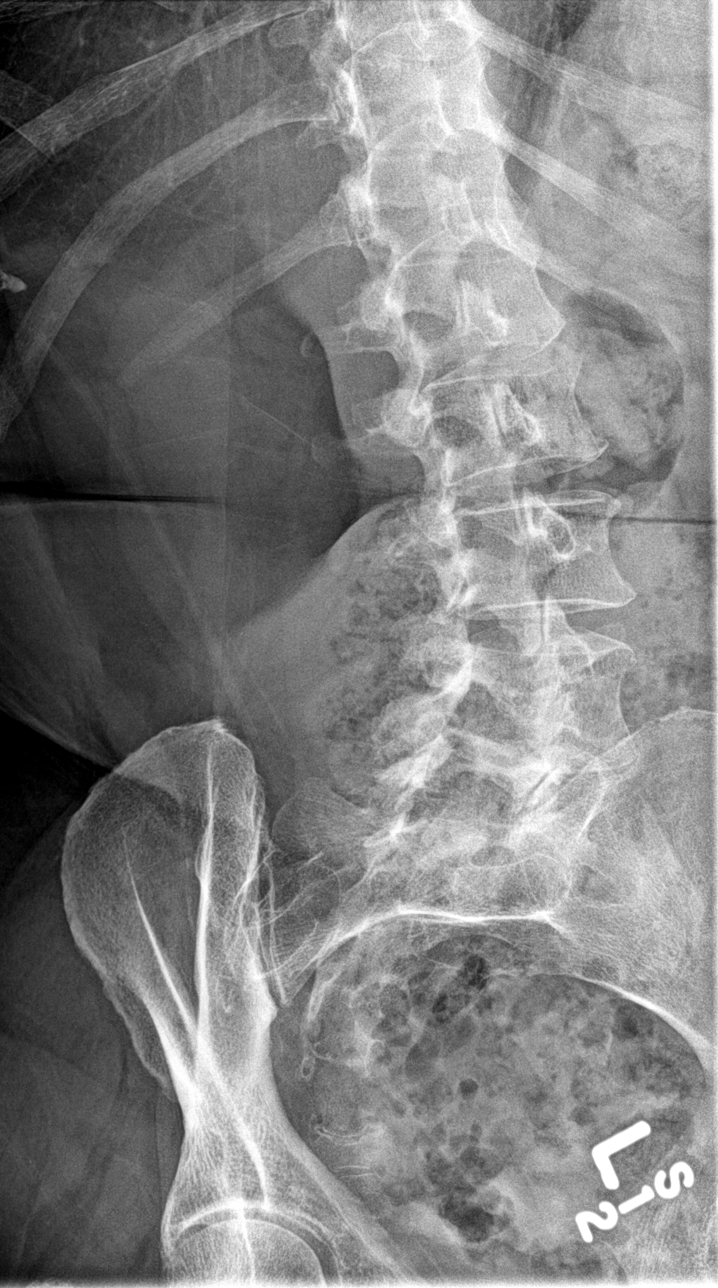

[l-spine lat]
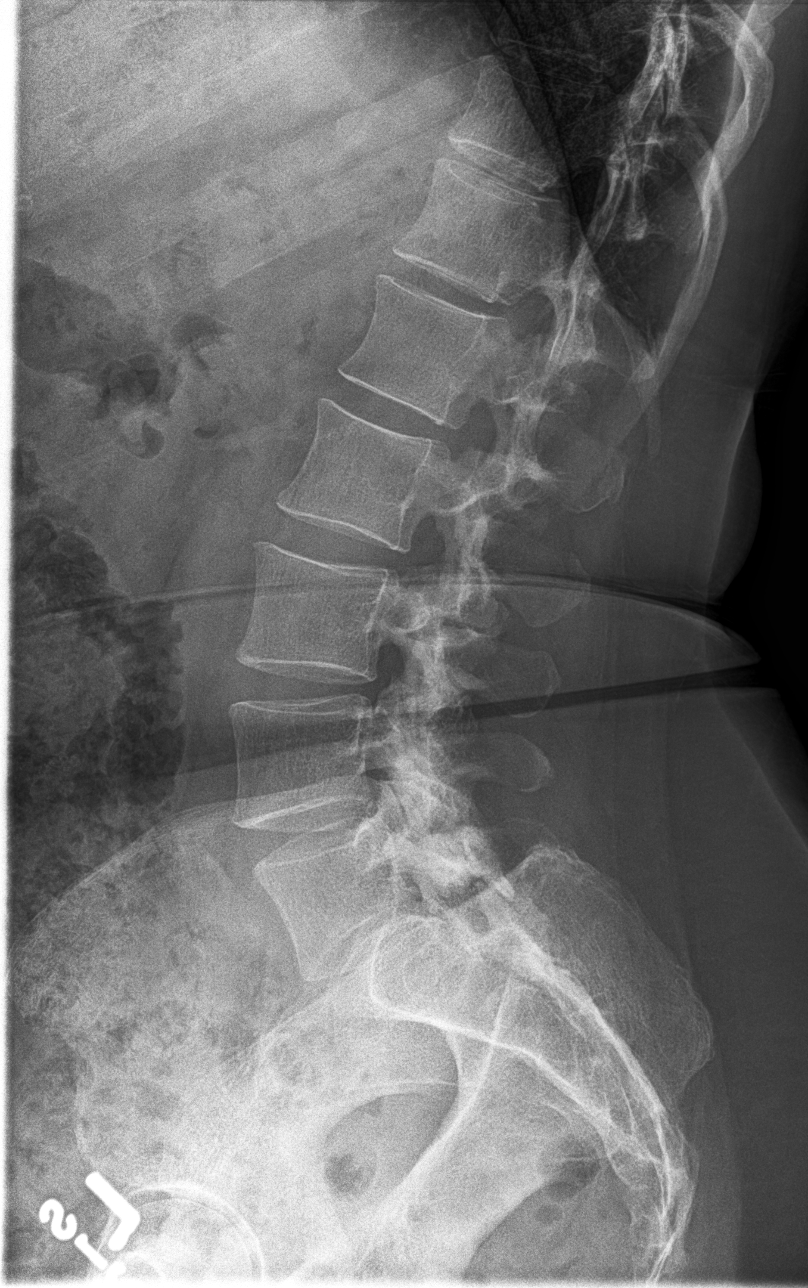

[l-spine spot]
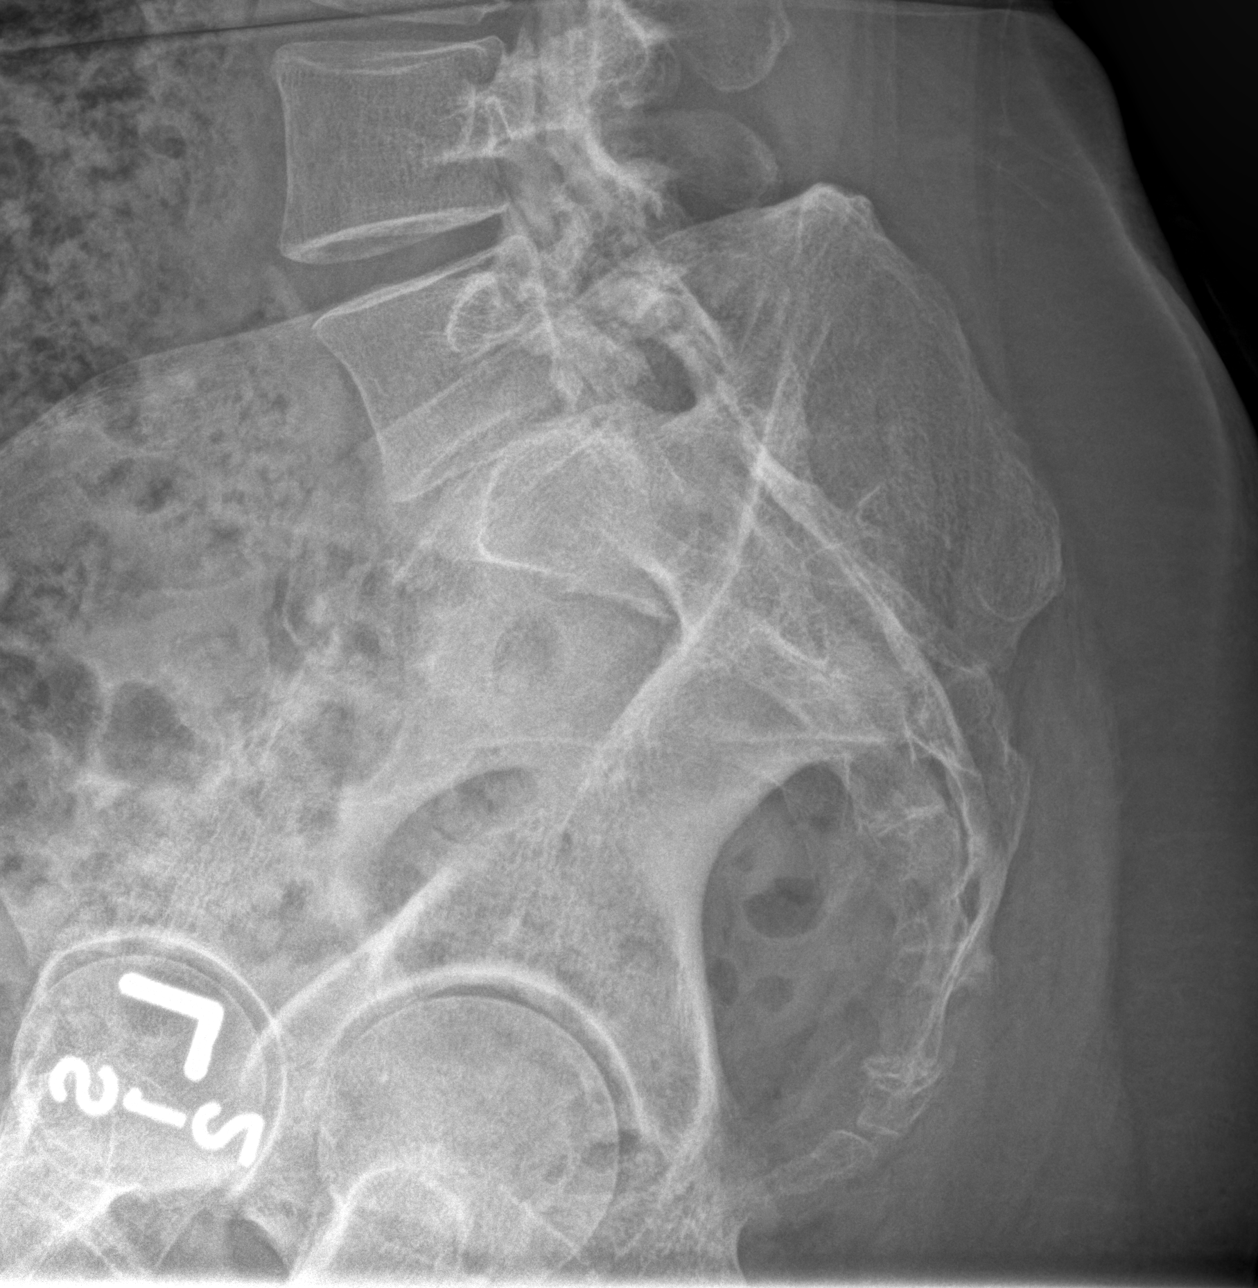

[5 of 5 positions shown; findings below may reference images not displayed]

FINDINGS: Degenerative disc disease at L4-5 and L5-S1 with disc space
narrowing. Degenerative facet disease from L3-4 through L5-S1. No
fracture. No malalignment. SI joints are symmetric and unremarkable.
IMPRESSION: Degenerative disc and facet disease.  No acute bony abnormality.

## 2016-05-02 ENCOUNTER — Other Ambulatory Visit (HOSPITAL_COMMUNITY): Payer: Medicare Other

## 2016-05-02 ENCOUNTER — Ambulatory Visit (HOSPITAL_COMMUNITY): Payer: Medicare Other | Admitting: Oncology

## 2016-06-11 ENCOUNTER — Encounter (HOSPITAL_COMMUNITY): Payer: Self-pay | Admitting: Oncology

## 2016-06-11 ENCOUNTER — Encounter (HOSPITAL_COMMUNITY): Payer: Medicare Other | Attending: Oncology

## 2016-06-11 ENCOUNTER — Encounter (HOSPITAL_BASED_OUTPATIENT_CLINIC_OR_DEPARTMENT_OTHER): Payer: Medicare Other | Admitting: Oncology

## 2016-06-11 VITALS — BP 137/80 | HR 93 | Temp 98.5°F | Resp 18 | Ht 64.0 in | Wt 213.7 lb

## 2016-06-11 DIAGNOSIS — I82622 Acute embolism and thrombosis of deep veins of left upper extremity: Secondary | ICD-10-CM

## 2016-06-11 DIAGNOSIS — E559 Vitamin D deficiency, unspecified: Secondary | ICD-10-CM | POA: Insufficient documentation

## 2016-06-11 DIAGNOSIS — C50911 Malignant neoplasm of unspecified site of right female breast: Secondary | ICD-10-CM

## 2016-06-11 DIAGNOSIS — R7401 Elevation of levels of liver transaminase levels: Secondary | ICD-10-CM

## 2016-06-11 DIAGNOSIS — R740 Nonspecific elevation of levels of transaminase and lactic acid dehydrogenase [LDH]: Secondary | ICD-10-CM | POA: Diagnosis not present

## 2016-06-11 LAB — COMPREHENSIVE METABOLIC PANEL
ALBUMIN: 3.8 g/dL (ref 3.5–5.0)
ALK PHOS: 70 U/L (ref 38–126)
ALT: 57 U/L — AB (ref 14–54)
AST: 49 U/L — ABNORMAL HIGH (ref 15–41)
Anion gap: 8 (ref 5–15)
BILIRUBIN TOTAL: 0.5 mg/dL (ref 0.3–1.2)
BUN: 21 mg/dL — ABNORMAL HIGH (ref 6–20)
CO2: 28 mmol/L (ref 22–32)
Calcium: 9.3 mg/dL (ref 8.9–10.3)
Chloride: 103 mmol/L (ref 101–111)
Creatinine, Ser: 0.98 mg/dL (ref 0.44–1.00)
GFR calc Af Amer: >60 mL/min (ref >60)
GFR calc non Af Amer: >60 mL/min (ref >60)
Glucose, Bld: 117 mg/dL — ABNORMAL HIGH (ref 65–99)
Potassium: 4 mmol/L (ref 3.5–5.1)
Sodium: 139 mmol/L (ref 135–145)
TOTAL PROTEIN: 7.6 g/dL (ref 6.5–8.1)

## 2016-06-11 LAB — CBC WITH DIFFERENTIAL/PLATELET
BASOS PCT: 1 %
Basophils Absolute: 0.1 10*3/uL (ref 0.0–0.1)
Eosinophils Absolute: 0.2 10*3/uL (ref 0.0–0.7)
Eosinophils Relative: 4 %
HEMATOCRIT: 39.5 % (ref 36.0–46.0)
Hemoglobin: 13 g/dL (ref 12.0–15.0)
LYMPHS ABS: 2.1 10*3/uL (ref 0.7–4.0)
Lymphocytes Relative: 39 %
MCH: 28 pg (ref 26.0–34.0)
MCHC: 32.9 g/dL (ref 30.0–36.0)
MCV: 84.9 fL (ref 78.0–100.0)
Monocytes Absolute: 0.5 10*3/uL (ref 0.1–1.0)
Monocytes Relative: 9 %
NEUTROS ABS: 2.5 10*3/uL (ref 1.7–7.7)
NEUTROS PCT: 47 %
Platelets: 265 10*3/uL (ref 150–400)
RBC: 4.65 MIL/uL (ref 3.87–5.11)
RDW: 14.6 % (ref 11.5–15.5)
WBC: 5.3 10*3/uL (ref 4.0–10.5)

## 2016-06-11 NOTE — Progress Notes (Signed)
[place], DO [address] [place] [address]  Inflammatory carcinoma of right breast ([place]) - Plan: CBC with Differential, Comprehensive metabolic panel  Vitamin D deficiency - Plan: Vitamin D 25 hydroxy  Acute deep vein thrombosis (DVT) of left upper extremity, unspecified vein (HCC)  ALT (SGPT) level raised - Plan: Hepatic function panel  Elevated SGOT (AST) - Plan: Hepatic function panel  CURRENT THERAPY: Surveillance per NCCN guidelines  INTERVAL HISTORY: Wendy Gilbert 56 y.o. 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  Positive supraclavicular and infraclavicular lymph nodes identified.  Clinically, she had positive axillary nodes.  Mass was 12 x 11.5 cm with diffuse peau d'orange or changes of the right breast and markedly enlarged right breast.  Status post FEC 5 cycles with a switch to carboplatin and Taxotere 4 cycles.  She underwent bilateral mastectomies which were negative for disease.  She completed adjuvant radiation with Dr. Tammi Klippel in March 2011. AND History of DVT of left upper extremity and  status post 6 months of anticoagulation and removal of her Port-A-Cath.  No evidence of recurrence.  HPI Elements Breast cancer  Location: Right breast  Quality: Peau d'orange skin changes  Severity: Stage IIIC, 12 cm in size.  Duration: Diagnosed in 2010  Context:   Timing:   Modifying Factors: Right arm lymphedema, worse off of HCTZ  Associated Signs & Symptoms: Right breast enlargment.   She reports increase in extremity cramping.  This has let her primary care provider to decrease her Maxzide/HCTZ dose.  She reports that her HCTZ is now QOD.  She notes an improvement in her extremity cramping but subsequently has noted an increase in fluid retention with leg swelling.  She reports fatigue.  She notes that she is not sleeping well.  She reports an appetite of 75%.  Her weight is up 7 pounds  compared to September 2016.  This may be from fluid retention.  Her energy is rated at 50%.  She reports left shoulder pain.  She rates it as 6 out of 10.  She reports that this is chronic.  She reports that she has been told that it may be bursitis, but I suspect this is not the issue given the chronicity of this problem.  She notes a decrease in ROM of left shoulder, particularly in extension of shoulder. She describes the pain as ache.  Pain worse at night. Certain positions of shoulder make it worse.  She has a left arm lipoma.  She reports sores in her mouth, more than normal.  She notes it with acidic foods and hot foods (as in temperature); but reports that sometimes it happens without hot or acidic foods.  She uses [employer] toothpaste with additive whitening agent or mouthwash agent.  Review of Systems  Constitutional: Positive for malaise/fatigue. Negative for chills, fever and weight loss.  HENT: Negative.   Eyes: Negative.   Respiratory: Negative.  Negative for cough.   Cardiovascular: Positive for leg swelling. Negative for chest pain.  Gastrointestinal: Negative.  Negative for blood in stool, constipation, diarrhea, melena, nausea and vomiting.  Genitourinary: Negative.   Musculoskeletal: Positive for joint pain (left shoulder pain ).  Skin: Negative.   Neurological: Negative.  Negative for weakness.  Endo/Heme/Allergies: Negative.   Psychiatric/Behavioral: The patient has insomnia.     Past Medical History:  Diagnosis Date  . Arthritis    "all over"  .  DVT (deep venous thrombosis) ([facility]) 10/2008   left arm  . GERD (gastroesophageal reflux disease)   . Headache(784.0)    sinus  . 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 PONV (postoperative nausea and vomiting)   . Seasonal allergies   . Sinusitis   . Vitamin D deficiency 04/13/2014    Past Surgical History:  Procedure Laterality Date  . CESAREAN SECTION  09/1999  . DILATION AND CURETTAGE OF UTERUS  1978    . INCISION AND DRAINAGE ABSCESS  10/25/2008   and debridement left axillary abscess, abd. wall abscess, left thigh abscess  . KNEE ARTHROSCOPY Left 11/1996  . MASTECTOMY Bilateral 02/23/2009   right modified radical, left simple  . MASTECTOMY  2011    bilateral   . PORT-A-CATH REMOVAL Left 05/07/2012   Procedure: REMOVAL PORT-A-CATH;  Surgeon: Odis Hollingshead, MD;  Location: [facility];  Service: General;  Laterality: Left;  . PORTACATH PLACEMENT  08/09/2008  . TOTAL KNEE ARTHROPLASTY Left 08/10/2013   Procedure: LEFT TOTAL KNEE ARTHROPLASTY;  Surgeon: Tobi Bastos, MD;  Location: [facility];  Service: Orthopedics;  Laterality: Left;  . TUBAL LIGATION      Family History  Problem Relation Age of Onset  . Breast cancer Mother   . Cancer Mother     breast, colon, ovarian  . Heart disease Father   . Cancer Paternal Uncle     pancreatic    Social History   Social History  . Marital status: Married    Spouse name: N/A  . Number of children: N/A  . Years of education: N/A   Social History Main Topics  . Smoking status: Never Smoker  . Smokeless tobacco: Never Used  . Alcohol use No  . Drug use: No  . Sexual activity: Not Asked   Other Topics Concern  . None   Social History Narrative  . None     PHYSICAL EXAMINATION  ECOG PERFORMANCE STATUS: 0 - Asymptomatic  Vitals:   06/11/16 1356  BP: 137/80  Pulse: 93  Resp: 18  Temp: 98.5 F (36.9 C)    GENERAL:alert, no distress, well nourished, well developed, comfortable, cooperative, obese, smiling and unaccompanied SKIN: skin color, texture, turgor are normal, no rashes or significant lesions HEAD: Normocephalic, No masses, lesions, tenderness or abnormalities EYES: normal, EOMI, Conjunctiva are pink and non-injected EARS: External ears normal OROPHARYNX:lips, buccal mucosa, and tongue normal and mucous membranes are moist  NECK: supple, no adenopathy, thyroid normal size, non-tender, without  nodularity, trachea midline LYMPH:  no palpable lymphadenopathy, no hepatosplenomegaly BREAST:post-bilateral mastectomy sites well healed and free of suspicious changes LUNGS: clear to auscultation and percussion HEART: regular rate & rhythm, no murmurs, no gallops, S1 normal and S2 normal ABDOMEN:abdomen soft, non-tender, obese, normal bowel sounds and no masses or organomegaly BACK: Back symmetric, no curvature., No CVA tenderness EXTREMITIES:less then 2 second capillary refill, no joint deformities, effusion, or inflammation, no skin discoloration, no cyanosis, positive findings:  edema B/L LE 1+ pitting, Right arm lymphedema, and left anteromedial mass that is soft to palpation and located proximal to the antecubital region. NEURO: alert & oriented x 3 with fluent speech, no focal motor/sensory deficits, gait normal    LABORATORY DATA: CBC    Component Value Date/Time   WBC 5.3 06/11/2016 1256   RBC 4.65 06/11/2016 1256   HGB 13.0 06/11/2016 1256   HCT 39.5 06/11/2016 1256   PLT 265 06/11/2016 1256   MCV 84.9 06/11/2016 1256  MCH 28.0 06/11/2016 1256   MCHC 32.9 06/11/2016 1256   RDW 14.6 06/11/2016 1256   LYMPHSABS 2.1 06/11/2016 1256   MONOABS 0.5 06/11/2016 1256   EOSABS 0.2 06/11/2016 1256   BASOSABS 0.1 06/11/2016 1256      Chemistry      Component Value Date/Time   NA 139 06/11/2016 1256   K 4.0 06/11/2016 1256   CL 103 06/11/2016 1256   CO2 28 06/11/2016 1256   BUN 21 (H) 06/11/2016 1256   CREATININE 0.98 06/11/2016 1256      Component Value Date/Time   CALCIUM 9.3 06/11/2016 1256   ALKPHOS 70 06/11/2016 1256   AST 49 (H) 06/11/2016 1256   ALT 57 (H) 06/11/2016 1256   BILITOT 0.5 06/11/2016 1256        PENDING LABS:   RADIOGRAPHIC STUDIES:  No results found.   PATHOLOGY:    ASSESSMENT AND PLAN:  Inflammatory carcinoma of right breast Stage IIIc inflammatory carcinoma of the right breast, triple negative.  Positive supraclavicular and  infraclavicular lymph nodes identified.  Clinically, she had positive axillary nodes.  Mass was 12 x 11.5 cm with diffuse peau d'orange or changes of the right breast and markedly enlarged right breast.  Status post FEC 5 cycles with a switch to carboplatin and Taxotere 4 cycles.  She underwent bilateral mastectomies which were negative for disease.  She completed adjuvant radiation with Dr. Tammi Klippel in March 2011. NED.  Labs today: CBC diff, CMET, Vit D level.  I personally reviewed and went over laboratory results with the patient.  The results are noted within this dictation.  Blood counts are within normal limits. ALT/AST are minimally elevated but not 3x ULN.  We will repeat hepatic function in 4-6 weeks.  If results remain elevated, we will pursue ultrasound of liver to evaluate for fatty infiltration of liver.  No role for imaging since she had bilateral mastectomies.  Labs in 12 months: CBC diff, CMET, Vit D.  She will follow-up with her primary care provider regarding her HCTZ/fluid retention.  She will follow-up with PCP regarding her left shoulder pain.  She does have a left arm lipoma and if this becomes an issue for the patient, she can have it surgically removed.  She denies any changes in this lipoma over the past 2-3 years.  For her mouth sores, I have recommended a change in toothpaste to avoid whitening agents of other additives in her toothpaste.  Return in 12 months for annual follow-up.  She is 7 years out from completion of treatment.  Vitamin D deficiency History of vitamin D deficiency.  Labs today: Vit D Labs in 12 months: Vit D  DVT (deep venous thrombosis) (HCC) History of DVT of left upper extremity treated with anticoagulation 6 months and removal Port-A-Cath.  No evidence of recurrence.   Number of Diagnoses or Treatment Options- Section A:      Problems to Exam Physician Problem(s) Number x Points= Results  Self-limited or minor (stable, improved, or  worsening)  Max=2  1 2   Est. Problem (to examiner); stable, improved   1 2  Est. Problem (to examiner); worsening   2 2   New problem (to examiner); no additional work-up planned  Max=1 3   New problem (to examiner); add. work-up planned   4      Total: 6    Amount and/or Complexity of Data to be Reviewed- Section B    Data to be reviewed: Points  Review and/or order of clinical lab tests 1  [x]    Review and/or order of tests in the radiology section of CPT (includes nuclear med & other except cardiac cath & ECG) 1  []    Review and/or order of tests in the medicine section of CPT (e.g. EKG, cardiac cath, non-invasive vascular studies, pulmonary function studies) 1  []    Discussion of test results with performing physician 1  []    Decision to obtain old records and/or obtaining history from someone other than patient 1  []    Review and summarization of old records and/or obtaining history from someone other than patient and/or discussion of case with another health care provider 2  [x]    Independent visualization of image, tracing, or specimen itself (not simply review report) 2  []    Total:  3     Risk of  complications and/or Morbidity or Mortality- Section C  Level of Risk: Presenting Problem(s) Diagnostic Procedure(s) Ordered Management Options Selected  Minimal One self-limited or minor problem (eg cold, insect bite, tinea corporis)  Lab test requiring venipuncture  Chest xray  EKG/EEG  [place] or Echo  KOH prep  Urinalysis  Rest  Gargles  Elastic bandages  Superficial dressings  Low  Two or more self-limited or minor problems  One stable chronic illness (well-controlled HTN, non-insulin dependent diabetes, cataract, BPH)  Acute uncomplicated illness or injury (cystitis, allergic rhinitis, simple sprain)  Physiologic test not under stress (pulm fnx tests)  Non-cardiovascular imaging studies with contrast (barium enema)  Superficial needle biopsy  Clinical  laboratory tests requiring arterial puncture  Skin biopsies  OTC drugs  Minor surgery with no identified risk factors  Physical therapy  Occupational therapy  IV fluids without additives  Moderate  One or more chronic illnesses with mild exacerbation, progression, or side effects of treatment  Two or more stable chronic illnesses  Undiagnosed new problem with uncertain prognosis (lump in breast)  Acute illness with systemic symptoms (pyelonephritis, pneumonitis, colitis)  Acute complicated injury (head injury with brief loss of consciousness)  Physiologic test under stress (cardiac stress test, fetal contraction stress test)  Diagnostic endoscopies with no identified risk factors  Deep needle or incisional biopsy  Cardiovascular imaging studies with contrast and no identified risk factors (arteriogram, cardiac cath)  Obtain fluid from body cavity (LP, thoracentesis, culdocentesis)  Minor surgery with identified risk factors  Elective surgery (open, percutaneous, or endoscopic) with no identified risk factors  Prescription drug management  Therapeutic nuclear medicine  IV fluids with additives  Closed treatment of fracture of dislocation without manipulation  High  One or more chronic illnesses with severe exacerbation, progression, or side effects of treatment.  Acute or chronic illnesses or injuries that may pose a threat to life or bodily function (multiple trauma, acute MI, PE, severe respiratory distress, progressive severe rheumatoid arthritis, psychiatric illness with potential threat to self or others, peritonitis, acute renal failure)  An abrupt change in neurological status (seizure, TIA, weakness, sensory loss)  Cardiovascular imaging studies with contrast with identified risk factors  Cardiac electrophysiological tests  Diagnostic endoscopies with identified risk factors  Discography   Elective major surgery (open, percutaneous, or endoscopic) with  identified risk factor  Emergency major surgery (open, percutaneous, or endoscopic)  Parental controlled substances  Drug therapy requiring intensive monitoring for toxicity  Decision not to resuscitate or deescalate care because of poor prognosis     Final Result of Complexity      Choose decision making level with 2  or 3 checks OR choose the decision making level on Section B       A Number of diagnoses or treatment options  []   </= 1 Minimal  []   2 Limited  []   3 Multiple  [x]   >/= 4 Extensive  B Amount and complexity of data  []   </= 1 Minimal or low  []   2 Limited  [x]   3  Moderate  []   >/= 4 Extensive  C Highest risk  []   Minimal  []   Low  [x]   Moderate  []   High   Type of decision making  []   Straight-forward  []   Low Complexity  [x]   Moderate- Complexity  []   High- Complexity     ORDERS PLACED FOR THIS ENCOUNTER: Orders Placed This Encounter  Procedures  . CBC with Differential  . Comprehensive metabolic panel  . Vitamin D 25 hydroxy  . Hepatic function panel    MEDICATIONS PRESCRIBED THIS ENCOUNTER: Meds ordered this encounter  Medications  . Potassium Gluconate 595 MG CAPS    Sig: Take 2 capsules by mouth daily.  . diclofenac (VOLTAREN) 75 MG EC tablet    Sig: Take 75 mg by mouth daily.  Marland Kitchen lisinopril (PRINIVIL,ZESTRIL) 2.5 MG tablet    Sig: Take 2.5 mg by mouth daily.  . Vitamin D, Ergocalciferol, (DRISDOL) 50000 units CAPS capsule    Sig: Take 50,000 Units by mouth every 7 (seven) days.    THERAPY PLAN:  NCCN guidelines recommends the following surveillance for invasive breast cancer (1.2018):  A. History and Physical exam 1-4 times per year as clinically appropriate for 5 years, then annually.  B. Periodic screening for changes in family history and referral to genetics counseling as indicated  C. Educate, monitor, and refer to lymphedema management.  D. Mammography every 12 months  E. Routine imaging of reconstructed breast is not  indicated.  F. In the absence of clinical signs and symptoms suggestive of recurrent disease, there is no indication for laboratory or imaging studies for metastases screening.  G. Women on Tamoxifen: annual gynecologic assessment every 12 months if uterus is present.  H. Women on aromatase inhibitor or who experience ovarian failure secondary to treatment should have monitoring of bone health with a bone mineral density determination at baseline and periodically thereafter.  I. Assess and encourage adherence to adjuvant endocrine therapy.  J. Evidence suggests that active lifestyle, healthy diet, limited alcohol intake, and achieving and maintaining an ideal body weight (20-25 BMI) may lead to optimal breast cancer outcomes.   All questions were answered. The patient knows to call the clinic with any problems, questions or concerns. We can certainly see the patient much sooner if necessary.  Patient and plan discussed with Dr. Twana First and she is in agreement with the aforementioned.   This note is electronically signed by: Robynn Pane, PA-C 06/11/2016 2:22 PM

## 2016-06-11 NOTE — Patient Instructions (Addendum)
[place] at [facility] Discharge Instructions  RECOMMENDATIONS MADE BY THE CONSULTANT AND ANY TEST RESULTS WILL BE SENT TO YOUR REFERRING PHYSICIAN.  You were seen today by Kirby Crigler PA-C. Repeat labs in 4-6 weeks. Return in 12 months for labs and follow up.     Thank you for choosing [place] at [facility] to provide your oncology and hematology care.  To afford each patient quality time with our provider, please arrive at least 15 minutes before your scheduled appointment time.    If you have a lab appointment with the [facility] please come in thru the  Main Entrance and check in at the main information desk  You need to re-schedule your appointment should you arrive 10 or more minutes late.  We strive to give you quality time with our providers, and arriving late affects you and other patients whose appointments are after yours.  Also, if you no show three or more times for appointments you may be dismissed from the clinic at the providers discretion.     Again, thank you for choosing [facility].  Our hope is that these requests will decrease the amount of time that you wait before being seen by our physicians.       _____________________________________________________________  Should you have questions after your visit to [facility], please contact our office at [phone] between the hours of 8:30 a.m. and 4:30 p.m.  Voicemails left after 4:30 p.m. will not be returned until the following business day.  For prescription refill requests, have your pharmacy contact our office.       Resources For Cancer Patients and their Caregivers ? American Cancer Society: Can assist with transportation, wigs, general needs, runs Look Good Feel Better.        5034134406 ? Cancer Care: Provides financial assistance, online support groups, medication/co-pay assistance.  1-800-813-HOPE (272) 432-9756) ? [facility] Assists [place] cancer patients and their families through emotional , educational and financial support.  (978)224-7347 ? [place] DSS Where to apply for food stamps, Medicaid and utility assistance. 938-674-8468 ? [facility]: Transportation to medical appointments. (878) 489-9007 ? Social Security Administration: May apply for disability if have a Stage IV cancer. 928-006-3286 9527180602 ? [employer], Disability and Transit Services: Assists with nutrition, care and transit needs. [place] Support Programs: @10RELATIVEDAYS @ > Cancer Support Group  2nd Tuesday of the month 1pm-2pm, Journey Room  > Creative Journey  3rd Tuesday of the month 1130am-1pm, Journey Room  > Look Good Feel Better  1st Wednesday of the month 10am-12 noon, Journey Room (Call [place] to register [phone])

## 2016-06-11 NOTE — Assessment & Plan Note (Signed)
History of vitamin D deficiency.  Labs today: Vit D Labs in 12 months: Vit D

## 2016-06-11 NOTE — Assessment & Plan Note (Signed)
History of DVT of left upper extremity treated with anticoagulation 6 months and removal Port-A-Cath.  No evidence of recurrence.

## 2016-06-11 NOTE — Assessment & Plan Note (Addendum)
Stage IIIc inflammatory carcinoma of the right breast, triple negative.  Positive supraclavicular and infraclavicular lymph nodes identified.  Clinically, she had positive axillary nodes.  Mass was 12 x 11.5 cm with diffuse peau d'orange or changes of the right breast and markedly enlarged right breast.  Status post FEC 5 cycles with a switch to carboplatin and Taxotere 4 cycles.  She underwent bilateral mastectomies which were negative for disease.  She completed adjuvant radiation with Dr. Tammi Klippel in March 2011. NED.  Labs today: CBC diff, CMET, Vit D level.  I personally reviewed and went over laboratory results with the patient.  The results are noted within this dictation.  Blood counts are within normal limits. ALT/AST are minimally elevated but not 3x ULN.  We will repeat hepatic function in 4-6 weeks.  If results remain elevated, we will pursue ultrasound of liver to evaluate for fatty infiltration of liver.  No role for imaging since she had bilateral mastectomies.  Labs in 12 months: CBC diff, CMET, Vit D.  She will follow-up with her primary care provider regarding her HCTZ/fluid retention.  She will follow-up with PCP regarding her left shoulder pain.  She does have a left arm lipoma and if this becomes an issue for the patient, she can have it surgically removed.  She denies any changes in this lipoma over the past 2-3 years.  For her mouth sores, I have recommended a change in toothpaste to avoid whitening agents of other additives in her toothpaste.  Return in 12 months for annual follow-up.  She is 7 years out from completion of treatment.

## 2016-06-12 LAB — VITAMIN D 25 HYDROXY (VIT D DEFICIENCY, FRACTURES): Vit D, 25-Hydroxy: 70 ng/mL (ref 30.0–100.0)

## 2016-07-16 ENCOUNTER — Other Ambulatory Visit (HOSPITAL_COMMUNITY): Payer: Self-pay | Admitting: Oncology

## 2016-07-16 ENCOUNTER — Encounter (HOSPITAL_COMMUNITY): Payer: Medicare Other | Attending: Oncology

## 2016-07-16 DIAGNOSIS — R7401 Elevation of levels of liver transaminase levels: Secondary | ICD-10-CM

## 2016-07-16 DIAGNOSIS — R740 Nonspecific elevation of levels of transaminase and lactic acid dehydrogenase [LDH]: Secondary | ICD-10-CM | POA: Insufficient documentation

## 2016-07-16 LAB — HEPATIC FUNCTION PANEL
ALBUMIN: 3.9 g/dL (ref 3.5–5.0)
ALT: 60 U/L — ABNORMAL HIGH (ref 14–54)
AST: 61 U/L — AB (ref 15–41)
Alkaline Phosphatase: 75 U/L (ref 38–126)
BILIRUBIN DIRECT: 0.1 mg/dL (ref 0.1–0.5)
BILIRUBIN TOTAL: 0.6 mg/dL (ref 0.3–1.2)
Indirect Bilirubin: 0.5 mg/dL (ref 0.3–0.9)
Total Protein: 7.8 g/dL (ref 6.5–8.1)

## 2016-07-23 ENCOUNTER — Ambulatory Visit (HOSPITAL_COMMUNITY)
Admission: RE | Admit: 2016-07-23 | Discharge: 2016-07-23 | Disposition: A | Discharge status: Home / Self Care | Payer: Medicare Other | Source: Ambulatory Visit | Attending: Oncology | Admitting: Oncology

## 2016-07-23 DIAGNOSIS — R740 Nonspecific elevation of levels of transaminase and lactic acid dehydrogenase [LDH]: Secondary | ICD-10-CM | POA: Diagnosis not present

## 2016-07-23 DIAGNOSIS — K8020 Calculus of gallbladder without cholecystitis without obstruction: Secondary | ICD-10-CM | POA: Diagnosis not present

## 2016-07-23 DIAGNOSIS — R7401 Elevation of levels of liver transaminase levels: Secondary | ICD-10-CM

## 2017-06-11 ENCOUNTER — Inpatient Hospital Stay (HOSPITAL_COMMUNITY): Payer: Medicare Other

## 2017-06-11 ENCOUNTER — Other Ambulatory Visit: Payer: Self-pay

## 2017-06-11 ENCOUNTER — Encounter (HOSPITAL_COMMUNITY): Payer: Self-pay | Admitting: Hematology

## 2017-06-11 ENCOUNTER — Inpatient Hospital Stay (HOSPITAL_COMMUNITY): Payer: Medicare Other | Attending: Hematology | Admitting: Hematology

## 2017-06-11 VITALS — BP 138/80 | HR 107 | Temp 97.8°F | Resp 16 | Wt 217.5 lb

## 2017-06-11 DIAGNOSIS — C50911 Malignant neoplasm of unspecified site of right female breast: Secondary | ICD-10-CM

## 2017-06-11 DIAGNOSIS — G620 Drug-induced polyneuropathy: Secondary | ICD-10-CM

## 2017-06-11 DIAGNOSIS — C50411 Malignant neoplasm of upper-outer quadrant of right female breast: Secondary | ICD-10-CM | POA: Diagnosis not present

## 2017-06-11 DIAGNOSIS — Z79899 Other long term (current) drug therapy: Secondary | ICD-10-CM | POA: Insufficient documentation

## 2017-06-11 DIAGNOSIS — E559 Vitamin D deficiency, unspecified: Secondary | ICD-10-CM | POA: Diagnosis not present

## 2017-06-11 DIAGNOSIS — M25512 Pain in left shoulder: Secondary | ICD-10-CM | POA: Diagnosis not present

## 2017-06-11 DIAGNOSIS — G8929 Other chronic pain: Secondary | ICD-10-CM | POA: Insufficient documentation

## 2017-06-11 DIAGNOSIS — T451X5S Adverse effect of antineoplastic and immunosuppressive drugs, sequela: Secondary | ICD-10-CM | POA: Diagnosis not present

## 2017-06-11 DIAGNOSIS — Z86718 Personal history of other venous thrombosis and embolism: Secondary | ICD-10-CM | POA: Diagnosis not present

## 2017-06-11 DIAGNOSIS — Z171 Estrogen receptor negative status [ER-]: Secondary | ICD-10-CM | POA: Diagnosis not present

## 2017-06-11 DIAGNOSIS — Z9013 Acquired absence of bilateral breasts and nipples: Secondary | ICD-10-CM | POA: Diagnosis not present

## 2017-06-11 DIAGNOSIS — K760 Fatty (change of) liver, not elsewhere classified: Secondary | ICD-10-CM | POA: Insufficient documentation

## 2017-06-11 LAB — COMPREHENSIVE METABOLIC PANEL
ALK PHOS: 92 U/L (ref 38–126)
ALT: 35 U/L (ref 14–54)
AST: 35 U/L (ref 15–41)
Albumin: 4.1 g/dL (ref 3.5–5.0)
Anion gap: 11 (ref 5–15)
BILIRUBIN TOTAL: 0.4 mg/dL (ref 0.3–1.2)
BUN: 18 mg/dL (ref 6–20)
CALCIUM: 9.9 mg/dL (ref 8.9–10.3)
CO2: 27 mmol/L (ref 22–32)
CREATININE: 0.91 mg/dL (ref 0.44–1.00)
Chloride: 102 mmol/L (ref 101–111)
GFR calc Af Amer: >60 mL/min (ref >60)
Glucose, Bld: 164 mg/dL — ABNORMAL HIGH (ref 65–99)
Potassium: 3.8 mmol/L (ref 3.5–5.1)
Sodium: 140 mmol/L (ref 135–145)
TOTAL PROTEIN: 8.2 g/dL — AB (ref 6.5–8.1)

## 2017-06-11 LAB — CBC WITH DIFFERENTIAL/PLATELET
Basophils Absolute: 0.1 10*3/uL (ref 0.0–0.1)
Basophils Relative: 1 %
Eosinophils Absolute: 0.2 10*3/uL (ref 0.0–0.7)
Eosinophils Relative: 3 %
HEMATOCRIT: 42.2 % (ref 36.0–46.0)
HEMOGLOBIN: 13.2 g/dL (ref 12.0–15.0)
Lymphocytes Relative: 37 %
Lymphs Abs: 2.2 10*3/uL (ref 0.7–4.0)
MCH: 26.1 pg (ref 26.0–34.0)
MCHC: 31.3 g/dL (ref 30.0–36.0)
MCV: 83.6 fL (ref 78.0–100.0)
Monocytes Absolute: 0.5 10*3/uL (ref 0.1–1.0)
Monocytes Relative: 8 %
NEUTROS PCT: 51 %
Neutro Abs: 3.1 10*3/uL (ref 1.7–7.7)
Platelets: 325 10*3/uL (ref 150–400)
RBC: 5.05 MIL/uL (ref 3.87–5.11)
RDW: 14.5 % (ref 11.5–15.5)
WBC: 6 10*3/uL (ref 4.0–10.5)

## 2017-06-11 NOTE — Progress Notes (Signed)
Patient Care Team: [name], DO as PCP - General Tyler Pita, MD as Consulting Physician (Radiation Oncology)  DIAGNOSIS:  Encounter Diagnoses  Name Primary?  . Inflammatory carcinoma of right breast ([place]) Yes  . Vitamin D deficiency     SUMMARY OF ONCOLOGIC HISTORY:   Inflammatory carcinoma of right breast   08/01/2010 Initial Diagnosis    Inflammatory carcinoma of right breast       CHIEF COMPLIANT: Stage IIIC inflammatory carcinoma of breast; ER-/PR-/HER2-   INTERVAL HISTORY: Wendy Gilbert is a 57 y.o. female here for routine follow-up for h/o breast cancer.    Overall, she states that she has been feeling well.  Denies any new complaints. She has chronic (L) shoulder pain, which is longstanding and unchanged.  Reports occasional peripheral neuropathy, which has been present off and on since chemotherapy.    She is taking vitamin D OTC a few times per week. Her vitamin D level is pending for today.  She is concerned about her nails and new ridges that have been present for the past few months.    She was diagnosed with fatty liver disease on ultrasound in 07/2016; this was done as a result of increased LFTs.  Her blood work has been reviewed and has largely normalized.    No role for mammography given bilateral mastectomies. Denies any changes in her chest wall.      REVIEW OF SYSTEMS:   Constitutional: Denies fevers, chills or abnormal weight loss Eyes: Denies blurriness of vision Ears, nose, mouth, throat, and face: Denies mucositis or sore throat Respiratory: Denies cough, dyspnea or wheezes Cardiovascular: Denies palpitation, chest discomfort Gastrointestinal:  Denies nausea, heartburn or change in bowel habits Skin: Denies abnormal skin rashes Lymphatics: Denies new lymphadenopathy or easy bruising Neurological:Denies numbness, tingling or new weaknesses Behavioral/Psych: Mood is stable, no new changes  Extremities: No lower extremity edema Breast:  Nuys any pain at the mastectomy sites. All other systems were reviewed with the patient and are negative.  I have reviewed the past medical history, past surgical history, social history and family history with the patient and they are unchanged from previous note.  ALLERGIES:  is allergic to demerol; erythromycin; adhesive [tape]; cephalexin; and penicillins.  MEDICATIONS:  Current Outpatient Medications  Medication Sig Dispense Refill  . ibuprofen (ADVIL,MOTRIN) 200 MG tablet Take 200 mg by mouth every 6 (six) hours as needed.    . magnesium gluconate (MAGONATE) 500 MG tablet Take 500 mg by mouth daily.    Marland Kitchen omeprazole (PRILOSEC) 20 MG capsule Take 20 mg by mouth daily.      . Potassium Gluconate 595 MG CAPS Take 2 capsules by mouth daily.    Marland Kitchen triamterene-hydrochlorothiazide (DYAZIDE) 37.5-25 MG capsule Take 1 capsule by mouth daily.    . Vitamin D, Ergocalciferol, (DRISDOL) 50000 units CAPS capsule Take 50,000 Units by mouth every 7 (seven) days.     No current facility-administered medications for this visit.     PHYSICAL EXAMINATION: ECOG PERFORMANCE STATUS: 1 - Symptomatic but completely ambulatory  Vitals:   06/11/17 1431  BP: 138/80  Pulse: (!) 107  Resp: 16  Temp: 97.8 F (36.6 C)  SpO2: 99%   Filed Weights   06/11/17 1431  Weight: 217 lb 8 oz (98.7 kg)    GENERAL:alert, no distress and comfortable SKIN: skin color, texture, turgor are normal, no rashes or significant lesions EYES: normal, Conjunctiva are pink and non-injected, sclera clear OROPHARYNX:no mucositis, no erythema and lips, buccal mucosa,  and tongue normal  NECK: supple, thyroid normal size, non-tender, without nodularity LYMPH:  no palpable lymphadenopathy in the cervical, axillary or inguinal LUNGS: clear to auscultation and percussion with normal breathing effort HEART: regular rate & rhythm and no murmurs and no lower extremity edema ABDOMEN:abdomen soft, non-tender and normal bowel  sounds MUSCULOSKELETAL:no cyanosis of digits and no clubbing   EXTREMITIES: No lower extremity edema BREAST: Bilateral mastectomy sites are within normal limits.  LABORATORY DATA:  I have reviewed the data as listed CMP Latest Ref Rng & Units 06/11/2017 07/16/2016 06/11/2016  Glucose 65 - 99 mg/dL 164(H) - 117(H)  BUN 6 - 20 mg/dL 18 - 21(H)  Creatinine 0.44 - 1.00 mg/dL 0.91 - 0.98  Sodium 135 - 145 mmol/L 140 - 139  Potassium 3.5 - 5.1 mmol/L 3.8 - 4.0  Chloride 101 - 111 mmol/L 102 - 103  CO2 22 - 32 mmol/L 27 - 28  Calcium 8.9 - 10.3 mg/dL 9.9 - 9.3  Total Protein 6.5 - 8.1 g/dL 8.2(H) 7.8 7.6  Total Bilirubin 0.3 - 1.2 mg/dL 0.4 0.6 0.5  Alkaline Phos 38 - 126 U/L 92 75 70  AST 15 - 41 U/L 35 61(H) 49(H)  ALT 14 - 54 U/L 35 60(H) 57(H)   No results found for: EYC144   Lab Results  Component Value Date   WBC 6.0 06/11/2017   HGB 13.2 06/11/2017   HCT 42.2 06/11/2017   MCV 83.6 06/11/2017   PLT 325 06/11/2017   NEUTROABS 3.1 06/11/2017    ASSESSMENT & PLAN:  Inflammatory carcinoma of right breast 1.  Stage IIIc inflammatory right breast cancer, triple negative: - Presentation with positive supraclavicular, infraclavicular, axillary nodes with eroding mass in the upper outer quadrant of the right breast, 12 x 11.5 cm, status post FEC x5 cycles, followed by carboplatin and Taxotere for 4 cycles, followed by bilateral mastectomies, Y PT 0, ypN0 -Completed radiation therapy in March 2011 - Bilateral mastectomy areas on examination today did not show any evidence of suspicious masses.  Comprehensive metabolic panel is within normal limits.  She will come back in 1 year for follow-up with physical exam and labs.  2.  Left upper extremity DVT: -Port induced.  She underwent 6 months of anticoagulation.  Port is removed.  3.  Fatty liver: Ultrasound last year showed fatty liver.  She had elevation of LFTs at that time.  Today LFTs have come down to normal.      Breast Cancer  therapy associated bone loss: I have recommended calcium, Vitamin D and weight bearing exercises.   Orders Placed This Encounter  Procedures  . Comprehensive metabolic panel    Standing Status:   Future    Standing Expiration Date:   06/11/2018    Order Specific Question:   Has the patient fasted?    Answer:   No  . CBC with Differential/Platelet    Standing Status:   Future    Standing Expiration Date:   06/11/2018  . VITAMIN D 25 Hydroxy (Vit-D Deficiency, Fractures)    Standing Status:   Future    Standing Expiration Date:   06/11/2018   The patient has a good understanding of the overall plan. she agrees with it. she will call with any problems that may develop before the next visit here.   This note includes documentation from Mike Craze, NP, who was present during this patient's office visit and evaluation.  I have reviewed this note for its completeness and accuracy.  I have edited this note accordingly based on my findings and medical opinion.      Derek Jack, MD 06/11/17

## 2017-06-11 NOTE — Assessment & Plan Note (Signed)
1.  Stage IIIc inflammatory right breast cancer, triple negative: - Presentation with positive supraclavicular, infraclavicular, axillary nodes with eroding mass in the upper outer quadrant of the right breast, 12 x 11.5 cm, status post FEC x5 cycles, followed by carboplatin and Taxotere for 4 cycles, followed by bilateral mastectomies, Y PT 0, ypN0 -Completed radiation therapy in March 2011 - Bilateral mastectomy areas on examination today did not show any evidence of suspicious masses.  Comprehensive metabolic panel is within normal limits.  She will come back in 1 year for follow-up with physical exam and labs.  2.  Left upper extremity DVT: -Port induced.  She underwent 6 months of anticoagulation.  Port is removed.  3.  Fatty liver: Ultrasound last year showed fatty liver.  She had elevation of LFTs at that time.  Today LFTs have come down to normal.

## 2017-06-12 LAB — VITAMIN D 25 HYDROXY (VIT D DEFICIENCY, FRACTURES): Vit D, 25-Hydroxy: 36.6 ng/mL (ref 30.0–100.0)

## 2017-08-07 ENCOUNTER — Other Ambulatory Visit (HOSPITAL_COMMUNITY)
Admission: RE | Admit: 2017-08-07 | Discharge: 2017-08-07 | Disposition: A | Discharge status: Home / Self Care | Payer: Medicare Other | Source: Ambulatory Visit | Attending: *Deleted | Admitting: *Deleted

## 2017-08-07 DIAGNOSIS — R5383 Other fatigue: Secondary | ICD-10-CM | POA: Insufficient documentation

## 2017-08-07 DIAGNOSIS — E538 Deficiency of other specified B group vitamins: Secondary | ICD-10-CM | POA: Diagnosis present

## 2017-08-07 DIAGNOSIS — I10 Essential (primary) hypertension: Secondary | ICD-10-CM | POA: Insufficient documentation

## 2017-08-07 LAB — CBC WITH DIFFERENTIAL/PLATELET
Basophils Absolute: 0.1 10*3/uL (ref 0.0–0.1)
Basophils Relative: 1 %
Eosinophils Absolute: 0.2 10*3/uL (ref 0.0–0.7)
Eosinophils Relative: 3 %
HCT: 41.2 % (ref 36.0–46.0)
Hemoglobin: 13.1 g/dL (ref 12.0–15.0)
Lymphocytes Relative: 35 %
Lymphs Abs: 2.5 10*3/uL (ref 0.7–4.0)
MCH: 26.4 pg (ref 26.0–34.0)
MCHC: 31.8 g/dL (ref 30.0–36.0)
MCV: 83.1 fL (ref 78.0–100.0)
Monocytes Absolute: 0.5 10*3/uL (ref 0.1–1.0)
Monocytes Relative: 8 %
Neutro Abs: 3.8 10*3/uL (ref 1.7–7.7)
Neutrophils Relative %: 53 %
Platelets: 324 10*3/uL (ref 150–400)
RBC: 4.96 MIL/uL (ref 3.87–5.11)
RDW: 14.1 % (ref 11.5–15.5)
WBC: 7.1 10*3/uL (ref 4.0–10.5)

## 2017-08-07 LAB — COMPREHENSIVE METABOLIC PANEL
ALT: 39 U/L (ref 0–44)
AST: 43 U/L — ABNORMAL HIGH (ref 15–41)
Albumin: 4 g/dL (ref 3.5–5.0)
Alkaline Phosphatase: 78 U/L (ref 38–126)
Anion gap: 10 (ref 5–15)
BUN: 19 mg/dL (ref 6–20)
CHLORIDE: 104 mmol/L (ref 98–111)
CO2: 24 mmol/L (ref 22–32)
Calcium: 9.4 mg/dL (ref 8.9–10.3)
Creatinine, Ser: 0.83 mg/dL (ref 0.44–1.00)
GFR calc Af Amer: >60 mL/min (ref >60)
GFR calc non Af Amer: >60 mL/min (ref >60)
Glucose, Bld: 158 mg/dL — ABNORMAL HIGH (ref 70–99)
Potassium: 3.9 mmol/L (ref 3.5–5.1)
Sodium: 138 mmol/L (ref 135–145)
Total Bilirubin: 0.4 mg/dL (ref 0.3–1.2)
Total Protein: 8.1 g/dL (ref 6.5–8.1)

## 2017-08-07 LAB — TSH: TSH: 2.739 u[IU]/mL (ref 0.350–4.500)

## 2017-08-07 LAB — LIPID PANEL
CHOLESTEROL: 182 mg/dL (ref 0–200)
HDL: 48 mg/dL (ref >40)
LDL CALC: 113 mg/dL — AB (ref 0–99)
TRIGLYCERIDES: 103 mg/dL (ref <150)
Total CHOL/HDL Ratio: 3.8 RATIO
VLDL: 21 mg/dL (ref 0–40)

## 2017-08-07 LAB — VITAMIN B12: Vitamin B-12: 410 pg/mL (ref 180–914)

## 2017-08-08 LAB — VITAMIN D 25 HYDROXY (VIT D DEFICIENCY, FRACTURES): Vit D, 25-Hydroxy: 36.7 ng/mL (ref 30.0–100.0)

## 2018-06-11 ENCOUNTER — Other Ambulatory Visit (HOSPITAL_COMMUNITY): Payer: Self-pay | Admitting: *Deleted

## 2018-06-11 DIAGNOSIS — E559 Vitamin D deficiency, unspecified: Secondary | ICD-10-CM

## 2018-06-11 DIAGNOSIS — C50911 Malignant neoplasm of unspecified site of right female breast: Secondary | ICD-10-CM

## 2018-06-12 ENCOUNTER — Inpatient Hospital Stay (HOSPITAL_COMMUNITY): Payer: Medicare Other | Attending: Hematology

## 2018-06-12 ENCOUNTER — Other Ambulatory Visit: Payer: Self-pay

## 2018-06-12 DIAGNOSIS — M1990 Unspecified osteoarthritis, unspecified site: Secondary | ICD-10-CM | POA: Diagnosis not present

## 2018-06-12 DIAGNOSIS — Z803 Family history of malignant neoplasm of breast: Secondary | ICD-10-CM | POA: Insufficient documentation

## 2018-06-12 DIAGNOSIS — Z9013 Acquired absence of bilateral breasts and nipples: Secondary | ICD-10-CM | POA: Diagnosis not present

## 2018-06-12 DIAGNOSIS — R600 Localized edema: Secondary | ICD-10-CM | POA: Insufficient documentation

## 2018-06-12 DIAGNOSIS — R110 Nausea: Secondary | ICD-10-CM | POA: Insufficient documentation

## 2018-06-12 DIAGNOSIS — K219 Gastro-esophageal reflux disease without esophagitis: Secondary | ICD-10-CM | POA: Insufficient documentation

## 2018-06-12 DIAGNOSIS — Z171 Estrogen receptor negative status [ER-]: Secondary | ICD-10-CM | POA: Diagnosis not present

## 2018-06-12 DIAGNOSIS — R42 Dizziness and giddiness: Secondary | ICD-10-CM | POA: Insufficient documentation

## 2018-06-12 DIAGNOSIS — C50411 Malignant neoplasm of upper-outer quadrant of right female breast: Secondary | ICD-10-CM | POA: Diagnosis present

## 2018-06-12 DIAGNOSIS — Z923 Personal history of irradiation: Secondary | ICD-10-CM | POA: Diagnosis not present

## 2018-06-12 DIAGNOSIS — Z8041 Family history of malignant neoplasm of ovary: Secondary | ICD-10-CM | POA: Diagnosis not present

## 2018-06-12 DIAGNOSIS — R200 Anesthesia of skin: Secondary | ICD-10-CM | POA: Diagnosis not present

## 2018-06-12 DIAGNOSIS — C50911 Malignant neoplasm of unspecified site of right female breast: Secondary | ICD-10-CM

## 2018-06-12 DIAGNOSIS — K760 Fatty (change of) liver, not elsewhere classified: Secondary | ICD-10-CM | POA: Insufficient documentation

## 2018-06-12 DIAGNOSIS — E559 Vitamin D deficiency, unspecified: Secondary | ICD-10-CM | POA: Diagnosis not present

## 2018-06-12 DIAGNOSIS — Z800 Family history of malignant neoplasm of digestive organs: Secondary | ICD-10-CM | POA: Diagnosis not present

## 2018-06-12 DIAGNOSIS — Z79899 Other long term (current) drug therapy: Secondary | ICD-10-CM | POA: Diagnosis not present

## 2018-06-12 DIAGNOSIS — Z86718 Personal history of other venous thrombosis and embolism: Secondary | ICD-10-CM | POA: Insufficient documentation

## 2018-06-12 LAB — CBC WITH DIFFERENTIAL/PLATELET
Abs Immature Granulocytes: 0.02 10*3/uL (ref 0.00–0.07)
Basophils Absolute: 0 10*3/uL (ref 0.0–0.1)
Basophils Relative: 1 %
Eosinophils Absolute: 0.2 10*3/uL (ref 0.0–0.5)
Eosinophils Relative: 2 %
HCT: 42.6 % (ref 36.0–46.0)
Hemoglobin: 13.2 g/dL (ref 12.0–15.0)
Immature Granulocytes: 0 %
Lymphocytes Relative: 34 %
Lymphs Abs: 2.2 10*3/uL (ref 0.7–4.0)
MCH: 26.9 pg (ref 26.0–34.0)
MCHC: 31 g/dL (ref 30.0–36.0)
MCV: 86.9 fL (ref 80.0–100.0)
Monocytes Absolute: 0.6 10*3/uL (ref 0.1–1.0)
Monocytes Relative: 9 %
Neutro Abs: 3.6 10*3/uL (ref 1.7–7.7)
Neutrophils Relative %: 54 %
Platelets: 251 10*3/uL (ref 150–400)
RBC: 4.9 MIL/uL (ref 3.87–5.11)
RDW: 14.2 % (ref 11.5–15.5)
WBC: 6.6 10*3/uL (ref 4.0–10.5)
nRBC: 0 % (ref 0.0–0.2)

## 2018-06-12 LAB — COMPREHENSIVE METABOLIC PANEL
ALT: 28 U/L (ref 0–44)
AST: 28 U/L (ref 15–41)
Albumin: 3.7 g/dL (ref 3.5–5.0)
Alkaline Phosphatase: 68 U/L (ref 38–126)
Anion gap: 10 (ref 5–15)
BUN: 19 mg/dL (ref 6–20)
CO2: 28 mmol/L (ref 22–32)
Calcium: 9.1 mg/dL (ref 8.9–10.3)
Chloride: 102 mmol/L (ref 98–111)
Creatinine, Ser: 0.8 mg/dL (ref 0.44–1.00)
GFR calc Af Amer: >60 mL/min (ref >60)
GFR calc non Af Amer: >60 mL/min (ref >60)
Glucose, Bld: 129 mg/dL — ABNORMAL HIGH (ref 70–99)
Potassium: 4.1 mmol/L (ref 3.5–5.1)
Sodium: 140 mmol/L (ref 135–145)
Total Bilirubin: 0.6 mg/dL (ref 0.3–1.2)
Total Protein: 7.7 g/dL (ref 6.5–8.1)

## 2018-06-13 LAB — VITAMIN D 25 HYDROXY (VIT D DEFICIENCY, FRACTURES): Vit D, 25-Hydroxy: 28.8 ng/mL — ABNORMAL LOW (ref 30.0–100.0)

## 2018-06-19 ENCOUNTER — Encounter (HOSPITAL_COMMUNITY): Payer: Self-pay | Admitting: Hematology

## 2018-06-19 ENCOUNTER — Other Ambulatory Visit: Payer: Self-pay

## 2018-06-19 ENCOUNTER — Inpatient Hospital Stay (HOSPITAL_BASED_OUTPATIENT_CLINIC_OR_DEPARTMENT_OTHER): Payer: Medicare Other | Admitting: Hematology

## 2018-06-19 VITALS — BP 139/98 | HR 96 | Temp 98.3°F | Resp 18 | Wt 211.8 lb

## 2018-06-19 DIAGNOSIS — Z9013 Acquired absence of bilateral breasts and nipples: Secondary | ICD-10-CM

## 2018-06-19 DIAGNOSIS — E559 Vitamin D deficiency, unspecified: Secondary | ICD-10-CM

## 2018-06-19 DIAGNOSIS — Z171 Estrogen receptor negative status [ER-]: Secondary | ICD-10-CM | POA: Diagnosis not present

## 2018-06-19 DIAGNOSIS — R42 Dizziness and giddiness: Secondary | ICD-10-CM

## 2018-06-19 DIAGNOSIS — Z800 Family history of malignant neoplasm of digestive organs: Secondary | ICD-10-CM

## 2018-06-19 DIAGNOSIS — Z8041 Family history of malignant neoplasm of ovary: Secondary | ICD-10-CM

## 2018-06-19 DIAGNOSIS — Z923 Personal history of irradiation: Secondary | ICD-10-CM | POA: Diagnosis not present

## 2018-06-19 DIAGNOSIS — K760 Fatty (change of) liver, not elsewhere classified: Secondary | ICD-10-CM

## 2018-06-19 DIAGNOSIS — C50911 Malignant neoplasm of unspecified site of right female breast: Secondary | ICD-10-CM

## 2018-06-19 DIAGNOSIS — R110 Nausea: Secondary | ICD-10-CM

## 2018-06-19 DIAGNOSIS — M1990 Unspecified osteoarthritis, unspecified site: Secondary | ICD-10-CM

## 2018-06-19 DIAGNOSIS — R200 Anesthesia of skin: Secondary | ICD-10-CM

## 2018-06-19 DIAGNOSIS — C50411 Malignant neoplasm of upper-outer quadrant of right female breast: Secondary | ICD-10-CM

## 2018-06-19 DIAGNOSIS — Z86718 Personal history of other venous thrombosis and embolism: Secondary | ICD-10-CM

## 2018-06-19 DIAGNOSIS — Z803 Family history of malignant neoplasm of breast: Secondary | ICD-10-CM

## 2018-06-19 DIAGNOSIS — Z79899 Other long term (current) drug therapy: Secondary | ICD-10-CM

## 2018-06-19 DIAGNOSIS — K219 Gastro-esophageal reflux disease without esophagitis: Secondary | ICD-10-CM

## 2018-06-19 DIAGNOSIS — R600 Localized edema: Secondary | ICD-10-CM

## 2018-06-19 NOTE — Assessment & Plan Note (Addendum)
1.  Stage IIIc inflammatory right breast cancer, triple negative: - Presentation with positive supraclavicular, infraclavicular, axillary nodes with eroding mass in the upper outer quadrant of the right breast, 12 x 11.5 cm, status post FEC x5 cycles, followed by carboplatin and Taxotere for 4 cycles, followed by bilateral mastectomies, Y PT 0, ypN0 on 02/23/2009. -Completed radiation therapy in March 2011 - Bilateral mastectomy sites today are within normal limits.  No palpable adenopathy. -We reviewed her blood work.  She had mildly low vitamin D level at 28.  I have suggested her to take 5000 units of vitamin D daily.  Will check it at next visit in 1 year.  2.  Left upper extremity DVT: -Port induced.  She underwent 6 months of anticoagulation.  Port is removed.  3.  Fatty liver: Previous ultrasound showed fatty liver. -LFTs are normal at this time.

## 2018-06-19 NOTE — Patient Instructions (Addendum)
[facility] at [facility] Discharge Instructions  You were seen today by Dr. Katragadda. He went over your recent lab results. He will see you back in 1 year for labs and follow up.   Thank you for choosing [facility] at [facility] to provide your oncology and hematology care.  To afford each patient quality time with our provider, please arrive at least 15 minutes before your scheduled appointment time.   If you have a lab appointment with the Cancer Center please come in thru the  Main Entrance and check in at the main information desk  You need to re-schedule your appointment should you arrive 10 or more minutes late.  We strive to give you quality time with our providers, and arriving late affects you and other patients whose appointments are after yours.  Also, if you no show three or more times for appointments you may be dismissed from the clinic at the providers discretion.     Again, thank you for choosing [facility].  Our hope is that these requests will decrease the amount of time that you wait before being seen by our physicians.       _____________________________________________________________  Should you have questions after your visit to [facility], please contact our office at [phone] between the hours of 8:00 a.m. and 4:30 p.m.  Voicemails left after 4:00 p.m. will not be returned until the following business day.  For prescription refill requests, have your pharmacy contact our office and allow 72 hours.    Cancer Center Support Programs:   > Cancer Support Group  2nd Tuesday of the month 1pm-2pm, Journey Room    

## 2018-06-19 NOTE — Progress Notes (Signed)
Wendy Gilbert, Wendy Gilbert [id number]   CLINIC:  Medical Oncology/Hematology  PCP:  [name], DO [address] [place] [address] [phone]   REASON FOR VISIT:  Follow-up for Inflammatory carcinoma of right breast,   Vitamin D deficiency  BRIEF ONCOLOGIC HISTORY:    Inflammatory carcinoma of right breast   08/01/2010 Initial Diagnosis    Inflammatory carcinoma of right breast      CANCER STAGING: Cancer Staging Inflammatory carcinoma of right breast Staging form: Breast, AJCC 7th Edition - Clinical: Stage IIIC (T4d, N3c, cM0) - Signed by [facility]aird Cancer, PA on 08/01/2010    INTERVAL HISTORY:  Wendy Gilbert 58 y.o. female returns for routine follow-up. She is here today alone. She states that she has been doing well since her last visit. Denies any vomiting, or diarrhea. Denies any new pains. Had not noticed any recent bleeding such as epistaxis, hematuria or hematochezia. Denies recent chest pain on exertion, shortness of breath on minimal exertion, pre-syncopal episodes, or palpitations.  Denies any recent fevers, infections, or recent hospitalizations. Patient reports appetite at 100% and energy level at 50%.  REVIEW OF SYSTEMS:  Review of Systems  Cardiovascular: Positive for leg swelling.  Gastrointestinal: Positive for nausea.  Neurological: Positive for dizziness and numbness.     PAST MEDICAL/SURGICAL HISTORY:  Past Medical History:  Diagnosis Date  . Arthritis    "all over"  . DVT (deep venous thrombosis) ([place]) 10/2008   left arm  . GERD (gastroesophageal reflux disease)   . Headache(784.0)    sinus  . 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 PONV (postoperative nausea and vomiting)   . Seasonal allergies   . Sinusitis   . Vitamin D deficiency 04/13/2014   Past Surgical History:  Procedure Laterality Date  . CESAREAN SECTION  09/1999  . DILATION AND CURETTAGE OF UTERUS  1978  . INCISION AND  DRAINAGE ABSCESS  10/25/2008   and debridement left axillary abscess, abd. wall abscess, left thigh abscess  . KNEE ARTHROSCOPY Left 11/1996  . MASTECTOMY Bilateral 02/23/2009   right modified radical, left simple  . MASTECTOMY  2011    bilateral   . PORT-A-CATH REMOVAL Left 05/07/2012   Procedure: REMOVAL PORT-A-CATH;  Surgeon: Odis Hollingshead, MD;  Location: [facility];  Service: General;  Laterality: Left;  . PORTACATH PLACEMENT  08/09/2008  . TOTAL KNEE ARTHROPLASTY Left 08/10/2013   Procedure: LEFT TOTAL KNEE ARTHROPLASTY;  Surgeon: Tobi Bastos, MD;  Location: [facility];  Service: Orthopedics;  Laterality: Left;  . TUBAL LIGATION       SOCIAL HISTORY:  Social History   Socioeconomic History  . Marital status: Married    Spouse name: Not on file  . Number of children: Not on file  . Years of education: Not on file  . Highest education level: Not on file  Occupational History  . Not on file  Social Needs  . Financial resource strain: Not on file  . Food insecurity:    Worry: Not on file    Inability: Not on file  . Transportation needs:    Medical: Not on file    Non-medical: Not on file  Tobacco Use  . Smoking status: Never Smoker  . Smokeless tobacco: Never Used  Substance and Sexual Activity  . Alcohol use: No  . Drug use: No  . Sexual activity: Not on file  Lifestyle  . Physical activity:  Days per week: Not on file    Minutes per session: Not on file  . Stress: Not on file  Relationships  . Social connections:    Talks on phone: Not on file    Gets together: Not on file    Attends religious service: Not on file    Active member of club or organization: Not on file    Attends meetings of clubs or organizations: Not on file    Relationship status: Not on file  . Intimate partner violence:    Fear of current or ex partner: Not on file    Emotionally abused: Not on file    Physically abused: Not on file    Forced sexual activity: Not on  file  Other Topics Concern  . Not on file  Social History Narrative  . Not on file    FAMILY HISTORY:  Family History  Problem Relation Age of Onset  . Breast cancer Mother   . Cancer Mother        breast, colon, ovarian  . Heart disease Father   . Cancer Paternal Uncle        pancreatic    CURRENT MEDICATIONS:  Outpatient Encounter Medications as of 06/19/2018  Medication Sig  . ibuprofen (ADVIL,MOTRIN) 200 MG tablet Take 200 mg by mouth every 6 (six) hours as needed.  . magnesium gluconate (MAGONATE) 500 MG tablet Take 500 mg by mouth daily.  Marland Kitchen omeprazole (PRILOSEC) 20 MG capsule Take 20 mg by mouth daily.    . Potassium Gluconate 595 MG CAPS Take 2 capsules by mouth daily.  Marland Kitchen triamterene-hydrochlorothiazide (DYAZIDE) 37.5-25 MG capsule Take 1 capsule by mouth daily.  . Vitamin D, Ergocalciferol, (DRISDOL) 50000 units CAPS capsule Take 50,000 Units by mouth every 7 (seven) days.   No facility-administered encounter medications on file as of 06/19/2018.     ALLERGIES:  Allergies  Allergen Reactions  . Demerol Other (See Comments)    MENTAL STATUS CHANGE  . Erythromycin Nausea And Vomiting  . Adhesive [Tape] Rash  . Cephalexin Hives and Other (See Comments)    ABD. PAIN  . Penicillins Other (See Comments)    UNKNOWN - WAS AS A CHILD     PHYSICAL EXAM:  ECOG Performance status: 1  Vitals:   06/19/18 1502  BP: (!) 139/98  Pulse: 96  Resp: 18  Temp: 98.3 F (36.8 C)  SpO2: 97%   Filed Weights   06/19/18 1502  Weight: 211 lb 12.8 oz (96.1 kg)    Physical Exam Vitals signs reviewed.  Constitutional:      Appearance: Normal appearance.  Cardiovascular:     Rate and Rhythm: Normal rate and regular rhythm.     Heart sounds: Normal heart sounds.  Pulmonary:     Effort: Pulmonary effort is normal.     Breath sounds: Normal breath sounds.  Abdominal:     General: There is no distension.     Palpations: Abdomen is soft. There is no mass.  Musculoskeletal:         General: No swelling.  Skin:    General: Skin is warm.  Neurological:     General: No focal deficit present.     Mental Status: She is alert and oriented to person, place, and time.  Psychiatric:        Mood and Affect: Mood normal.        Behavior: Behavior normal.   Bilateral mastectomy sites are within normal limits.  No palpable adenopathy.  LABORATORY DATA:  I have reviewed the labs as listed.  CBC    Component Value Date/Time   WBC 6.6 06/12/2018 1202   RBC 4.90 06/12/2018 1202   HGB 13.2 06/12/2018 1202   HCT 42.6 06/12/2018 1202   PLT 251 06/12/2018 1202   MCV 86.9 06/12/2018 1202   MCH 26.9 06/12/2018 1202   MCHC 31.0 06/12/2018 1202   RDW 14.2 06/12/2018 1202   LYMPHSABS 2.2 06/12/2018 1202   MONOABS 0.6 06/12/2018 1202   EOSABS 0.2 06/12/2018 1202   BASOSABS 0.0 06/12/2018 1202   CMP Latest Ref Rng & Units 06/12/2018 08/07/2017 06/11/2017  Glucose 70 - 99 mg/dL 129(H) 158(H) 164(H)  BUN 6 - 20 mg/dL 19 19 18   Creatinine 0.44 - 1.00 mg/dL 0.80 0.83 0.91  Sodium 135 - 145 mmol/L 140 138 140  Potassium 3.5 - 5.1 mmol/L 4.1 3.9 3.8  Chloride 98 - 111 mmol/L 102 104 102  CO2 22 - 32 mmol/L 28 24 27   Calcium 8.9 - 10.3 mg/dL 9.1 9.4 9.9  Total Protein 6.5 - 8.1 g/dL 7.7 8.1 8.2(H)  Total Bilirubin 0.3 - 1.2 mg/dL 0.6 0.4 0.4  Alkaline Phos 38 - 126 U/L 68 78 92  AST 15 - 41 U/L 28 43(H) 35  ALT 0 - 44 U/L 28 39 35       DIAGNOSTIC IMAGING:  I have independently reviewed the scans and discussed with the patient.   I have reviewed Venita Lick LPN's note and agree with the documentation.  I personally performed a face-to-face visit, made revisions and my assessment and plan is as follows.    ASSESSMENT & PLAN:   Inflammatory carcinoma of right breast 1.  Stage IIIc inflammatory right breast cancer, triple negative: - Presentation with positive supraclavicular, infraclavicular, axillary nodes with eroding mass in the upper outer quadrant of the right  breast, 12 x 11.5 cm, status post FEC x5 cycles, followed by carboplatin and Taxotere for 4 cycles, followed by bilateral mastectomies, Y PT 0, ypN0 on 02/23/2009. -Completed radiation therapy in March 2011 - Bilateral mastectomy sites today are within normal limits.  No palpable adenopathy. -We reviewed her blood work.  She had mildly low vitamin D level at 28.  I have suggested her to take 5000 units of vitamin D daily.  Will check it at next visit in 1 year.  2.  Left upper extremity DVT: -Port induced.  She underwent 6 months of anticoagulation.  Port is removed.  3.  Fatty liver: Previous ultrasound showed fatty liver. -LFTs are normal at this time.      Orders placed this encounter:  Orders Placed This Encounter  Procedures  . CBC with Differential/Platelet  . Comprehensive metabolic panel  . Vitamin D 25 hydroxy      Derek Jack, MD [facility] [phone]

## 2019-04-26 ENCOUNTER — Ambulatory Visit (INDEPENDENT_AMBULATORY_CARE_PROVIDER_SITE_OTHER): Payer: Medicare Other | Admitting: Family Medicine

## 2019-04-26 ENCOUNTER — Other Ambulatory Visit: Payer: Self-pay

## 2019-04-26 ENCOUNTER — Encounter: Payer: Self-pay | Admitting: Family Medicine

## 2019-04-26 VITALS — BP 141/101 | HR 89 | Temp 98.6°F | Ht 64.0 in | Wt 210.8 lb

## 2019-04-26 DIAGNOSIS — Z1211 Encounter for screening for malignant neoplasm of colon: Secondary | ICD-10-CM

## 2019-04-26 DIAGNOSIS — K219 Gastro-esophageal reflux disease without esophagitis: Secondary | ICD-10-CM | POA: Diagnosis not present

## 2019-04-26 DIAGNOSIS — M62830 Muscle spasm of back: Secondary | ICD-10-CM | POA: Diagnosis not present

## 2019-04-26 DIAGNOSIS — Z7689 Persons encountering health services in other specified circumstances: Secondary | ICD-10-CM

## 2019-04-26 DIAGNOSIS — I10 Essential (primary) hypertension: Secondary | ICD-10-CM | POA: Diagnosis not present

## 2019-04-26 DIAGNOSIS — Z853 Personal history of malignant neoplasm of breast: Secondary | ICD-10-CM | POA: Diagnosis not present

## 2019-04-26 DIAGNOSIS — I152 Hypertension secondary to endocrine disorders: Secondary | ICD-10-CM | POA: Insufficient documentation

## 2019-04-26 MED ORDER — PANTOPRAZOLE SODIUM 40 MG PO TBEC
40.0000 mg | DELAYED_RELEASE_TABLET | Freq: Every day | ORAL | 1 refills | Status: DC
Start: 2019-04-26 — End: 2019-10-04

## 2019-04-26 MED ORDER — TRIAMTERENE-HCTZ 75-50 MG PO TABS: 1.0000 | ORAL_TABLET | Freq: Every day | ORAL | 1 refills | Status: DC

## 2019-04-26 NOTE — Progress Notes (Signed)
Subjective: ON:GEXBMWUXL care, allergies/difficulty breathing through nose, history of breast cancer, GERD HPI: Wendy Gilbert is a 59 y.o. female presenting to clinic today for:  1.  Nose concern Patient reports worsening difficulty breathing out of the right side of her nose.  She has no problem breathing outside the left but is totally unable to breathe out of the right.  She is used Flonase in the past but cannot recall using it recently and is unsure if this would be helpful.  She sometimes feels that the right side of her face appears fuller than the left side.  She also feels that there is a fullness on the right compared to the left.  She is not seeing an ear nose and throat doctor for this but would be very interested in seeing 1.  2.  GERD/history of breast cancer Patient reports uncontrolled GERD with omeprazole 20 mg daily.  She has a hiatal hernia.  She is wondering if we could perhaps change her to Protonix, which works well for her daughter.  Denies history of GI bleed, ulcer.  No nausea, vomiting, hematochezia or melena.  She has not had a colonoscopy but would like to get one.  She has history of breast cancer status post double mastectomy in 2011.  3.  Hypertension Patient reports longstanding history of hypertension.  Blood pressures have been well controlled previously with Dyazide 75-50 mg daily.  No chest pain, shortness of breath, edema, dizziness, headache, visual disturbance.  She does not routinely check blood pressures at home.  4.  Low back pain Patient reports intermittent low back spasm that is exacerbated by certain activities.  She has been given muscle relaxers in the past but did not find these to be helpful.  She is used various OTC analgesics but again not helpful.  Denies any sensory changes to the lower extremity, extremity weakness, urinary incontinence or retention.  No fecal incontinence.  No saddle anesthesia.   Past Medical History:  Diagnosis Date  .  Arthritis    "all over"  . DVT (deep venous thrombosis) ([place]) 10/2008   left arm  . GERD (gastroesophageal reflux disease)   . Headache(784.0)    sinus  . 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 PONV (postoperative nausea and vomiting)   . Seasonal allergies   . Sinusitis   . Vitamin D deficiency 04/13/2014   Past Surgical History:  Procedure Laterality Date  . CESAREAN SECTION  09/1999  . DILATION AND CURETTAGE OF UTERUS  1978  . INCISION AND DRAINAGE ABSCESS  10/25/2008   and debridement left axillary abscess, abd. wall abscess, left thigh abscess  . KNEE ARTHROSCOPY Left 11/1996  . MASTECTOMY Bilateral 02/23/2009   right modified radical, left simple  . MASTECTOMY  2011    bilateral   . PORT-A-CATH REMOVAL Left 05/07/2012   Procedure: REMOVAL PORT-A-CATH;  Surgeon: Odis Hollingshead, MD;  Location: [facility];  Service: General;  Laterality: Left;  . PORTACATH PLACEMENT  08/09/2008  . TOTAL KNEE ARTHROPLASTY Left 08/10/2013   Procedure: LEFT TOTAL KNEE ARTHROPLASTY;  Surgeon: Tobi Bastos, MD;  Location: [facility];  Service: Orthopedics;  Laterality: Left;  . TUBAL LIGATION     Social History   Socioeconomic History  . Marital status: Married    Spouse name: Not on file  . Number of children: Not on file  . Years of education: Not on file  . Highest education level: Not on file  Occupational History  . Not on file  Tobacco Use  . Smoking status: Never Smoker  . Smokeless tobacco: Never Used  Substance and Sexual Activity  . Alcohol use: No  . Drug use: No  . Sexual activity: Not Currently  Other Topics Concern  . Not on file  Social History Narrative  . Not on file   Social Determinants of Health   Financial Resource Strain:   . Difficulty of Paying Living Expenses:   Food Insecurity:   . Worried About Charity fundraiser in the Last Year:   . Arboriculturist in the Last Year:   Transportation Needs:   . Film/video editor  (Medical):   Marland Kitchen Lack of Transportation (Non-Medical):   Physical Activity:   . Days of Exercise per Week:   . Minutes of Exercise per Session:   Stress:   . Feeling of Stress :   Social Connections:   . Frequency of Communication with Friends and Family:   . Frequency of Social Gatherings with Friends and Family:   . Attends Religious Services:   . Active Member of Clubs or Organizations:   . Attends Archivist Meetings:   Marland Kitchen Marital Status:   Intimate Partner Violence:   . Fear of Current or Ex-Partner:   . Emotionally Abused:   Marland Kitchen Physically Abused:   . Sexually Abused:    Current Meds  Medication Sig  . ibuprofen (ADVIL,MOTRIN) 200 MG tablet Take 800 mg by mouth every 6 (six) hours as needed.   . Magnesium 500 MG CAPS Take 2 capsules by mouth daily.  Marland Kitchen omeprazole (PRILOSEC) 20 MG capsule Take 20 mg by mouth daily.    Marland Kitchen triamterene-hydrochlorothiazide (MAXZIDE) 75-50 MG tablet Take 1 tablet by mouth daily.   Family History  Problem Relation Age of Onset  . Breast cancer Mother   . Cancer Mother        breast, colon, ovarian  . Heart disease Father   . Cancer Paternal Uncle        pancreatic  . Deep vein thrombosis Brother   . Aortic aneurysm Brother   . ADD / ADHD Daughter   . Asthma Daughter   . Diabetes Maternal Grandmother   . Stroke Maternal Grandmother   . Hypothyroidism Maternal Grandfather   . Heart disease Maternal Grandfather   . Anuerysm Paternal Grandmother   . Heart attack Paternal Grandfather    Allergies  Allergen Reactions  . Demerol Other (See Comments)    MENTAL STATUS CHANGE  . Erythromycin Nausea And Vomiting  . Adhesive [Tape] Rash  . Cephalexin Hives and Other (See Comments)    ABD. PAIN  . Penicillins Other (See Comments)    UNKNOWN - WAS AS A CHILD     Health Maintenance:   ROS: Per HPI  Objective: Office vital signs reviewed. BP (!) 141/101   Pulse 89   Temp 98.6 F (37 C) (Temporal)   Ht _0  (1.626 m)   Wt 210 lb  12.8 oz (95.6 kg)   SpO2 98%   BMI 36.18 kg/m   Physical Examination:  General: Awake, alert, well appearing female, No acute distress HEENT: Normal; no carotid bruits, sclera white, moist mucous membranes    Nose: nasal turbinates moist, narrowing of nasal passage on the right noted.  She has a deviation to the left of the nasal septum.  No significant nasal discharge Cardio: regular rate and rhythm, S1S2 heard, no murmurs appreciated Pulm: clear  to auscultation bilaterally, no wheezes, rhonchi or rales; normal work of breathing on room air  chest : Double mastectomy extremities: warm, well perfused, No edema, cyanosis or clubbing; +2 pulses bilaterally MSK: Normal gait and station Skin: dry; intact; no rashes or lesions Neuro: No focal neurologic deficits Psych: Mood stable, speech normal, affect appropriate, pleasant and interactive  Assessment/ Plan: 59 y.o. female   1. Essential hypertension Not at goal.  Recommend repeat with RN in 2 weeks.  Patient to monitor blood pressures at home with goal of less than 140/90.  If continues to be elevated despite use of Maxide, we will plan to add a calcium channel blocker like Norvasc 5 mg daily.  She will come in for fasting labs - triamterene-hydrochlorothiazide (MAXZIDE) 75-50 MG tablet; Take 1 tablet by mouth daily.  Dispense: 90 tablet; Refill: 1 - Lipid panel; Future - CMP14+EGFR; Future  2. Screen for colon cancer Referral placed to gastroenterology - Ambulatory referral to Gastroenterology  3. Establishing care with new doctor, encounter for No previous records for review  4. History of breast cancer - CBC; Future  5. Gastroesophageal reflux disease without esophagitis Semicontrolled but wishes to be switched to Protonix.  Rx has been sent - pantoprazole (PROTONIX) 40 MG tablet; Take 1 tablet (40 mg total) by mouth daily.  Dispense: 90 tablet; Refill: 1  6. Lumbar paraspinal muscle spasm Home physical therapy exercises  provided.  We discussed that if symptoms are ongoing despite PT and heat, could consider x-ray versus referral to specialty for this issue.  7. Morbid obesity ([place]) - Lipid panel; Future - CMP14+EGFR; Future - TSH; Future - Bayer DCA Hb A1c Waived; Future   Marris Frontera Windell Moulding, [place] [phone]

## 2019-04-26 NOTE — Patient Instructions (Addendum)
Come in for fasting labs.  Referral to GI placed.  I have given you physical therapy exercises to do for your back.   Check your Blood pressure at home.  Your goal < 140/90.

## 2019-05-07 ENCOUNTER — Ambulatory Visit: Payer: Self-pay | Attending: Internal Medicine

## 2019-05-07 DIAGNOSIS — Z23 Encounter for immunization: Secondary | ICD-10-CM

## 2019-05-07 NOTE — Progress Notes (Signed)
   Covid-19 Vaccination Clinic  Name:  KAVON WEAKS    MRN: [id number] DOB: 1960-09-02  05/07/2019  Ms. Wittmeyer was observed post Covid-19 immunization for 15 minutes without incident. She was provided with Vaccine Information Sheet and instruction to access the V-Safe system.   Ms. Beine was instructed to call 911 with any severe reactions post vaccine: Marland Kitchen Difficulty breathing  . Swelling of face and throat  . A fast heartbeat  . A bad rash all over body  . Dizziness and weakness   Immunizations Administered    Name Date Dose VIS Date Route   Moderna COVID-19 Vaccine 05/07/2019 12:54 PM 0.5 mL 01/12/2019 Intramuscular   Manufacturer: Moderna   Lot: [id number]   [id number]

## 2019-05-18 ENCOUNTER — Other Ambulatory Visit: Payer: Self-pay

## 2019-05-18 ENCOUNTER — Other Ambulatory Visit: Payer: Medicare Other

## 2019-05-18 DIAGNOSIS — Z853 Personal history of malignant neoplasm of breast: Secondary | ICD-10-CM

## 2019-05-18 DIAGNOSIS — I10 Essential (primary) hypertension: Secondary | ICD-10-CM

## 2019-05-18 DIAGNOSIS — R739 Hyperglycemia, unspecified: Secondary | ICD-10-CM | POA: Diagnosis not present

## 2019-05-18 LAB — BAYER DCA HB A1C WAIVED: HB A1C (BAYER DCA - WAIVED): 7 % — ABNORMAL HIGH (ref <7.0)

## 2019-05-19 LAB — CMP14+EGFR
ALT: 23 IU/L (ref 0–32)
AST: 29 IU/L (ref 0–40)
Albumin/Globulin Ratio: 1.4 (ref 1.2–2.2)
Albumin: 4.3 g/dL (ref 3.8–4.9)
Alkaline Phosphatase: 90 IU/L (ref 39–117)
BUN/Creatinine Ratio: 21 (ref 12–28)
BUN: 22 mg/dL (ref 8–27)
Bilirubin Total: 0.3 mg/dL (ref 0.0–1.2)
CO2: 23 mmol/L (ref 20–29)
Calcium: 9.1 mg/dL (ref 8.7–10.3)
Chloride: 100 mmol/L (ref 96–106)
Creatinine, Ser: 1.07 mg/dL — ABNORMAL HIGH (ref 0.57–1.00)
GFR calc Af Amer: 65 mL/min/{1.73_m2} (ref >59)
GFR calc non Af Amer: 57 mL/min/{1.73_m2} — ABNORMAL LOW (ref >59)
Globulin, Total: 3 g/dL (ref 1.5–4.5)
Glucose: 121 mg/dL — ABNORMAL HIGH (ref 65–99)
Potassium: 4.2 mmol/L (ref 3.5–5.2)
Sodium: 140 mmol/L (ref 134–144)
Total Protein: 7.3 g/dL (ref 6.0–8.5)

## 2019-05-19 LAB — CBC
Hematocrit: 39.7 % (ref 34.0–46.6)
Hemoglobin: 13.2 g/dL (ref 11.1–15.9)
MCH: 27.6 pg (ref 26.6–33.0)
MCHC: 33.2 g/dL (ref 31.5–35.7)
MCV: 83 fL (ref 79–97)
Platelets: 339 10*3/uL (ref 150–450)
RBC: 4.78 x10E6/uL (ref 3.77–5.28)
RDW: 13.9 % (ref 11.7–15.4)
WBC: 6.7 10*3/uL (ref 3.4–10.8)

## 2019-05-19 LAB — LIPID PANEL
Chol/HDL Ratio: 3.8 ratio (ref 0.0–4.4)
Cholesterol, Total: 190 mg/dL (ref 100–199)
HDL: 50 mg/dL (ref >39)
LDL Chol Calc (NIH): 120 mg/dL — ABNORMAL HIGH (ref 0–99)
Triglycerides: 113 mg/dL (ref 0–149)
VLDL Cholesterol Cal: 20 mg/dL (ref 5–40)

## 2019-05-19 LAB — TSH: TSH: 3.69 u[IU]/mL (ref 0.450–4.500)

## 2019-05-20 ENCOUNTER — Other Ambulatory Visit: Payer: Self-pay

## 2019-05-20 DIAGNOSIS — N289 Disorder of kidney and ureter, unspecified: Secondary | ICD-10-CM

## 2019-06-08 ENCOUNTER — Other Ambulatory Visit: Payer: Self-pay

## 2019-06-08 ENCOUNTER — Encounter: Payer: Self-pay | Admitting: Family Medicine

## 2019-06-08 ENCOUNTER — Ambulatory Visit (INDEPENDENT_AMBULATORY_CARE_PROVIDER_SITE_OTHER): Payer: Medicare Other | Admitting: Family Medicine

## 2019-06-08 VITALS — BP 117/76 | HR 106 | Temp 97.8°F | Ht 64.0 in | Wt 206.2 lb

## 2019-06-08 DIAGNOSIS — E119 Type 2 diabetes mellitus without complications: Secondary | ICD-10-CM | POA: Diagnosis not present

## 2019-06-08 DIAGNOSIS — B351 Tinea unguium: Secondary | ICD-10-CM | POA: Diagnosis not present

## 2019-06-08 DIAGNOSIS — M79671 Pain in right foot: Secondary | ICD-10-CM

## 2019-06-08 DIAGNOSIS — E1122 Type 2 diabetes mellitus with diabetic chronic kidney disease: Secondary | ICD-10-CM | POA: Insufficient documentation

## 2019-06-08 DIAGNOSIS — M79672 Pain in left foot: Secondary | ICD-10-CM

## 2019-06-08 DIAGNOSIS — N183 Chronic kidney disease, stage 3 unspecified: Secondary | ICD-10-CM | POA: Insufficient documentation

## 2019-06-08 DIAGNOSIS — R6889 Other general symptoms and signs: Secondary | ICD-10-CM | POA: Diagnosis not present

## 2019-06-08 DIAGNOSIS — R5383 Other fatigue: Secondary | ICD-10-CM

## 2019-06-08 NOTE — Progress Notes (Signed)
Subjective: CC: DM2, new onset PCP: Janora Norlander, DO [id number] Wendy Gilbert is a 59 y.o. female presenting to clinic today for:  1. Type 2 Diabetes, new onset:  Newly diagnosed with recent A1c of 7.0.  She is had some fatigue.  She wonders if this is a B12 issue.  Does not report any polyuria, polydipsia.  No nausea or vomiting.  No visual disturbance.  She admits to enjoying sweets quite a bit.  She would be interested in seeing a dietitian.  Last eye exam: Needs Last foot exam: Performed today Last A1c:  Lab Results  Component Value Date   HGBA1C 7.0 (H) 05/18/2019   Nephropathy screen indicated?:  Needs Last flu, zoster and/or pneumovax:  Immunization History  Administered Date(s) Administered  . Influenza,inj,Quad PF,6+ Mos 11/27/2012  . Influenza-Unspecified 12/16/2013, 12/28/2014  . Moderna SARS-COVID-2 Vaccination 05/07/2019    ROS: No chest pain, shortness of breath, sensation changes in the feet.     ROS: Per HPI  Allergies  Allergen Reactions  . Demerol Other (See Comments)    MENTAL STATUS CHANGE  . Erythromycin Nausea And Vomiting  . Adhesive [Tape] Rash  . Cephalexin Hives and Other (See Comments)    ABD. PAIN  . Penicillins Other (See Comments)    UNKNOWN - WAS AS A CHILD   Past Medical History:  Diagnosis Date  . Arthritis    "all over"  . DVT (deep venous thrombosis) ([place]) 10/2008   left arm  . GERD (gastroesophageal reflux disease)   . Headache(784.0)    sinus  . 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 PONV (postoperative nausea and vomiting)   . Seasonal allergies   . Sinusitis   . Vitamin D deficiency 04/13/2014    Current Outpatient Medications:  .  ibuprofen (ADVIL,MOTRIN) 200 MG tablet, Take 800 mg by mouth every 6 (six) hours as needed. , Disp: , Rfl:  .  Magnesium 500 MG CAPS, Take 2 capsules by mouth daily., Disp: , Rfl:  .  omeprazole (PRILOSEC) 20 MG capsule, Take 20 mg by mouth daily.  , Disp: , Rfl:  .   pantoprazole (PROTONIX) 40 MG tablet, Take 1 tablet (40 mg total) by mouth daily., Disp: 90 tablet, Rfl: 1 .  triamterene-hydrochlorothiazide (MAXZIDE) 75-50 MG tablet, Take 1 tablet by mouth daily., Disp: 90 tablet, Rfl: 1 Social History   Socioeconomic History  . Marital status: Married    Spouse name: Not on file  . Number of children: Not on file  . Years of education: Not on file  . Highest education level: Not on file  Occupational History  . Not on file  Tobacco Use  . Smoking status: Never Smoker  . Smokeless tobacco: Never Used  Substance and Sexual Activity  . Alcohol use: No  . Drug use: No  . Sexual activity: Not Currently  Other Topics Concern  . Not on file  Social History Narrative  . Not on file   Social Determinants of Health   Financial Resource Strain:   . Difficulty of Paying Living Expenses:   Food Insecurity:   . Worried About Charity fundraiser in the Last Year:   . Arboriculturist in the Last Year:   Transportation Needs:   . Film/video editor (Medical):   Marland Kitchen Lack of Transportation (Non-Medical):   Physical Activity:   . Days of Exercise per Week:   . Minutes of Exercise per Session:   Stress:   .  Feeling of Stress :   Social Connections:   . Frequency of Communication with Friends and Family:   . Frequency of Social Gatherings with Friends and Family:   . Attends Religious Services:   . Active Member of Clubs or Organizations:   . Attends Archivist Meetings:   Marland Kitchen Marital Status:   Intimate Partner Violence:   . Fear of Current or Ex-Partner:   . Emotionally Abused:   Marland Kitchen Physically Abused:   . Sexually Abused:    Family History  Problem Relation Age of Onset  . Breast cancer Mother   . Cancer Mother        breast, colon, ovarian  . Heart disease Father   . Cancer Paternal Uncle        pancreatic  . Deep vein thrombosis Brother   . Aortic aneurysm Brother   . ADD / ADHD Daughter   . Asthma Daughter   . Diabetes  Maternal Grandmother   . Stroke Maternal Grandmother   . Hypothyroidism Maternal Grandfather   . Heart disease Maternal Grandfather   . Anuerysm Paternal Grandmother   . Heart attack Paternal Grandfather     Objective: Office vital signs reviewed. BP 117/76   Pulse (!) 106   Temp 97.8 F (36.6 Wendy)   Ht 5\' 4"  (1.626 m)   Wt 206 lb 3.2 oz (93.5 kg)   SpO2 93%   BMI 35.39 kg/m   Physical Examination:  General: Awake, alert, well nourished, No acute distress HEENT: Normal, sclera white, MMM Cardio: regular rate (not tachycardic on exam) Pulm: normal work of breathing on room air Extremities: warm, well perfused, No edema, cyanosis or clubbing; +2 pulses bilaterally MSK: normal gait and station Neuro: see DM foot Diabetic Foot Exam - Simple   Simple Foot Form Diabetic Foot exam was performed with the following findings: Yes 06/08/2019  5:03 PM  Visual Inspection See comments: Yes Sensation Testing Intact to touch and monofilament testing bilaterally: Yes Pulse Check Posterior Tibialis and Dorsalis pulse intact bilaterally: Yes Comments Excellent pedal pulses.  Vibratory sensation intact bilaterally.  Intact monofilament sensation.  She does have onychomycotic changes to bilateral feet.  Calluses noted along the plantar surface of bilateral great toes.  No ulcerations or skin breakdown.    Assessment/ Plan: 59 y.o. female   1. New onset type 2 diabetes mellitus (HCC) No medications for now with A1c 7.0.  Referral to dietician.  Discussed gold standards of care with DM.  Encouraged to get DM eye exam.  Urine micro and DM foot performed today.  Follow up in 3 months for repeat 123456 - Basic Metabolic Panel - Microalbumin / creatinine urine ratio - Amb ref to Medical Nutrition Therapy-MNT  2. Fatigue, unspecified type Likely due to above. Check b12 - Vitamin B12  3. Onychomycosis - Ambulatory referral to Podiatry  4. Bilateral foot pain - Ambulatory referral to  Podiatry   No orders of the defined types were placed in this encounter.  No orders of the defined types were placed in this encounter.    Janora Norlander, DO [place] [phone]

## 2019-06-08 NOTE — Patient Instructions (Signed)
You had labs performed today.  You will be contacted with the results of the labs once they are available, usually in the next 3 business days for routine lab work.  If you have an active my chart account, they will be released to your MyChart.  If you prefer to have these labs released to you via telephone, please let us know.  If you had a pap smear or biopsy performed, expect to be contacted in about 7-10 days.   Diabetes Mellitus and Standards of Medical Care Managing diabetes (diabetes mellitus) can be complicated. Your diabetes treatment may be managed by a team of health care providers, including:  A physician who specializes in diabetes (endocrinologist).  A nurse practitioner or physician assistant.  Nurses.  A diet and nutrition specialist (registered dietitian).  A certified diabetes educator (CDE).  An exercise specialist.  A pharmacist.  An eye doctor.  A foot specialist (podiatrist).  A dentist.  A primary care provider.  A mental health provider. Your health care providers follow guidelines to help you get the best quality of care. The following schedule is a general guideline for your diabetes management plan. Your health care providers may give you more specific instructions. Physical exams Upon being diagnosed with diabetes mellitus, and each year after that, your health care provider will ask about your medical and family history. He or she will also do a physical exam. Your exam may include:  Measuring your height, weight, and body mass index (BMI).  Checking your blood pressure. This will be done at every routine medical visit. Your target blood pressure may vary depending on your medical conditions, your age, and other factors.  Thyroid gland exam.  Skin exam.  Screening for damage to your nerves (peripheral neuropathy). This may include checking the pulse in your legs and feet and checking the level of sensation in your hands and feet.  A complete  foot exam to inspect the structure and skin of your feet, including checking for cuts, bruises, redness, blisters, sores, or other problems.  Screening for blood vessel (vascular) problems, which may include checking the pulse in your legs and feet and checking your temperature. Blood tests Depending on your treatment plan and your personal needs, you may have the following tests done:  HbA1c (hemoglobin A1c). This test provides information about blood sugar (glucose) control over the previous 2-3 months. It is used to adjust your treatment plan, if needed. This test will be done: ? At least 2 times a year, if you are meeting your treatment goals. ? 4 times a year, if you are not meeting your treatment goals or if treatment goals have changed.  Lipid testing, including total, LDL, and HDL cholesterol and triglyceride levels. ? The goal for LDL is less than 100 mg/dL (5.5 mmol/L). If you are at high risk for complications, the goal is less than 70 mg/dL (3.9 mmol/L). ? The goal for HDL is 40 mg/dL (2.2 mmol/L) or higher for men and 50 mg/dL (2.8 mmol/L) or higher for women. An HDL cholesterol of 60 mg/dL (3.3 mmol/L) or higher gives some protection against heart disease. ? The goal for triglycerides is less than 150 mg/dL (8.3 mmol/L).  Liver function tests.  Kidney function tests.  Thyroid function tests. Dental and eye exams  Visit your dentist two times a year.  If you have type 1 diabetes, your health care provider may recommend an eye exam 3-5 years after you are diagnosed, and then once a year  after your first exam. ? For children with type 1 diabetes, a health care provider may recommend an eye exam when your child is age 47 or older and has had diabetes for 3-5 years. After the first exam, your child should get an eye exam once a year.  If you have type 2 diabetes, your health care provider may recommend an eye exam as soon as you are diagnosed, and then once a year after your first  exam. Immunizations   The yearly flu (influenza) vaccine is recommended for everyone 6 months or older who has diabetes.  The pneumonia (pneumococcal) vaccine is recommended for everyone 2 years or older who has diabetes. If you are 24 or older, you may get the pneumonia vaccine as a series of two separate shots.  The hepatitis B vaccine is recommended for adults shortly after being diagnosed with diabetes.  Adults and children with diabetes should receive all other vaccines according to age-specific recommendations from the Centers for Disease Control and Prevention (CDC). Mental and emotional health Screening for symptoms of eating disorders, anxiety, and depression is recommended at the time of diagnosis and afterward as needed. If your screening shows that you have symptoms (positive screening result), you may need more evaluation and you may work with a mental health care provider. Treatment plan Your treatment plan will be reviewed at every medical visit. You and your health care provider will discuss:  How you are taking your medicines, including insulin.  Any side effects you are experiencing.  Your blood glucose target goals.  The frequency of your blood glucose monitoring.  Lifestyle habits, such as activity level as well as tobacco, alcohol, and substance use. Diabetes self-management education Your health care provider will assess how well you are monitoring your blood glucose levels and whether you are taking your insulin correctly. He or she may refer you to:  A certified diabetes educator to manage your diabetes throughout your life, starting at diagnosis.  A registered dietitian who can create or review your personal nutrition plan.  An exercise specialist who can discuss your activity level and exercise plan. Summary  Managing diabetes (diabetes mellitus) can be complicated. Your diabetes treatment may be managed by a team of health care providers.  Your health  care providers follow guidelines in order to help you get the best quality of care.  Standards of care including having regular physical exams, blood tests, blood pressure monitoring, immunizations, screening tests, and education about how to manage your diabetes.  Your health care providers may also give you more specific instructions based on your individual health. This information is not intended to replace advice given to you by your health care provider. Make sure you discuss any questions you have with your health care provider. Document Revised: 10/17/2017 Document Reviewed: 10/27/2015 Elsevier Patient Education  [place].

## 2019-06-09 ENCOUNTER — Ambulatory Visit: Payer: Self-pay | Attending: Internal Medicine

## 2019-06-09 ENCOUNTER — Other Ambulatory Visit: Payer: Self-pay | Admitting: Family Medicine

## 2019-06-09 DIAGNOSIS — R7989 Other specified abnormal findings of blood chemistry: Secondary | ICD-10-CM

## 2019-06-09 DIAGNOSIS — Z23 Encounter for immunization: Secondary | ICD-10-CM

## 2019-06-09 LAB — BASIC METABOLIC PANEL
BUN/Creatinine Ratio: 17 (ref 12–28)
BUN: 22 mg/dL (ref 8–27)
CO2: 20 mmol/L (ref 20–29)
Calcium: 9.8 mg/dL (ref 8.7–10.3)
Chloride: 100 mmol/L (ref 96–106)
Creatinine, Ser: 1.31 mg/dL — ABNORMAL HIGH (ref 0.57–1.00)
GFR calc Af Amer: 51 mL/min/{1.73_m2} — ABNORMAL LOW (ref >59)
GFR calc non Af Amer: 44 mL/min/{1.73_m2} — ABNORMAL LOW (ref >59)
Glucose: 119 mg/dL — ABNORMAL HIGH (ref 65–99)
Potassium: 4.1 mmol/L (ref 3.5–5.2)
Sodium: 141 mmol/L (ref 134–144)

## 2019-06-09 LAB — MICROALBUMIN / CREATININE URINE RATIO
Creatinine, Urine: 91.1 mg/dL
Microalb/Creat Ratio: 14 mg/g creat (ref 0–29)
Microalbumin, Urine: 12.6 ug/mL

## 2019-06-09 LAB — VITAMIN B12: Vitamin B-12: 434 pg/mL (ref 232–1245)

## 2019-06-09 NOTE — Progress Notes (Signed)
   Covid-19 Vaccination Clinic  Name:  Wendy Gilbert    MRN: [id number] DOB: 01-Feb-1961  06/09/2019  [name] was observed post Covid-19 immunization for 15 minutes without incident. She was provided with Vaccine Information Sheet and instruction to access the V-Safe system.   [name] was instructed to call 911 with any severe reactions post vaccine: Marland Kitchen Difficulty breathing  . Swelling of face and throat  . A fast heartbeat  . A bad rash all over body  . Dizziness and weakness   Immunizations Administered    Name Date Dose VIS Date Route   Moderna COVID-19 Vaccine 06/09/2019 10:13 AM 0.5 mL 01/2019 Intramuscular   Manufacturer: Moderna   Lot: [id number]   [id number]

## 2019-06-11 ENCOUNTER — Other Ambulatory Visit: Payer: Self-pay

## 2019-06-11 ENCOUNTER — Other Ambulatory Visit: Payer: Medicare Other

## 2019-06-11 DIAGNOSIS — R7989 Other specified abnormal findings of blood chemistry: Secondary | ICD-10-CM | POA: Diagnosis not present

## 2019-06-11 DIAGNOSIS — N289 Disorder of kidney and ureter, unspecified: Secondary | ICD-10-CM

## 2019-06-12 LAB — CMP14+EGFR
ALT: 26 IU/L (ref 0–32)
AST: 37 IU/L (ref 0–40)
Albumin/Globulin Ratio: 1.3 (ref 1.2–2.2)
Albumin: 4.4 g/dL (ref 3.8–4.9)
Alkaline Phosphatase: 80 IU/L (ref 39–117)
BUN/Creatinine Ratio: 13 (ref 12–28)
BUN: 15 mg/dL (ref 8–27)
Bilirubin Total: 0.3 mg/dL (ref 0.0–1.2)
CO2: 23 mmol/L (ref 20–29)
Calcium: 9.4 mg/dL (ref 8.7–10.3)
Chloride: 98 mmol/L (ref 96–106)
Creatinine, Ser: 1.2 mg/dL — ABNORMAL HIGH (ref 0.57–1.00)
GFR calc Af Amer: 57 mL/min/{1.73_m2} — ABNORMAL LOW (ref >59)
GFR calc non Af Amer: 49 mL/min/{1.73_m2} — ABNORMAL LOW (ref >59)
Globulin, Total: 3.3 g/dL (ref 1.5–4.5)
Glucose: 200 mg/dL — ABNORMAL HIGH (ref 65–99)
Potassium: 3.7 mmol/L (ref 3.5–5.2)
Sodium: 139 mmol/L (ref 134–144)
Total Protein: 7.7 g/dL (ref 6.0–8.5)

## 2019-06-12 LAB — RENAL FUNCTION PANEL: Phosphorus: 2.3 mg/dL — ABNORMAL LOW (ref 3.0–4.3)

## 2019-06-16 ENCOUNTER — Other Ambulatory Visit: Payer: Self-pay | Admitting: Family Medicine

## 2019-06-16 DIAGNOSIS — R7989 Other specified abnormal findings of blood chemistry: Secondary | ICD-10-CM

## 2019-06-17 ENCOUNTER — Inpatient Hospital Stay (HOSPITAL_COMMUNITY): Payer: Medicare Other | Attending: Hematology

## 2019-06-17 ENCOUNTER — Other Ambulatory Visit: Payer: Self-pay

## 2019-06-17 DIAGNOSIS — K219 Gastro-esophageal reflux disease without esophagitis: Secondary | ICD-10-CM | POA: Insufficient documentation

## 2019-06-17 DIAGNOSIS — M25511 Pain in right shoulder: Secondary | ICD-10-CM | POA: Insufficient documentation

## 2019-06-17 DIAGNOSIS — Z79899 Other long term (current) drug therapy: Secondary | ICD-10-CM | POA: Diagnosis not present

## 2019-06-17 DIAGNOSIS — C50411 Malignant neoplasm of upper-outer quadrant of right female breast: Secondary | ICD-10-CM | POA: Insufficient documentation

## 2019-06-17 DIAGNOSIS — M1990 Unspecified osteoarthritis, unspecified site: Secondary | ICD-10-CM | POA: Diagnosis not present

## 2019-06-17 DIAGNOSIS — Z171 Estrogen receptor negative status [ER-]: Secondary | ICD-10-CM | POA: Diagnosis not present

## 2019-06-17 DIAGNOSIS — C50911 Malignant neoplasm of unspecified site of right female breast: Secondary | ICD-10-CM

## 2019-06-17 DIAGNOSIS — Z86718 Personal history of other venous thrombosis and embolism: Secondary | ICD-10-CM | POA: Diagnosis not present

## 2019-06-17 DIAGNOSIS — G8929 Other chronic pain: Secondary | ICD-10-CM | POA: Diagnosis not present

## 2019-06-17 DIAGNOSIS — Z803 Family history of malignant neoplasm of breast: Secondary | ICD-10-CM | POA: Insufficient documentation

## 2019-06-17 DIAGNOSIS — Z923 Personal history of irradiation: Secondary | ICD-10-CM | POA: Insufficient documentation

## 2019-06-17 DIAGNOSIS — K760 Fatty (change of) liver, not elsewhere classified: Secondary | ICD-10-CM | POA: Diagnosis not present

## 2019-06-17 DIAGNOSIS — E559 Vitamin D deficiency, unspecified: Secondary | ICD-10-CM | POA: Diagnosis not present

## 2019-06-17 DIAGNOSIS — R200 Anesthesia of skin: Secondary | ICD-10-CM | POA: Diagnosis not present

## 2019-06-17 DIAGNOSIS — Z9013 Acquired absence of bilateral breasts and nipples: Secondary | ICD-10-CM | POA: Insufficient documentation

## 2019-06-17 LAB — CBC WITH DIFFERENTIAL/PLATELET
Abs Immature Granulocytes: 0.02 10*3/uL (ref 0.00–0.07)
Basophils Absolute: 0.1 10*3/uL (ref 0.0–0.1)
Basophils Relative: 1 %
Eosinophils Absolute: 0.2 10*3/uL (ref 0.0–0.5)
Eosinophils Relative: 4 %
HCT: 41.5 % (ref 36.0–46.0)
Hemoglobin: 12.9 g/dL (ref 12.0–15.0)
Immature Granulocytes: 0 %
Lymphocytes Relative: 41 %
Lymphs Abs: 2.6 10*3/uL (ref 0.7–4.0)
MCH: 27.3 pg (ref 26.0–34.0)
MCHC: 31.1 g/dL (ref 30.0–36.0)
MCV: 87.9 fL (ref 80.0–100.0)
Monocytes Absolute: 0.6 10*3/uL (ref 0.1–1.0)
Monocytes Relative: 9 %
Neutro Abs: 2.9 10*3/uL (ref 1.7–7.7)
Neutrophils Relative %: 45 %
Platelets: 332 10*3/uL (ref 150–400)
RBC: 4.72 MIL/uL (ref 3.87–5.11)
RDW: 13.7 % (ref 11.5–15.5)
WBC: 6.3 10*3/uL (ref 4.0–10.5)
nRBC: 0 % (ref 0.0–0.2)

## 2019-06-17 LAB — COMPREHENSIVE METABOLIC PANEL
ALT: 25 U/L (ref 0–44)
AST: 25 U/L (ref 15–41)
Albumin: 4.2 g/dL (ref 3.5–5.0)
Alkaline Phosphatase: 63 U/L (ref 38–126)
Anion gap: 10 (ref 5–15)
BUN: 23 mg/dL — ABNORMAL HIGH (ref 6–20)
CO2: 28 mmol/L (ref 22–32)
Calcium: 9.3 mg/dL (ref 8.9–10.3)
Chloride: 103 mmol/L (ref 98–111)
Creatinine, Ser: 1.03 mg/dL — ABNORMAL HIGH (ref 0.44–1.00)
GFR calc Af Amer: >60 mL/min (ref >60)
GFR calc non Af Amer: 59 mL/min — ABNORMAL LOW (ref >60)
Glucose, Bld: 125 mg/dL — ABNORMAL HIGH (ref 70–99)
Potassium: 3.7 mmol/L (ref 3.5–5.1)
Sodium: 141 mmol/L (ref 135–145)
Total Bilirubin: 0.5 mg/dL (ref 0.3–1.2)
Total Protein: 8 g/dL (ref 6.5–8.1)

## 2019-06-17 LAB — VITAMIN D 25 HYDROXY (VIT D DEFICIENCY, FRACTURES): Vit D, 25-Hydroxy: 39.41 ng/mL (ref 30–100)

## 2019-06-24 ENCOUNTER — Inpatient Hospital Stay (HOSPITAL_BASED_OUTPATIENT_CLINIC_OR_DEPARTMENT_OTHER): Payer: Medicare Other | Admitting: Hematology

## 2019-06-24 ENCOUNTER — Other Ambulatory Visit: Payer: Self-pay

## 2019-06-24 VITALS — BP 138/93 | HR 111 | Temp 96.8°F | Resp 17 | Wt 205.6 lb

## 2019-06-24 DIAGNOSIS — E559 Vitamin D deficiency, unspecified: Secondary | ICD-10-CM

## 2019-06-24 DIAGNOSIS — C50411 Malignant neoplasm of upper-outer quadrant of right female breast: Secondary | ICD-10-CM | POA: Diagnosis not present

## 2019-06-24 DIAGNOSIS — G8929 Other chronic pain: Secondary | ICD-10-CM | POA: Diagnosis not present

## 2019-06-24 DIAGNOSIS — R200 Anesthesia of skin: Secondary | ICD-10-CM | POA: Diagnosis not present

## 2019-06-24 DIAGNOSIS — C50911 Malignant neoplasm of unspecified site of right female breast: Secondary | ICD-10-CM

## 2019-06-24 DIAGNOSIS — K219 Gastro-esophageal reflux disease without esophagitis: Secondary | ICD-10-CM | POA: Diagnosis not present

## 2019-06-24 DIAGNOSIS — Z79899 Other long term (current) drug therapy: Secondary | ICD-10-CM | POA: Diagnosis not present

## 2019-06-24 DIAGNOSIS — Z86718 Personal history of other venous thrombosis and embolism: Secondary | ICD-10-CM | POA: Diagnosis not present

## 2019-06-24 DIAGNOSIS — M25511 Pain in right shoulder: Secondary | ICD-10-CM | POA: Diagnosis not present

## 2019-06-24 DIAGNOSIS — Z9013 Acquired absence of bilateral breasts and nipples: Secondary | ICD-10-CM | POA: Diagnosis not present

## 2019-06-24 DIAGNOSIS — Z923 Personal history of irradiation: Secondary | ICD-10-CM | POA: Diagnosis not present

## 2019-06-24 DIAGNOSIS — M1990 Unspecified osteoarthritis, unspecified site: Secondary | ICD-10-CM | POA: Diagnosis not present

## 2019-06-24 DIAGNOSIS — Z171 Estrogen receptor negative status [ER-]: Secondary | ICD-10-CM | POA: Diagnosis not present

## 2019-06-24 DIAGNOSIS — Z803 Family history of malignant neoplasm of breast: Secondary | ICD-10-CM | POA: Diagnosis not present

## 2019-06-24 DIAGNOSIS — K760 Fatty (change of) liver, not elsewhere classified: Secondary | ICD-10-CM | POA: Diagnosis not present

## 2019-06-24 NOTE — Assessment & Plan Note (Signed)
1.  Stage IIIc inflammatory right breast cancer, triple negative: - Presentation with positive supraclavicular, infraclavicular, axillary nodes with eroding mass in the upper outer quadrant of the right breast, 12 x 11.5 cm, status post FEC x5 cycles, followed by carboplatin and Taxotere for 4 cycles, followed by bilateral mastectomies, Y PT 0, ypN0 on 02/23/2009. -Completed XRT in March 2011. -Bilateral mastectomy sites are within normal limits.  No palpable adenopathy. -We reviewed her labs which showed normal CBC.  We will plan to see her back in a year with repeat labs.  2.  Left upper extremity DVT: -This will support induced and underwent 6 months of anticoagulation.  The port was removed.  3.  Fatty liver:  -Previous ultrasound showed fatty liver.  LFTs are normal today.  4.  Vitamin D deficiency: -She has stopped taking vitamin D.  However her vitamin D level is normal at 39.4.  We will recheck it next time.

## 2019-06-24 NOTE — Progress Notes (Signed)
[place], [place] [address]   CLINIC:  Medical Oncology/Hematology  PCP:  Janora Norlander, DO [place] [address] [phone]   REASON FOR VISIT:  Follow-up for right breast cancer.  PRIOR THERAPY: -FEC x5 cycles followed by carboplatin and Taxotere for 4 cycles -Bilateral mastectomy, YPT0Y PN 0 on 02/23/2009 -Completed XRT in March 2011   CURRENT THERAPY: Observation  BRIEF ONCOLOGIC HISTORY:  Oncology History  Inflammatory carcinoma of right breast  08/01/2010 Initial Diagnosis   Inflammatory carcinoma of right breast     CANCER STAGING: Cancer Staging Inflammatory carcinoma of right breast Staging form: Breast, AJCC 7th Edition - Clinical: Stage IIIC (T4d, N3c, cM0) - Signed by Baird Cancer, PA on 08/01/2010    INTERVAL HISTORY:  Ms. Manzanares 59 y.o. female seen for follow-up of right breast cancer.  Denies any new onset pains.  Reports appetite of 100%.  Energy levels are 25%.  Chronic right shoulder pain reported as 7 out of 10.  Numbness in the left foot is also stable.  She quit taking vitamin D tablet 4 months ago.    REVIEW OF SYSTEMS:  Review of Systems  Neurological: Positive for numbness.  All other systems reviewed and are negative.    PAST MEDICAL/SURGICAL HISTORY:  Past Medical History:  Diagnosis Date  . Arthritis    "all over"  . DVT (deep venous thrombosis) ([place]) 10/2008   left arm  . GERD (gastroesophageal reflux disease)   . Headache(784.0)    sinus  . 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 PONV (postoperative nausea and vomiting)   . Seasonal allergies   . Sinusitis   . Vitamin D deficiency 04/13/2014   Past Surgical History:  Procedure Laterality Date  . CESAREAN SECTION  09/1999  . DILATION AND CURETTAGE OF UTERUS  1978  . INCISION AND DRAINAGE ABSCESS  10/25/2008   and debridement left axillary abscess, abd. wall abscess, left thigh abscess  . KNEE  ARTHROSCOPY Left 11/1996  . MASTECTOMY Bilateral 02/23/2009   right modified radical, left simple  . MASTECTOMY  2011    bilateral   . PORT-A-CATH REMOVAL Left 05/07/2012   Procedure: REMOVAL PORT-A-CATH;  Surgeon: Odis Hollingshead, MD;  Location: [facility];  Service: General;  Laterality: Left;  . PORTACATH PLACEMENT  08/09/2008  . TOTAL KNEE ARTHROPLASTY Left 08/10/2013   Procedure: LEFT TOTAL KNEE ARTHROPLASTY;  Surgeon: Tobi Bastos, MD;  Location: [facility];  Service: Orthopedics;  Laterality: Left;  . TUBAL LIGATION       SOCIAL HISTORY:  Social History   Socioeconomic History  . Marital status: Married    Spouse name: Not on file  . Number of children: Not on file  . Years of education: Not on file  . Highest education level: Not on file  Occupational History  . Not on file  Tobacco Use  . Smoking status: Never Smoker  . Smokeless tobacco: Never Used  Substance and Sexual Activity  . Alcohol use: No  . Drug use: No  . Sexual activity: Not Currently  Other Topics Concern  . Not on file  Social History Narrative  . Not on file   Social Determinants of Health   Financial Resource Strain:   . Difficulty of Paying Living Expenses:   Food Insecurity:   . Worried About Charity fundraiser in the Last Year:   . [employer]  in the Last Year:   Transportation Needs:   . Film/video editor (Medical):   Marland Kitchen Lack of Transportation (Non-Medical):   Physical Activity:   . Days of Exercise per Week:   . Minutes of Exercise per Session:   Stress:   . Feeling of Stress :   Social Connections:   . Frequency of Communication with Friends and Family:   . Frequency of Social Gatherings with Friends and Family:   . Attends Religious Services:   . Active Member of Clubs or Organizations:   . Attends Archivist Meetings:   Marland Kitchen Marital Status:   Intimate Partner Violence:   . Fear of Current or Ex-Partner:   . Emotionally Abused:   Marland Kitchen Physically  Abused:   . Sexually Abused:     FAMILY HISTORY:  Family History  Problem Relation Age of Onset  . Breast cancer Mother   . Cancer Mother        breast, colon, ovarian  . Heart disease Father   . Cancer Paternal Uncle        pancreatic  . Deep vein thrombosis Brother   . Aortic aneurysm Brother   . ADD / ADHD Daughter   . Asthma Daughter   . Diabetes Maternal Grandmother   . Stroke Maternal Grandmother   . Hypothyroidism Maternal Grandfather   . Heart disease Maternal Grandfather   . Anuerysm Paternal Grandmother   . Heart attack Paternal Grandfather     CURRENT MEDICATIONS:  Outpatient Encounter Medications as of 06/24/2019  Medication Sig  . fluticasone (FLONASE) 50 MCG/ACT nasal spray Place into both nostrils daily.  . Magnesium 500 MG CAPS Take 2 capsules by mouth daily.  . pantoprazole (PROTONIX) 40 MG tablet Take 1 tablet (40 mg total) by mouth daily.  Marland Kitchen triamterene-hydrochlorothiazide (MAXZIDE) 75-50 MG tablet Take 1 tablet by mouth daily.   No facility-administered encounter medications on file as of 06/24/2019.    ALLERGIES:  Allergies  Allergen Reactions  . Demerol Other (See Comments)    MENTAL STATUS CHANGE  . Erythromycin Nausea And Vomiting  . Adhesive [Tape] Rash  . Cephalexin Hives and Other (See Comments)    ABD. PAIN  . Penicillins Other (See Comments)    UNKNOWN - WAS AS A CHILD     PHYSICAL EXAM:  ECOG Performance status: 1  Vitals:   06/24/19 1511  BP: (!) 138/93  Pulse: (!) 111  Resp: 17  Temp: (!) 96.8 F (36 C)  SpO2: 97%   Filed Weights   06/24/19 1511  Weight: 205 lb 9.6 oz (93.3 kg)   Physical Exam Vitals reviewed.  Constitutional:      Appearance: Normal appearance.  Cardiovascular:     Rate and Rhythm: Normal rate and regular rhythm.     Heart sounds: Normal heart sounds.  Pulmonary:     Effort: Pulmonary effort is normal.     Breath sounds: Normal breath sounds.  Abdominal:     General: There is no distension.      Palpations: Abdomen is soft. There is no mass.  Lymphadenopathy:     Cervical: No cervical adenopathy.  Skin:    General: Skin is warm.  Neurological:     General: No focal deficit present.     Mental Status: She is alert and oriented to person, place, and time.  Psychiatric:        Mood and Affect: Mood normal.        Behavior: Behavior normal.  Bilateral mastectomy sites are within normal limits.  LABORATORY DATA:  I have reviewed the labs as listed.  CBC    Component Value Date/Time   WBC 6.3 06/17/2019 1404   RBC 4.72 06/17/2019 1404   HGB 12.9 06/17/2019 1404   HGB 13.2 05/18/2019 1114   HCT 41.5 06/17/2019 1404   HCT 39.7 05/18/2019 1114   PLT 332 06/17/2019 1404   PLT 339 05/18/2019 1114   MCV 87.9 06/17/2019 1404   MCV 83 05/18/2019 1114   MCH 27.3 06/17/2019 1404   MCHC 31.1 06/17/2019 1404   RDW 13.7 06/17/2019 1404   RDW 13.9 05/18/2019 1114   LYMPHSABS 2.6 06/17/2019 1404   MONOABS 0.6 06/17/2019 1404   EOSABS 0.2 06/17/2019 1404   BASOSABS 0.1 06/17/2019 1404   CMP Latest Ref Rng & Units 06/17/2019 06/11/2019 06/08/2019  Glucose 70 - 99 mg/dL 125(H) 200(H) 119(H)  BUN 6 - 20 mg/dL 23(H) 15 22  Creatinine 0.44 - 1.00 mg/dL 1.03(H) 1.20(H) 1.31(H)  Sodium 135 - 145 mmol/L 141 139 141  Potassium 3.5 - 5.1 mmol/L 3.7 3.7 4.1  Chloride 98 - 111 mmol/L 103 98 100  CO2 22 - 32 mmol/L 28 23 20   Calcium 8.9 - 10.3 mg/dL 9.3 9.4 9.8  Total Protein 6.5 - 8.1 g/dL 8.0 7.7 -  Total Bilirubin 0.3 - 1.2 mg/dL 0.5 0.3 -  Alkaline Phos 38 - 126 U/L 63 80 -  AST 15 - 41 U/L 25 37 -  ALT 0 - 44 U/L 25 26 -    DIAGNOSTIC IMAGING:  I have independently reviewed the scans and discussed with the patient.  ASSESSMENT & PLAN:  Inflammatory carcinoma of right breast 1.  Stage IIIc inflammatory right breast cancer, triple negative: - Presentation with positive supraclavicular, infraclavicular, axillary nodes with eroding mass in the upper outer quadrant of the right  breast, 12 x 11.5 cm, status post FEC x5 cycles, followed by carboplatin and Taxotere for 4 cycles, followed by bilateral mastectomies, Y PT 0, ypN0 on 02/23/2009. -Completed XRT in March 2011. -Bilateral mastectomy sites are within normal limits.  No palpable adenopathy. -We reviewed her labs which showed normal CBC.  We will plan to see her back in a year with repeat labs.  2.  Left upper extremity DVT: -This will support induced and underwent 6 months of anticoagulation.  The port was removed.  3.  Fatty liver:  -Previous ultrasound showed fatty liver.  LFTs are normal today.  4.  Vitamin D deficiency: -She has stopped taking vitamin D.  However her vitamin D level is normal at 39.4.  We will recheck it next time.     Orders placed this encounter:  Orders Placed This Encounter  Procedures  . CBC with Differential  . Comprehensive metabolic panel  . Vitamin D 25 hydroxy      Derek Jack, MD [place] [phone]

## 2019-06-24 NOTE — Patient Instructions (Addendum)
[facility] at [facility] Discharge Instructions  You were seen today by Dr. Katragadda. He went over your recent lab results. He will see you back in 1 year for labs and follow up.   Thank you for choosing [facility] at [facility] to provide your oncology and hematology care.  To afford each patient quality time with our provider, please arrive at least 15 minutes before your scheduled appointment time.   If you have a lab appointment with the Cancer Center please come in thru the  Main Entrance and check in at the main information desk  You need to re-schedule your appointment should you arrive 10 or more minutes late.  We strive to give you quality time with our providers, and arriving late affects you and other patients whose appointments are after yours.  Also, if you no show three or more times for appointments you may be dismissed from the clinic at the providers discretion.     Again, thank you for choosing [facility].  Our hope is that these requests will decrease the amount of time that you wait before being seen by our physicians.       _____________________________________________________________  Should you have questions after your visit to [facility], please contact our office at [phone] between the hours of 8:00 a.m. and 4:30 p.m.  Voicemails left after 4:00 p.m. will not be returned until the following business day.  For prescription refill requests, have your pharmacy contact our office and allow 72 hours.    Cancer Center Support Programs:   > Cancer Support Group  2nd Tuesday of the month 1pm-2pm, Journey Room    

## 2019-06-28 ENCOUNTER — Ambulatory Visit (INDEPENDENT_AMBULATORY_CARE_PROVIDER_SITE_OTHER): Payer: Medicare Other | Admitting: *Deleted

## 2019-06-28 DIAGNOSIS — Z0000 Encounter for general adult medical examination without abnormal findings: Secondary | ICD-10-CM

## 2019-06-28 NOTE — Progress Notes (Signed)
MEDICARE ANNUAL WELLNESS VISIT  06/28/2019  Telephone Visit Disclaimer This Medicare AWV was conducted by telephone due to national recommendations for restrictions regarding the COVID-19 Pandemic (e.g. social distancing).  I verified, using two identifiers, that I am speaking with Wendy Gilbert or their authorized healthcare agent. I discussed the limitations, risks, security, and privacy concerns of performing an evaluation and management service by telephone and the potential availability of an in-person appointment in the future. The patient expressed understanding and agreed to proceed.   Subjective:  Wendy Gilbert is a 59 y.o. female patient of Wendy Norlander, DO who had a Medicare Annual Wellness Visit today via telephone. Wendy Gilbert is disabled and lives with her husband Wendy Gilbert and teenage daughter. She has 2 daughters. She reports that she is socially active and does interact with friends/family regularly. She is not physically active and enjoys watching TV.  Patient Care Team: Wendy Norlander, DO as PCP - General (Family Medicine) Tyler Pita, MD as Consulting Physician (Radiation Oncology)  Advanced Directives 06/28/2019 06/24/2019 06/19/2018 06/11/2017 06/11/2016 05/01/2015 10/27/2014  Does Patient Have a Medical Advance Directive? No No No No No No No  Would patient like information on creating a medical advance directive? No - Patient declined Yes (MAU/Ambulatory/Procedural Areas - Information given) No - Patient declined No - Patient declined No - Patient declined No - patient declined information No - patient declined information  Pre-existing out of facility DNR order (yellow form or pink MOST form) - - - - - - -    Hospital Utilization Over the Past 12 Months: # of hospitalizations or ER visits: 0 # of surgeries: 0  Review of Systems    Patient reports that her overall health is worse compared to last year.  History obtained from the patient and patient chart.    Patient Reported Readings (BP, Pulse, CBG, Weight, etc) none  Pain Assessment Pain : 0-10 Pain Score: 8  Pain Type: Chronic pain Pain Location: Shoulder Pain Orientation: Right, Left Pain Descriptors / Indicators: Aching Pain Onset: More than a month ago Pain Frequency: Constant Pain Relieving Factors: tylenol arthritis Effect of Pain on Daily Activities: none  Pain Relieving Factors: tylenol arthritis  Current Medications & Allergies (verified) Allergies as of 06/28/2019      Reactions   Demerol Other (See Comments)   MENTAL STATUS CHANGE   Erythromycin Nausea And Vomiting   Adhesive [tape] Rash   Cephalexin Hives, Other (See Comments)   ABD. PAIN   Penicillins Other (See Comments)   UNKNOWN - WAS AS A CHILD      Medication List       Accurate as of Jun 28, 2019  2:50 PM. If you have any questions, ask your nurse or doctor.        fluticasone 50 MCG/ACT nasal spray Commonly known as: FLONASE Place into both nostrils daily.   Magnesium 500 MG Caps Take 2 capsules by mouth daily.   pantoprazole 40 MG tablet Commonly known as: Protonix Take 1 tablet (40 mg total) by mouth daily.   triamterene-hydrochlorothiazide 75-50 MG tablet Commonly known as: MAXZIDE Take 1 tablet by mouth daily.   TYLENOL EX ST ARTHRITIS PAIN PO Take by mouth.       History (reviewed): Past Medical History:  Diagnosis Date  . Arthritis    "all over"  . Diabetes mellitus without complication ([place])   . DVT (deep venous thrombosis) ([place]) 10/2008   left arm  . GERD (gastroesophageal  reflux disease)   . Headache(784.0)    sinus  . 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 PONV (postoperative nausea and vomiting)   . Seasonal allergies   . Sinusitis   . Vitamin D deficiency 04/13/2014   Past Surgical History:  Procedure Laterality Date  . CESAREAN SECTION  09/1999  . DILATION AND CURETTAGE OF UTERUS  1978  . INCISION AND DRAINAGE ABSCESS  10/25/2008   and  debridement left axillary abscess, abd. wall abscess, left thigh abscess  . KNEE ARTHROSCOPY Left 11/1996  . MASTECTOMY Bilateral 02/23/2009   right modified radical, left simple  . MASTECTOMY  2011    bilateral   . PORT-A-CATH REMOVAL Left 05/07/2012   Procedure: REMOVAL PORT-A-CATH;  Surgeon: Odis Hollingshead, MD;  Location: [facility];  Service: General;  Laterality: Left;  . PORTACATH PLACEMENT  08/09/2008  . TOTAL KNEE ARTHROPLASTY Left 08/10/2013   Procedure: LEFT TOTAL KNEE ARTHROPLASTY;  Surgeon: Tobi Bastos, MD;  Location: [facility];  Service: Orthopedics;  Laterality: Left;  . TUBAL LIGATION     Family History  Problem Relation Age of Onset  . Breast cancer Mother   . Cancer Mother        breast, colon, ovarian  . Heart disease Father   . Cancer Paternal Uncle        pancreatic  . Deep vein thrombosis Brother   . Aortic aneurysm Brother   . ADD / ADHD Daughter   . Asthma Daughter   . Diabetes Maternal Grandmother   . Stroke Maternal Grandmother   . Hypothyroidism Maternal Grandfather   . Heart disease Maternal Grandfather   . Anuerysm Paternal Grandmother   . Heart attack Paternal Grandfather    Social History   Socioeconomic History  . Marital status: Married    Spouse name: Wendy Gilbert  . Number of children: 2  . Years of education: 23  . Highest education level: Some college, no degree  Occupational History  . Occupation: disabled  Tobacco Use  . Smoking status: Never Smoker  . Smokeless tobacco: Never Used  Substance and Sexual Activity  . Alcohol use: No  . Drug use: No  . Sexual activity: Not Currently  Other Topics Concern  . Not on file  Social History Narrative  . Not on file   Social Determinants of Health   Financial Resource Strain:   . Difficulty of Paying Living Expenses:   Food Insecurity:   . Worried About Charity fundraiser in the Last Year:   . Arboriculturist in the Last Year:   Transportation Needs:   . Lexicographer (Medical):   Marland Kitchen Lack of Transportation (Non-Medical):   Physical Activity:   . Days of Exercise per Week:   . Minutes of Exercise per Session:   Stress:   . Feeling of Stress :   Social Connections:   . Frequency of Communication with Friends and Family:   . Frequency of Social Gatherings with Friends and Family:   . Attends Religious Services:   . Active Member of Clubs or Organizations:   . Attends Archivist Meetings:   Marland Kitchen Marital Status:     Activities of Daily Living In your present state of health, do you have any difficulty performing the following activities: 06/28/2019  Hearing? N  Vision? N  Difficulty concentrating or making decisions? Y  Comment sometimes forgetful  Walking or climbing stairs? N  Dressing or bathing?  Y  Comment due to loss of range of motion with shoulder pain  Doing errands, shopping? N  Preparing Food and eating ? N  Using the Toilet? N  In the past six months, have you accidently leaked urine? N  Do you have problems with loss of bowel control? N  Managing your Medications? N  Managing your Finances? N  Housekeeping or managing your Housekeeping? N  Some recent data might be hidden    Patient Education/ Literacy How often do you need to have someone help you when you read instructions, pamphlets, or other written materials from your doctor or pharmacy?: 1 - Never What is the last grade level you completed in school?: 12+  Exercise Current Exercise Habits: The patient does not participate in regular exercise at present, Exercise limited by: orthopedic condition(s)(bilateral shoulder pain)  Diet Patient reports consuming 2 meals a day and 1 snack(s) a day Patient reports that her primary diet is: Regular Patient reports that she does have regular access to food.   Depression Screen PHQ 2/9 Scores 06/28/2019 06/08/2019 04/26/2019  PHQ - 2 Score 0 0 0     Fall Risk Fall Risk  06/28/2019 06/08/2019 06/08/2019 04/26/2019   Falls in the past year? 1 1 0 0  Number falls in past yr: 0 0 - -  Injury with Fall? 0 0 - -  Risk for fall due to : History of fall(s) No Fall Risks - -  Follow up Falls prevention discussed Falls prevention discussed - -     Objective:  Tallula C Gilbert seemed alert and oriented and she participated appropriately during our telephone visit.  Blood Pressure Weight BMI  BP Readings from Last 3 Encounters:  06/24/19 (!) 138/93  06/08/19 117/76  04/26/19 (!) 141/101   Wt Readings from Last 3 Encounters:  06/24/19 205 lb 9.6 oz (93.3 kg)  06/08/19 206 lb 3.2 oz (93.5 kg)  04/26/19 210 lb 12.8 oz (95.6 kg)   BMI Readings from Last 1 Encounters:  06/24/19 35.29 kg/m    *Unable to obtain current vital signs, weight, and BMI due to telephone visit type  Hearing/Vision  . Madilynne did not seem to have difficulty with hearing/understanding during the telephone conversation . Reports that she has not had a formal eye exam by an eye care professional within the past year . Reports that she has not had a formal hearing evaluation within the past year *Unable to fully assess hearing and vision during telephone visit type  Cognitive Function: 6CIT Screen 06/28/2019  What Year? 0 points  What month? 0 points  What time? 0 points  Count back from 20 0 points  Months in reverse 0 points  Repeat phrase 0 points  Total Score 0   (Normal:0-7, Significant for Dysfunction: >8)  Normal Cognitive Function Screening: Yes   Immunization & Health Maintenance Record Immunization History  Administered Date(s) Administered  . Influenza,inj,Quad PF,6+ Mos 11/27/2012  . Influenza-Unspecified 12/16/2013, 12/28/2014  . Moderna SARS-COVID-2 Vaccination 05/07/2019, 06/09/2019    Health Maintenance  Topic Date Due  . PNEUMOCOCCAL POLYSACCHARIDE VACCINE AGE 66-64 HIGH RISK  Never done  . OPHTHALMOLOGY EXAM  Never done  . TETANUS/TDAP  Never done  . PAP SMEAR-Modifier  Never done  . COLONOSCOPY  Never  done  . INFLUENZA VACCINE  09/12/2019  . HEMOGLOBIN A1C  11/17/2019  . FOOT EXAM  06/07/2020  . URINE MICROALBUMIN  06/07/2020  . COVID-19 Vaccine  Completed  . Hepatitis C Screening  Discontinued  . HIV Screening  Discontinued       Assessment  This is a routine wellness examination for Wendy Gilbert.  Health Maintenance: Due or Overdue Health Maintenance Due  Topic Date Due  . PNEUMOCOCCAL POLYSACCHARIDE VACCINE AGE 40-64 HIGH RISK  Never done  . OPHTHALMOLOGY EXAM  Never done  . TETANUS/TDAP  Never done  . PAP SMEAR-Modifier  Never done  . COLONOSCOPY  Never done    Wendy Gilbert does not need a referral for Community Assistance: Care Management:   no Social Work:    no Prescription Assistance:  no Nutrition/Diabetes Education:  no   Plan:  Personalized Goals Goals Addressed            This Visit's Progress   . Patient Stated       06/28/2019 AWV Goal: Keep All Scheduled Appointments  Over the next year, patient will attend all scheduled appointments with their PCP and any specialists that they see.       Personalized Health Maintenance & Screening Recommendations  Colorectal cancer screening, pap smear, eye exam  Lung Cancer Screening Recommended: no (Low Dose CT Chest recommended if Age 82-80 years, 30 pack-year currently smoking OR have quit w/in past 15 years) Hepatitis C Screening recommended: no HIV Screening recommended: no  Advanced Directives: Written information was not prepared per patient's request.  Referrals & Orders No orders of the defined types were placed in this encounter.   Follow-up Plan . Follow-up with Wendy Norlander, DO as planned . Schedule colonoscopy, pap smear, eye exam .    I have personally reviewed and noted the following in the patient's chart:   . Medical and social history . Use of alcohol, tobacco or illicit drugs  . Current medications and supplements . Functional ability and status . Nutritional  status . Physical activity . Advanced directives . List of other physicians . Hospitalizations, surgeries, and ER visits in previous 12 months . Vitals . Screenings to include cognitive, depression, and falls . Referrals and appointments  In addition, I have reviewed and discussed with Wendy Gilbert certain preventive protocols, quality metrics, and best practice recommendations. A written personalized care plan for preventive services as well as general preventive health recommendations is available and can be mailed to the patient at her request.      Baldomero Lamy, LPN  [phone]

## 2019-07-06 DIAGNOSIS — M79671 Pain in right foot: Secondary | ICD-10-CM | POA: Diagnosis not present

## 2019-07-06 DIAGNOSIS — E1140 Type 2 diabetes mellitus with diabetic neuropathy, unspecified: Secondary | ICD-10-CM | POA: Diagnosis not present

## 2019-07-06 DIAGNOSIS — M1990 Unspecified osteoarthritis, unspecified site: Secondary | ICD-10-CM | POA: Diagnosis not present

## 2019-07-06 DIAGNOSIS — M25579 Pain in unspecified ankle and joints of unspecified foot: Secondary | ICD-10-CM | POA: Diagnosis not present

## 2019-07-06 DIAGNOSIS — M79672 Pain in left foot: Secondary | ICD-10-CM | POA: Diagnosis not present

## 2019-07-29 DIAGNOSIS — N189 Chronic kidney disease, unspecified: Secondary | ICD-10-CM | POA: Diagnosis not present

## 2019-07-29 DIAGNOSIS — Z79899 Other long term (current) drug therapy: Secondary | ICD-10-CM | POA: Diagnosis not present

## 2019-07-29 DIAGNOSIS — I129 Hypertensive chronic kidney disease with stage 1 through stage 4 chronic kidney disease, or unspecified chronic kidney disease: Secondary | ICD-10-CM | POA: Diagnosis not present

## 2019-07-29 DIAGNOSIS — E559 Vitamin D deficiency, unspecified: Secondary | ICD-10-CM | POA: Diagnosis not present

## 2019-08-12 ENCOUNTER — Other Ambulatory Visit: Payer: Self-pay

## 2019-08-12 ENCOUNTER — Encounter: Payer: Medicare Other | Attending: Family Medicine | Admitting: Nutrition

## 2019-08-12 VITALS — Ht 64.0 in | Wt 203.0 lb

## 2019-08-12 DIAGNOSIS — E119 Type 2 diabetes mellitus without complications: Secondary | ICD-10-CM

## 2019-08-12 DIAGNOSIS — E669 Obesity, unspecified: Secondary | ICD-10-CM | POA: Diagnosis present

## 2019-08-12 DIAGNOSIS — I10 Essential (primary) hypertension: Secondary | ICD-10-CM | POA: Diagnosis not present

## 2019-08-12 NOTE — Patient Instructions (Addendum)
  Goals  Follow MY Plate Eat 2 carb choices per meal Cut out sweets Drink only water Eat meals at time. Walk 10 minutes a day, Test blood sugars once a day.

## 2019-08-12 NOTE — Progress Notes (Signed)
  Medical Nutrition Therapy:  Appt start time: 1330 end time:  1430.   Assessment:  Primary concerns today: Diabetes Type 2, Dx 2 months ago. PMH Breast cancer, blood clot, HTN, obesity. Married and lives with her  Husband and daughter. .  She cook and shops. Eats 2-3 meals per day. She has been working on cutting out sweets.   No meter or testing supplies. Has stg 2 CKD. Dr. Theador Hawthorne, Nephrologist.  Willing to work on making changes with diet and exerise to improve her DM and CKD and reduce complications. Diet is high in processed meats, fast, sodium and low in fresh fruits and vegetables. Lab Results  Component Value Date   HGBA1C 7.0 (H) 05/18/2019    CMP Latest Ref Rng & Units 06/17/2019 06/11/2019 06/08/2019  Glucose 70 - 99 mg/dL 125(H) 200(H) 119(H)  BUN 6 - 20 mg/dL 23(H) 15 22  Creatinine 0.44 - 1.00 mg/dL 1.03(H) 1.20(H) 1.31(H)  Sodium 135 - 145 mmol/L 141 139 141  Potassium 3.5 - 5.1 mmol/L 3.7 3.7 4.1  Chloride 98 - 111 mmol/L 103 98 100  CO2 22 - 32 mmol/L 28 23 20   Calcium 8.9 - 10.3 mg/dL 9.3 9.4 9.8  Total Protein 6.5 - 8.1 g/dL 8.0 7.7 -  Total Bilirubin 0.3 - 1.2 mg/dL 0.5 0.3 -  Alkaline Phos 38 - 126 U/L 63 80 -  AST 15 - 41 U/L 25 37 -  ALT 0 - 44 U/L 25 26 -      Preferred Learning Style:  No preference indicated   Learning Readiness:    Ready  Change in progress   MEDICATIONS:    DIETARY INTAKE:   24-hr recall:  B ( AM): smoked sausage , 2 slice honey wheat toast, body armour, Snk ( AM):  L ( PM): skips if breakfast Snk ( PM): D ( PM): steak and baked potato and 2 ears of corn on cob. Lemonade,  Snk ( PM):  Beverages: lemonade, water, body armour-gatorade.  Usual physical activity: ADL  Estimated energy needs: 1200 calories  135g carbohydrates 90 g protein 33 g fat  Progress Towards Goal(s):  In progress.   Nutritional Diagnosis:  NB-1.1 Food and nutrition-related knowledge deficit As related to Diabetes Type 2.  As evidenced by  A1C .    Intervention:  Nutrition and Diabetes education provided on My Plate, CHO counting, meal planning, portion sizes, timing of meals, avoiding snacks between meals unless having a low blood sugar, target ranges for A1C and blood sugars, signs/symptoms and treatment of hyper/hypoglycemia, monitoring blood sugars, taking medications as prescribed, benefits of exercising 30 minutes per day and prevention of complications of DM.  Goals  Follow MY Plate Eat 2 carb choices per meal Cut out sweets Drink only water Eat meals at time. Walk 10 minutes  a day, Test blood sugars once a day.  Teaching Method Utilized:  Visual Auditory Hands on  Handouts given during visit include:  The Plate Method  Meal Plan Card  Diabetes instructions.   Barriers to learning/adherence to lifestyle change: none  Demonstrated degree of understanding via:  Teach Back   Monitoring/Evaluation:  Dietary intake, exercise, , and body weight in 1 month(s). Would benefit from assessment of possible depression which is effecting her self care and management of her medical problems.

## 2019-08-25 ENCOUNTER — Other Ambulatory Visit: Payer: Medicare Other

## 2019-08-25 ENCOUNTER — Other Ambulatory Visit: Payer: Self-pay | Admitting: Family Medicine

## 2019-08-25 ENCOUNTER — Other Ambulatory Visit: Payer: Self-pay

## 2019-08-25 DIAGNOSIS — R809 Proteinuria, unspecified: Secondary | ICD-10-CM | POA: Diagnosis not present

## 2019-08-25 DIAGNOSIS — E119 Type 2 diabetes mellitus without complications: Secondary | ICD-10-CM

## 2019-08-25 DIAGNOSIS — Z1159 Encounter for screening for other viral diseases: Secondary | ICD-10-CM | POA: Diagnosis not present

## 2019-08-25 DIAGNOSIS — Z79899 Other long term (current) drug therapy: Secondary | ICD-10-CM | POA: Diagnosis not present

## 2019-08-25 DIAGNOSIS — D631 Anemia in chronic kidney disease: Secondary | ICD-10-CM | POA: Diagnosis not present

## 2019-08-25 DIAGNOSIS — I129 Hypertensive chronic kidney disease with stage 1 through stage 4 chronic kidney disease, or unspecified chronic kidney disease: Secondary | ICD-10-CM | POA: Diagnosis not present

## 2019-08-25 DIAGNOSIS — N189 Chronic kidney disease, unspecified: Secondary | ICD-10-CM | POA: Diagnosis not present

## 2019-08-25 DIAGNOSIS — E559 Vitamin D deficiency, unspecified: Secondary | ICD-10-CM | POA: Diagnosis not present

## 2019-08-25 DIAGNOSIS — E1122 Type 2 diabetes mellitus with diabetic chronic kidney disease: Secondary | ICD-10-CM | POA: Diagnosis not present

## 2019-08-25 MED ORDER — LANCET DEVICE MISC: 12 refills | Status: DC

## 2019-08-25 MED ORDER — GLUCOSE BLOOD VI STRP: ORAL_STRIP | 12 refills | Status: DC

## 2019-08-26 ENCOUNTER — Encounter: Payer: Self-pay | Admitting: Nutrition

## 2019-09-03 ENCOUNTER — Other Ambulatory Visit (HOSPITAL_COMMUNITY): Payer: Self-pay | Admitting: Nephrology

## 2019-09-03 ENCOUNTER — Other Ambulatory Visit: Payer: Self-pay | Admitting: Nephrology

## 2019-09-03 DIAGNOSIS — N189 Chronic kidney disease, unspecified: Secondary | ICD-10-CM

## 2019-09-03 DIAGNOSIS — E1122 Type 2 diabetes mellitus with diabetic chronic kidney disease: Secondary | ICD-10-CM

## 2019-09-07 ENCOUNTER — Ambulatory Visit (HOSPITAL_COMMUNITY)
Admission: RE | Admit: 2019-09-07 | Discharge: 2019-09-07 | Disposition: A | Discharge status: Home / Self Care | Payer: Medicare Other | Source: Ambulatory Visit | Attending: Nephrology | Admitting: Nephrology

## 2019-09-07 ENCOUNTER — Other Ambulatory Visit: Payer: Self-pay

## 2019-09-07 DIAGNOSIS — N181 Chronic kidney disease, stage 1: Secondary | ICD-10-CM | POA: Diagnosis not present

## 2019-09-07 DIAGNOSIS — E1122 Type 2 diabetes mellitus with diabetic chronic kidney disease: Secondary | ICD-10-CM

## 2019-09-07 DIAGNOSIS — N189 Chronic kidney disease, unspecified: Secondary | ICD-10-CM

## 2019-09-09 DIAGNOSIS — N170 Acute kidney failure with tubular necrosis: Secondary | ICD-10-CM | POA: Diagnosis not present

## 2019-09-09 DIAGNOSIS — E611 Iron deficiency: Secondary | ICD-10-CM | POA: Diagnosis not present

## 2019-09-09 DIAGNOSIS — N182 Chronic kidney disease, stage 2 (mild): Secondary | ICD-10-CM | POA: Diagnosis not present

## 2019-09-09 DIAGNOSIS — I129 Hypertensive chronic kidney disease with stage 1 through stage 4 chronic kidney disease, or unspecified chronic kidney disease: Secondary | ICD-10-CM | POA: Diagnosis not present

## 2019-09-17 DIAGNOSIS — Z0100 Encounter for examination of eyes and vision without abnormal findings: Secondary | ICD-10-CM | POA: Diagnosis not present

## 2019-09-17 DIAGNOSIS — E119 Type 2 diabetes mellitus without complications: Secondary | ICD-10-CM | POA: Diagnosis not present

## 2019-09-22 ENCOUNTER — Encounter: Payer: Self-pay | Admitting: Internal Medicine

## 2019-09-30 ENCOUNTER — Encounter: Payer: Self-pay | Admitting: Genetic Counselor

## 2019-10-02 ENCOUNTER — Other Ambulatory Visit: Payer: Self-pay | Admitting: Family Medicine

## 2019-10-02 DIAGNOSIS — K219 Gastro-esophageal reflux disease without esophagitis: Secondary | ICD-10-CM

## 2019-10-07 ENCOUNTER — Ambulatory Visit: Payer: Medicare Other | Admitting: Nutrition

## 2019-10-27 ENCOUNTER — Ambulatory Visit: Payer: Medicare Other | Admitting: Internal Medicine

## 2019-11-01 ENCOUNTER — Encounter: Payer: Self-pay | Admitting: Dietician

## 2019-11-01 ENCOUNTER — Other Ambulatory Visit: Payer: Self-pay

## 2019-11-01 ENCOUNTER — Encounter: Payer: Medicare Other | Attending: Family Medicine | Admitting: Dietician

## 2019-11-01 VITALS — Ht 64.0 in | Wt 192.2 lb

## 2019-11-01 DIAGNOSIS — N183 Chronic kidney disease, stage 3 unspecified: Secondary | ICD-10-CM | POA: Diagnosis not present

## 2019-11-01 DIAGNOSIS — E119 Type 2 diabetes mellitus without complications: Secondary | ICD-10-CM | POA: Insufficient documentation

## 2019-11-01 NOTE — Progress Notes (Signed)
Medical Nutrition Therapy:  Appt start time: 9357 end time:  1520.  Assessment:  Primary concerns today: Diabetes Type 2, Dx 2 months ago.   Pt states she has given up sweet tea, and "all the good stuff" in her diet.  Pt had glucose meter, and was able to show her BG values for the last month. Ranged from low 100's to a max of 145. Pt only checks BG fasting in the morning. Reports a 4 day stretch of 140 mg/dL readings in the morning, attributes it to dehydration. Drank a large bottle of alkaline water and stated it helped to bring her values back into range. Pt is eating meals at proper intervals, but not eating breakfast for at least 2 hours after waking up. Pt got a new Apple watch that has been helping her track her activity. Reports sporadic physical activity.   A1c   7.0 05/18/2019   6.5 08/25/2019  Lab Results  Component Value Date   HGBA1C 7.0 (H) 05/18/2019    CMP Latest Ref Rng & Units 06/17/2019 06/11/2019 06/08/2019  Glucose 70 - 99 mg/dL 125(H) 200(H) 119(H)  BUN 6 - 20 mg/dL 23(H) 15 22  Creatinine 0.44 - 1.00 mg/dL 1.03(H) 1.20(H) 1.31(H)  Sodium 135 - 145 mmol/L 141 139 141  Potassium 3.5 - 5.1 mmol/L 3.7 3.7 4.1  Chloride 98 - 111 mmol/L 103 98 100  CO2 22 - 32 mmol/L 28 23 20   Calcium 8.9 - 10.3 mg/dL 9.3 9.4 9.8  Total Protein 6.5 - 8.1 g/dL 8.0 7.7 -  Total Bilirubin 0.3 - 1.2 mg/dL 0.5 0.3 -  Alkaline Phos 38 - 126 U/L 63 80 -  AST 15 - 41 U/L 25 37 -  ALT 0 - 44 U/L 25 26 -      Preferred Learning Style:  No preference indicated   Learning Readiness:    Ready  Change in progress   MEDICATIONS: Protonix   DIETARY INTAKE:   24-hr recall:  B ( 12:30 PM): 2 smoked sausage pancake corndogs, lipton sugar free peach iced tea. Snk ( AM): none L ( PM): none Snk ( PM):none D ( 5 PM): Spaghetti with meat sauce, and onions, salad, water  Snk (11 PM): Baked sweet potato Beverages:Peach iced tea, water  Usual physical activity: ADL  Estimated energy  needs: 1200 calories 135g carbohydrates 90 g protein 33 g fat  Progress Towards Goal(s):  In progress.   Nutritional Diagnosis:  NB-1.1 Food and nutrition-related knowledge deficit As related to Diabetes Type 2.  As evidenced by A1C .    Intervention:  Nutrition and Diabetes education provided on My Plate, CHO counting, meal planning, portion sizes, timing of meals, avoiding snacks between meals unless having a low blood sugar, target ranges for A1C and blood sugars, signs/symptoms and treatment of hyper/hypoglycemia, monitoring blood sugars, taking medications as prescribed, benefits of exercising 30 minutes per day and prevention of complications of DM.  Goals   Eat your first meal within an hour of waking up!  Have a yogurt and fruit for breakfast.  3 meals, about 5 hours apart. Try to get the balance of the Plate method as much as possible!  Check back in when you get your next lab work done!  Keep up the great work!    Teaching Method Utilized:  Visual Auditory Hands on    Barriers to learning/adherence to lifestyle change: none  Demonstrated degree of understanding via:  Teach Back   Monitoring/Evaluation:  Dietary intake,  exercise, , and body weight prn  . Would benefit from assessment of possible depression which is effecting her self care and management of her medical problems.

## 2019-11-01 NOTE — Patient Instructions (Addendum)
Eat your first meal within an hour of waking up!  Have a yogurt and fruit for breakfast.  3 meals, about 5 hours apart. Try to get the balance of the Plate method as much as possible!  Check back in when you get your next lab work done!  Keep up the great work!

## 2019-11-02 ENCOUNTER — Other Ambulatory Visit: Payer: Self-pay | Admitting: Family Medicine

## 2019-11-02 DIAGNOSIS — K219 Gastro-esophageal reflux disease without esophagitis: Secondary | ICD-10-CM

## 2019-11-25 ENCOUNTER — Ambulatory Visit: Payer: Medicare Other | Admitting: Internal Medicine

## 2019-11-25 ENCOUNTER — Telehealth: Payer: Self-pay | Admitting: *Deleted

## 2019-11-25 ENCOUNTER — Other Ambulatory Visit: Payer: Self-pay

## 2019-11-25 ENCOUNTER — Encounter: Payer: Self-pay | Admitting: Internal Medicine

## 2019-11-25 VITALS — BP 122/87 | HR 105 | Temp 97.5°F | Ht 64.0 in | Wt 190.0 lb

## 2019-11-25 DIAGNOSIS — K2100 Gastro-esophageal reflux disease with esophagitis, without bleeding: Secondary | ICD-10-CM | POA: Diagnosis not present

## 2019-11-25 DIAGNOSIS — D509 Iron deficiency anemia, unspecified: Secondary | ICD-10-CM | POA: Diagnosis not present

## 2019-11-25 NOTE — Telephone Encounter (Signed)
Patient returned call. She has been scheduled for procedure on 11/1 at 12:30pm. Aware will need covid test prior. Advised will mail instructions with pre-op appt. Confirmed mailing address.   PA approved for TCS/EGD via [facility] website. auth # [id number] dates 12/13/2019-02/11/2020

## 2019-11-25 NOTE — H&P (View-Only) (Signed)
Primary Care Physician:  Janora Norlander, DO Primary Gastroenterologist:  Dr. Abbey Chatters  Chief Complaint  Patient presents with  . Anemia    prescribed iron TID but only taskes about 1-2 times per day    HPI:   Wendy Gilbert is a 59 y.o. female who presents to the clinic today by referral from her nephrologist for evaluation for iron deficiency anemia.  Patient has mild chronic kidney disease.  During her work-up she was found to be slightly anemic with iron deficiency.  She is not currently on iron supplementation which she states she tries to take as prescribed.  Supposed to take 3 times a day but usually only takes once or twice.  No gross GI bleeding that she is seen.  Does have chronic reflux which she states is well maintained on pantoprazole 40 mg daily.  No overt dysphagia odynophagia.  No history of chronic NSAID use.  No history of PUD or H. pylori.  No previous colonoscopy or upper endoscopy.  No family history of colorectal malignancy.  Otherwise she has no other complaints.  Past Medical History:  Diagnosis Date  . Arthritis    "all over"  . Diabetes mellitus without complication ([place])   . DVT (deep venous thrombosis) ([place]) 10/2008   left arm  . GERD (gastroesophageal reflux disease)   . Headache(784.0)    sinus  . 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 PONV (postoperative nausea and vomiting)   . Seasonal allergies   . Sinusitis   . Vitamin D deficiency 04/13/2014    Past Surgical History:  Procedure Laterality Date  . CESAREAN SECTION  09/1999  . DILATION AND CURETTAGE OF UTERUS  1978  . INCISION AND DRAINAGE ABSCESS  10/25/2008   and debridement left axillary abscess, abd. wall abscess, left thigh abscess  . KNEE ARTHROSCOPY Left 11/1996  . MASTECTOMY Bilateral 02/23/2009   right modified radical, left simple  . MASTECTOMY  2011    bilateral   . PORT-A-CATH REMOVAL Left 05/07/2012   Procedure: REMOVAL PORT-A-CATH;  Surgeon: Odis Hollingshead, MD;   Location: [place] [facility];  Service: General;  Laterality: Left;  . PORTACATH PLACEMENT  08/09/2008  . TOTAL KNEE ARTHROPLASTY Left 08/10/2013   Procedure: LEFT TOTAL KNEE ARTHROPLASTY;  Surgeon: Tobi Bastos, MD;  Location: [facility];  Service: Orthopedics;  Laterality: Left;  . TUBAL LIGATION      Current Outpatient Medications  Medication Sig Dispense Refill  . Acetaminophen (TYLENOL EX ST ARTHRITIS PAIN PO) Take by mouth as needed.     . ferrous sulfate 325 (65 FE) MG tablet Take 325 mg by mouth. 1-2 times per day    . fluticasone (FLONASE) 50 MCG/ACT nasal spray Place into both nostrils daily. As needed    . Magnesium 500 MG CAPS Take 2 capsules by mouth daily.    . pantoprazole (PROTONIX) 40 MG tablet Take 1 tablet (40 mg total) by mouth daily. (Needs to be seen before next refill) 30 tablet 0  . triamterene-hydrochlorothiazide (MAXZIDE) 75-50 MG tablet Take 1 tablet by mouth daily. 90 tablet 1   No current facility-administered medications for this visit.    Allergies as of 11/25/2019 - Review Complete 11/25/2019  Allergen Reaction Noted  . Demerol Other (See Comments) 07/31/2010  . Erythromycin Nausea And Vomiting 07/31/2010  . Adhesive [tape] Rash 05/01/2012  . Cephalexin Hives and Other (See Comments) 07/31/2010  . Penicillins Other (See Comments) 07/31/2010  Family History  Problem Relation Age of Onset  . Breast cancer Mother   . Cancer Mother        breast, colon, ovarian  . Heart disease Father   . Cancer Paternal Uncle        pancreatic  . Deep vein thrombosis Brother   . Aortic aneurysm Brother   . ADD / ADHD Daughter   . Asthma Daughter   . Diabetes Maternal Grandmother   . Stroke Maternal Grandmother   . Hypothyroidism Maternal Grandfather   . Heart disease Maternal Grandfather   . Anuerysm Paternal Grandmother   . Heart attack Paternal Grandfather     Social History   Socioeconomic History  . Marital status: Married    Spouse name:  Ayesha Rumpf  . Number of children: 2  . Years of education: 72  . Highest education level: Some college, no degree  Occupational History  . Occupation: disabled  Tobacco Use  . Smoking status: Never Smoker  . Smokeless tobacco: Never Used  Vaping Use  . Vaping Use: Never used  Substance and Sexual Activity  . Alcohol use: No  . Drug use: No  . Sexual activity: Not Currently  Other Topics Concern  . Not on file  Social History Narrative  . Not on file   Social Determinants of Health   Financial Resource Strain:   . Difficulty of Paying Living Expenses: Not on file  Food Insecurity:   . Worried About Charity fundraiser in the Last Year: Not on file  . Ran Out of Food in the Last Year: Not on file  Transportation Needs:   . Lack of Transportation (Medical): Not on file  . Lack of Transportation (Non-Medical): Not on file  Physical Activity:   . Days of Exercise per Week: Not on file  . Minutes of Exercise per Session: Not on file  Stress:   . Feeling of Stress : Not on file  Social Connections:   . Frequency of Communication with Friends and Family: Not on file  . Frequency of Social Gatherings with Friends and Family: Not on file  . Attends Religious Services: Not on file  . Active Member of Clubs or Organizations: Not on file  . Attends Archivist Meetings: Not on file  . Marital Status: Not on file  Intimate Partner Violence:   . Fear of Current or Ex-Partner: Not on file  . Emotionally Abused: Not on file  . Physically Abused: Not on file  . Sexually Abused: Not on file    Subjective: Review of Systems  Constitutional: Negative for chills and fever.  HENT: Negative for congestion and hearing loss.   Eyes: Negative for blurred vision and double vision.  Respiratory: Negative for cough and shortness of breath.   Cardiovascular: Negative for chest pain and palpitations.  Gastrointestinal: Negative for abdominal pain, blood in stool, constipation, diarrhea,  heartburn, melena and vomiting.  Genitourinary: Negative for dysuria and urgency.  Musculoskeletal: Negative for joint pain and myalgias.  Skin: Negative for itching and rash.  Neurological: Negative for dizziness and headaches.  Psychiatric/Behavioral: Negative for depression. The patient is not nervous/anxious.        Objective: BP 122/87   Pulse (!) 105   Temp (!) 97.5 F (36.4 C) (Oral)   Ht 5\' 4"  (1.626 m)   Wt 190 lb (86.2 kg)   BMI 32.61 kg/m  Physical Exam Constitutional:      Appearance: Normal appearance.  HENT:  Head: Normocephalic and atraumatic.  Eyes:     Extraocular Movements: Extraocular movements intact.     Conjunctiva/sclera: Conjunctivae normal.  Cardiovascular:     Rate and Rhythm: Normal rate and regular rhythm.  Pulmonary:     Effort: Pulmonary effort is normal.     Breath sounds: Normal breath sounds.  Abdominal:     General: Bowel sounds are normal.     Palpations: Abdomen is soft.  Musculoskeletal:        General: No swelling. Normal range of motion.     Cervical back: Normal range of motion and neck supple.  Skin:    General: Skin is warm and dry.     Coloration: Skin is not jaundiced.  Neurological:     General: No focal deficit present.     Mental Status: She is alert and oriented to person, place, and time.  Psychiatric:        Mood and Affect: Mood normal.        Behavior: Behavior normal.      Assessment: *Iron deficiency anemia *Chronic reflux-well controlled on Protonix  Plan: To further evaluate patient's iron deficiency anemia, Will schedule for EGD to evaluate for peptic ulcer disease, esophagitis, gastritis, H. Pylori, duodenitis, or other. Will also evaluate for esophageal stricture, Schatzki's ring, esophageal web or other.   At the same time we will perform colonoscopy to rule out occult GI blood loss from polyps, malignancy, AVMs, diverticulosis, hemorrhoids, or other.  The risks including infection, bleed, or  perforation as well as benefits, limitations, alternatives and imponderables have been reviewed with the patient. Potential for esophageal dilation, biopsy, etc. have also been reviewed.  Questions have been answered. All parties agreeable.  Continue on Protonix daily for chronic reflux.  We may adjust this pending endoscopic evaluation.  Thank you Dr. Theador Hawthorne for the kind referral.    11/25/2019 2:01 PM   Disclaimer: This note was dictated with voice recognition software. Similar sounding words can inadvertently be transcribed and may not be corrected upon review.

## 2019-11-25 NOTE — Progress Notes (Signed)
Primary Care Physician:  Janora Norlander, DO Primary Gastroenterologist:  Dr. Abbey Chatters  Chief Complaint  Patient presents with  . Anemia    prescribed iron TID but only taskes about 1-2 times per day    HPI:   Wendy Gilbert is a 59 y.o. female who presents to the clinic today by referral from her nephrologist for evaluation for iron deficiency anemia.  Patient has mild chronic kidney disease.  During her work-up she was found to be slightly anemic with iron deficiency.  She is not currently on iron supplementation which she states she tries to take as prescribed.  Supposed to take 3 times a day but usually only takes once or twice.  No gross GI bleeding that she is seen.  Does have chronic reflux which she states is well maintained on pantoprazole 40 mg daily.  No overt dysphagia odynophagia.  No history of chronic NSAID use.  No history of PUD or H. pylori.  No previous colonoscopy or upper endoscopy.  No family history of colorectal malignancy.  Otherwise she has no other complaints.  Past Medical History:  Diagnosis Date  . Arthritis    "all over"  . Diabetes mellitus without complication ([place])   . DVT (deep venous thrombosis) ([place]) 10/2008   left arm  . GERD (gastroesophageal reflux disease)   . Headache(784.0)    sinus  . 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 PONV (postoperative nausea and vomiting)   . Seasonal allergies   . Sinusitis   . Vitamin D deficiency 04/13/2014    Past Surgical History:  Procedure Laterality Date  . CESAREAN SECTION  09/1999  . DILATION AND CURETTAGE OF UTERUS  1978  . INCISION AND DRAINAGE ABSCESS  10/25/2008   and debridement left axillary abscess, abd. wall abscess, left thigh abscess  . KNEE ARTHROSCOPY Left 11/1996  . MASTECTOMY Bilateral 02/23/2009   right modified radical, left simple  . MASTECTOMY  2011    bilateral   . PORT-A-CATH REMOVAL Left 05/07/2012   Procedure: REMOVAL PORT-A-CATH;  Surgeon: Odis Hollingshead, MD;   Location: [facility];  Service: General;  Laterality: Left;  . PORTACATH PLACEMENT  08/09/2008  . TOTAL KNEE ARTHROPLASTY Left 08/10/2013   Procedure: LEFT TOTAL KNEE ARTHROPLASTY;  Surgeon: Tobi Bastos, MD;  Location: [facility];  Service: Orthopedics;  Laterality: Left;  . TUBAL LIGATION      Current Outpatient Medications  Medication Sig Dispense Refill  . Acetaminophen (TYLENOL EX ST ARTHRITIS PAIN PO) Take by mouth as needed.     . ferrous sulfate 325 (65 FE) MG tablet Take 325 mg by mouth. 1-2 times per day    . fluticasone (FLONASE) 50 MCG/ACT nasal spray Place into both nostrils daily. As needed    . Magnesium 500 MG CAPS Take 2 capsules by mouth daily.    . pantoprazole (PROTONIX) 40 MG tablet Take 1 tablet (40 mg total) by mouth daily. (Needs to be seen before next refill) 30 tablet 0  . triamterene-hydrochlorothiazide (MAXZIDE) 75-50 MG tablet Take 1 tablet by mouth daily. 90 tablet 1   No current facility-administered medications for this visit.    Allergies as of 11/25/2019 - Review Complete 11/25/2019  Allergen Reaction Noted  . Demerol Other (See Comments) 07/31/2010  . Erythromycin Nausea And Vomiting 07/31/2010  . Adhesive [tape] Rash 05/01/2012  . Cephalexin Hives and Other (See Comments) 07/31/2010  . Penicillins Other (See Comments) 07/31/2010  Family History  Problem Relation Age of Onset  . Breast cancer Mother   . Cancer Mother        breast, colon, ovarian  . Heart disease Father   . Cancer Paternal Uncle        pancreatic  . Deep vein thrombosis Brother   . Aortic aneurysm Brother   . ADD / ADHD Daughter   . Asthma Daughter   . Diabetes Maternal Grandmother   . Stroke Maternal Grandmother   . Hypothyroidism Maternal Grandfather   . Heart disease Maternal Grandfather   . Anuerysm Paternal Grandmother   . Heart attack Paternal Grandfather     Social History   Socioeconomic History  . Marital status: Married    Spouse name:  Ayesha Rumpf  . Number of children: 2  . Years of education: 11  . Highest education level: Some college, no degree  Occupational History  . Occupation: disabled  Tobacco Use  . Smoking status: Never Smoker  . Smokeless tobacco: Never Used  Vaping Use  . Vaping Use: Never used  Substance and Sexual Activity  . Alcohol use: No  . Drug use: No  . Sexual activity: Not Currently  Other Topics Concern  . Not on file  Social History Narrative  . Not on file   Social Determinants of Health   Financial Resource Strain:   . Difficulty of Paying Living Expenses: Not on file  Food Insecurity:   . Worried About Charity fundraiser in the Last Year: Not on file  . Ran Out of Food in the Last Year: Not on file  Transportation Needs:   . Lack of Transportation (Medical): Not on file  . Lack of Transportation (Non-Medical): Not on file  Physical Activity:   . Days of Exercise per Week: Not on file  . Minutes of Exercise per Session: Not on file  Stress:   . Feeling of Stress : Not on file  Social Connections:   . Frequency of Communication with Friends and Family: Not on file  . Frequency of Social Gatherings with Friends and Family: Not on file  . Attends Religious Services: Not on file  . Active Member of Clubs or Organizations: Not on file  . Attends Archivist Meetings: Not on file  . Marital Status: Not on file  Intimate Partner Violence:   . Fear of Current or Ex-Partner: Not on file  . Emotionally Abused: Not on file  . Physically Abused: Not on file  . Sexually Abused: Not on file    Subjective: Review of Systems  Constitutional: Negative for chills and fever.  HENT: Negative for congestion and hearing loss.   Eyes: Negative for blurred vision and double vision.  Respiratory: Negative for cough and shortness of breath.   Cardiovascular: Negative for chest pain and palpitations.  Gastrointestinal: Negative for abdominal pain, blood in stool, constipation, diarrhea,  heartburn, melena and vomiting.  Genitourinary: Negative for dysuria and urgency.  Musculoskeletal: Negative for joint pain and myalgias.  Skin: Negative for itching and rash.  Neurological: Negative for dizziness and headaches.  Psychiatric/Behavioral: Negative for depression. The patient is not nervous/anxious.        Objective: BP 122/87   Pulse (!) 105   Temp (!) 97.5 F (36.4 C) (Oral)   Ht 5\' 4"  (1.626 m)   Wt 190 lb (86.2 kg)   BMI 32.61 kg/m  Physical Exam Constitutional:      Appearance: Normal appearance.  HENT:  Head: Normocephalic and atraumatic.  Eyes:     Extraocular Movements: Extraocular movements intact.     Conjunctiva/sclera: Conjunctivae normal.  Cardiovascular:     Rate and Rhythm: Normal rate and regular rhythm.  Pulmonary:     Effort: Pulmonary effort is normal.     Breath sounds: Normal breath sounds.  Abdominal:     General: Bowel sounds are normal.     Palpations: Abdomen is soft.  Musculoskeletal:        General: No swelling. Normal range of motion.     Cervical back: Normal range of motion and neck supple.  Skin:    General: Skin is warm and dry.     Coloration: Skin is not jaundiced.  Neurological:     General: No focal deficit present.     Mental Status: She is alert and oriented to person, place, and time.  Psychiatric:        Mood and Affect: Mood normal.        Behavior: Behavior normal.      Assessment: *Iron deficiency anemia *Chronic reflux-well controlled on Protonix  Plan: To further evaluate patient's iron deficiency anemia, Will schedule for EGD to evaluate for peptic ulcer disease, esophagitis, gastritis, H. Pylori, duodenitis, or other. Will also evaluate for esophageal stricture, Schatzki's ring, esophageal web or other.   At the same time we will perform colonoscopy to rule out occult GI blood loss from polyps, malignancy, AVMs, diverticulosis, hemorrhoids, or other.  The risks including infection, bleed, or  perforation as well as benefits, limitations, alternatives and imponderables have been reviewed with the patient. Potential for esophageal dilation, biopsy, etc. have also been reviewed.  Questions have been answered. All parties agreeable.  Continue on Protonix daily for chronic reflux.  We may adjust this pending endoscopic evaluation.  Thank you Dr. Theador Hawthorne for the kind referral.    11/25/2019 2:01 PM   Disclaimer: This note was dictated with voice recognition software. Similar sounding words can inadvertently be transcribed and may not be corrected upon review.

## 2019-11-25 NOTE — Telephone Encounter (Signed)
LMOVM to call back to schedule TCS/EGD, Dr. Scharlene Gloss, asa 2

## 2019-11-25 NOTE — Patient Instructions (Signed)
We will schedule you for EGD and colonoscopy to further evaluate your iron deficiency anemia.  Further recommendations to follow.  Continue iron supplementation.  Continue on daily Protonix, we may adjust this pending endoscopic evaluation.  At [facility] we value your feedback. You may receive a survey about your visit today. Please share your experience as we strive to create trusting relationships with our patients to provide genuine, compassionate, quality care.  We appreciate your understanding and patience as we review any laboratory studies, imaging, and other diagnostic tests that are ordered as we care for you. Our office policy is 5 business days for review of these results, and any emergent or urgent results are addressed in a timely manner for your best interest. If you do not hear from our office in 1 week, please contact us.   We also encourage the use of MyChart, which contains your medical information for your review as well. If you are not enrolled in this feature, an access code is on this after visit summary for your convenience. Thank you for allowing [place] to be involved in your care.  It was great to see you today!  I hope you have a great rest of your fall!!    Lakeshia Dohner K. Abbey Chatters, D.O. Gastroenterology and Hepatology [facility]

## 2019-12-01 ENCOUNTER — Telehealth: Payer: Self-pay | Admitting: Internal Medicine

## 2019-12-01 NOTE — Telephone Encounter (Signed)
PATIENT CALLED AND SAID THAT SHE IS DIABETIC

## 2019-12-01 NOTE — Telephone Encounter (Signed)
Called pt. She does not take medication for diabetes. Advised nothing further to be done

## 2019-12-06 ENCOUNTER — Other Ambulatory Visit: Payer: Self-pay | Admitting: Family Medicine

## 2019-12-06 DIAGNOSIS — K219 Gastro-esophageal reflux disease without esophagitis: Secondary | ICD-10-CM

## 2019-12-06 NOTE — Telephone Encounter (Signed)
Gottschalk. NTBS 30 days given 11/02/19

## 2019-12-10 ENCOUNTER — Other Ambulatory Visit: Payer: Self-pay

## 2019-12-10 ENCOUNTER — Other Ambulatory Visit (HOSPITAL_COMMUNITY)
Admission: RE | Admit: 2019-12-10 | Discharge: 2019-12-10 | Disposition: A | Discharge status: Home / Self Care | Payer: Medicare Other | Source: Ambulatory Visit | Attending: Hematology | Admitting: Hematology

## 2019-12-10 DIAGNOSIS — Z91048 Other nonmedicinal substance allergy status: Secondary | ICD-10-CM | POA: Diagnosis not present

## 2019-12-10 DIAGNOSIS — Z01812 Encounter for preprocedural laboratory examination: Secondary | ICD-10-CM | POA: Diagnosis not present

## 2019-12-10 DIAGNOSIS — Z8349 Family history of other endocrine, nutritional and metabolic diseases: Secondary | ICD-10-CM | POA: Diagnosis not present

## 2019-12-10 DIAGNOSIS — K317 Polyp of stomach and duodenum: Secondary | ICD-10-CM | POA: Diagnosis not present

## 2019-12-10 DIAGNOSIS — Z880 Allergy status to penicillin: Secondary | ICD-10-CM | POA: Diagnosis not present

## 2019-12-10 DIAGNOSIS — E1122 Type 2 diabetes mellitus with diabetic chronic kidney disease: Secondary | ICD-10-CM | POA: Diagnosis not present

## 2019-12-10 DIAGNOSIS — Z800 Family history of malignant neoplasm of digestive organs: Secondary | ICD-10-CM | POA: Diagnosis not present

## 2019-12-10 DIAGNOSIS — Z823 Family history of stroke: Secondary | ICD-10-CM | POA: Diagnosis not present

## 2019-12-10 DIAGNOSIS — Z888 Allergy status to other drugs, medicaments and biological substances status: Secondary | ICD-10-CM | POA: Diagnosis not present

## 2019-12-10 DIAGNOSIS — K449 Diaphragmatic hernia without obstruction or gangrene: Secondary | ICD-10-CM | POA: Diagnosis not present

## 2019-12-10 DIAGNOSIS — Z79899 Other long term (current) drug therapy: Secondary | ICD-10-CM | POA: Diagnosis not present

## 2019-12-10 DIAGNOSIS — K648 Other hemorrhoids: Secondary | ICD-10-CM | POA: Diagnosis not present

## 2019-12-10 DIAGNOSIS — N189 Chronic kidney disease, unspecified: Secondary | ICD-10-CM | POA: Diagnosis not present

## 2019-12-10 DIAGNOSIS — Z833 Family history of diabetes mellitus: Secondary | ICD-10-CM | POA: Diagnosis not present

## 2019-12-10 DIAGNOSIS — Z86718 Personal history of other venous thrombosis and embolism: Secondary | ICD-10-CM | POA: Diagnosis not present

## 2019-12-10 DIAGNOSIS — Z20822 Contact with and (suspected) exposure to covid-19: Secondary | ICD-10-CM | POA: Insufficient documentation

## 2019-12-10 DIAGNOSIS — Z8249 Family history of ischemic heart disease and other diseases of the circulatory system: Secondary | ICD-10-CM | POA: Diagnosis not present

## 2019-12-10 DIAGNOSIS — Z853 Personal history of malignant neoplasm of breast: Secondary | ICD-10-CM | POA: Diagnosis not present

## 2019-12-10 DIAGNOSIS — Z881 Allergy status to other antibiotic agents status: Secondary | ICD-10-CM | POA: Diagnosis not present

## 2019-12-10 DIAGNOSIS — D509 Iron deficiency anemia, unspecified: Secondary | ICD-10-CM | POA: Diagnosis not present

## 2019-12-10 DIAGNOSIS — Z818 Family history of other mental and behavioral disorders: Secondary | ICD-10-CM | POA: Diagnosis not present

## 2019-12-10 DIAGNOSIS — D120 Benign neoplasm of cecum: Secondary | ICD-10-CM | POA: Diagnosis not present

## 2019-12-10 DIAGNOSIS — Z885 Allergy status to narcotic agent status: Secondary | ICD-10-CM | POA: Diagnosis not present

## 2019-12-10 DIAGNOSIS — K639 Disease of intestine, unspecified: Secondary | ICD-10-CM | POA: Diagnosis not present

## 2019-12-10 DIAGNOSIS — Z803 Family history of malignant neoplasm of breast: Secondary | ICD-10-CM | POA: Diagnosis not present

## 2019-12-10 DIAGNOSIS — K219 Gastro-esophageal reflux disease without esophagitis: Secondary | ICD-10-CM | POA: Diagnosis not present

## 2019-12-10 LAB — BASIC METABOLIC PANEL
Anion gap: 12 (ref 5–15)
BUN: 25 mg/dL — ABNORMAL HIGH (ref 8–23)
CO2: 25 mmol/L (ref 22–32)
Calcium: 9.4 mg/dL (ref 8.9–10.3)
Chloride: 99 mmol/L (ref 98–111)
Creatinine, Ser: 1.01 mg/dL — ABNORMAL HIGH (ref 0.44–1.00)
GFR, Estimated: >60 mL/min (ref >60)
Glucose, Bld: 125 mg/dL — ABNORMAL HIGH (ref 70–99)
Potassium: 3.7 mmol/L (ref 3.5–5.1)
Sodium: 136 mmol/L (ref 135–145)

## 2019-12-10 LAB — SARS CORONAVIRUS 2 (TAT 6-24 HRS): SARS Coronavirus 2: NEGATIVE

## 2019-12-13 ENCOUNTER — Ambulatory Visit (HOSPITAL_COMMUNITY): Payer: Medicare Other | Admitting: Anesthesiology

## 2019-12-13 ENCOUNTER — Encounter (HOSPITAL_COMMUNITY)
Admission: RE | Disposition: A | Discharge status: Home / Self Care | Payer: Self-pay | Source: Home / Self Care | Attending: Internal Medicine

## 2019-12-13 ENCOUNTER — Encounter (HOSPITAL_COMMUNITY): Payer: Self-pay | Admitting: *Deleted

## 2019-12-13 ENCOUNTER — Other Ambulatory Visit: Payer: Self-pay

## 2019-12-13 ENCOUNTER — Ambulatory Visit (HOSPITAL_COMMUNITY)
Admission: RE | Admit: 2019-12-13 | Discharge: 2019-12-13 | Disposition: A | Discharge status: Home / Self Care | Payer: Medicare Other | Attending: Internal Medicine | Admitting: Internal Medicine

## 2019-12-13 DIAGNOSIS — Z823 Family history of stroke: Secondary | ICD-10-CM | POA: Insufficient documentation

## 2019-12-13 DIAGNOSIS — D509 Iron deficiency anemia, unspecified: Secondary | ICD-10-CM | POA: Diagnosis not present

## 2019-12-13 DIAGNOSIS — Z800 Family history of malignant neoplasm of digestive organs: Secondary | ICD-10-CM | POA: Diagnosis not present

## 2019-12-13 DIAGNOSIS — Z853 Personal history of malignant neoplasm of breast: Secondary | ICD-10-CM | POA: Insufficient documentation

## 2019-12-13 DIAGNOSIS — Z91048 Other nonmedicinal substance allergy status: Secondary | ICD-10-CM | POA: Insufficient documentation

## 2019-12-13 DIAGNOSIS — Z880 Allergy status to penicillin: Secondary | ICD-10-CM | POA: Insufficient documentation

## 2019-12-13 DIAGNOSIS — K648 Other hemorrhoids: Secondary | ICD-10-CM | POA: Insufficient documentation

## 2019-12-13 DIAGNOSIS — K219 Gastro-esophageal reflux disease without esophagitis: Secondary | ICD-10-CM | POA: Diagnosis not present

## 2019-12-13 DIAGNOSIS — D120 Benign neoplasm of cecum: Secondary | ICD-10-CM | POA: Diagnosis not present

## 2019-12-13 DIAGNOSIS — Z888 Allergy status to other drugs, medicaments and biological substances status: Secondary | ICD-10-CM | POA: Diagnosis not present

## 2019-12-13 DIAGNOSIS — Z8249 Family history of ischemic heart disease and other diseases of the circulatory system: Secondary | ICD-10-CM | POA: Insufficient documentation

## 2019-12-13 DIAGNOSIS — Z833 Family history of diabetes mellitus: Secondary | ICD-10-CM | POA: Insufficient documentation

## 2019-12-13 DIAGNOSIS — Z885 Allergy status to narcotic agent status: Secondary | ICD-10-CM | POA: Insufficient documentation

## 2019-12-13 DIAGNOSIS — Z86718 Personal history of other venous thrombosis and embolism: Secondary | ICD-10-CM | POA: Insufficient documentation

## 2019-12-13 DIAGNOSIS — Z881 Allergy status to other antibiotic agents status: Secondary | ICD-10-CM | POA: Insufficient documentation

## 2019-12-13 DIAGNOSIS — Z803 Family history of malignant neoplasm of breast: Secondary | ICD-10-CM | POA: Diagnosis not present

## 2019-12-13 DIAGNOSIS — Z8349 Family history of other endocrine, nutritional and metabolic diseases: Secondary | ICD-10-CM | POA: Diagnosis not present

## 2019-12-13 DIAGNOSIS — Z79899 Other long term (current) drug therapy: Secondary | ICD-10-CM | POA: Diagnosis not present

## 2019-12-13 DIAGNOSIS — K449 Diaphragmatic hernia without obstruction or gangrene: Secondary | ICD-10-CM | POA: Insufficient documentation

## 2019-12-13 DIAGNOSIS — K635 Polyp of colon: Secondary | ICD-10-CM | POA: Diagnosis not present

## 2019-12-13 DIAGNOSIS — N189 Chronic kidney disease, unspecified: Secondary | ICD-10-CM | POA: Insufficient documentation

## 2019-12-13 DIAGNOSIS — E1122 Type 2 diabetes mellitus with diabetic chronic kidney disease: Secondary | ICD-10-CM | POA: Insufficient documentation

## 2019-12-13 DIAGNOSIS — K317 Polyp of stomach and duodenum: Secondary | ICD-10-CM | POA: Diagnosis not present

## 2019-12-13 DIAGNOSIS — K639 Disease of intestine, unspecified: Secondary | ICD-10-CM | POA: Diagnosis not present

## 2019-12-13 DIAGNOSIS — K6289 Other specified diseases of anus and rectum: Secondary | ICD-10-CM | POA: Diagnosis not present

## 2019-12-13 DIAGNOSIS — Z818 Family history of other mental and behavioral disorders: Secondary | ICD-10-CM | POA: Insufficient documentation

## 2019-12-13 HISTORY — PX: ESOPHAGOGASTRODUODENOSCOPY (EGD) WITH PROPOFOL: SHX5813

## 2019-12-13 HISTORY — PX: BIOPSY: SHX5522

## 2019-12-13 HISTORY — PX: COLONOSCOPY WITH PROPOFOL: SHX5780

## 2019-12-13 HISTORY — PX: POLYPECTOMY: SHX5525

## 2019-12-13 LAB — GLUCOSE, CAPILLARY: Glucose-Capillary: 113 mg/dL — ABNORMAL HIGH (ref 70–99)

## 2019-12-13 SURGERY — COLONOSCOPY WITH PROPOFOL
Anesthesia: General

## 2019-12-13 MED ORDER — LACTATED RINGERS IV SOLN: INTRAVENOUS | Status: DC | PRN

## 2019-12-13 MED ORDER — PROPOFOL 10 MG/ML IV BOLUS: INTRAVENOUS | Status: DC | PRN

## 2019-12-13 MED ORDER — LIDOCAINE VISCOUS HCL 2 % MT SOLN
OROMUCOSAL | Status: AC
  Filled 2019-12-13: qty 15

## 2019-12-13 MED ORDER — STERILE WATER FOR IRRIGATION IR SOLN
Status: DC | PRN
  Administered 2019-12-13: 1.5 mL

## 2019-12-13 MED ORDER — LACTATED RINGERS IV SOLN
INTRAVENOUS | Status: DC
  Administered 2019-12-13: 1000 mL via INTRAVENOUS

## 2019-12-13 MED ORDER — LIDOCAINE VISCOUS HCL 2 % MT SOLN
15.0000 mL | Freq: Once | OROMUCOSAL | Status: AC
  Administered 2019-12-13: 15 mL via OROMUCOSAL

## 2019-12-13 MED ORDER — CHLORHEXIDINE GLUCONATE CLOTH 2 % EX PADS: 6.0000 | MEDICATED_PAD | Freq: Once | CUTANEOUS | Status: DC

## 2019-12-13 MED ORDER — PROPOFOL 10 MG/ML IV BOLUS
INTRAVENOUS | Status: DC | PRN
  Administered 2019-12-13: 50 mg via INTRAVENOUS

## 2019-12-13 MED ORDER — GLYCOPYRROLATE 0.2 MG/ML IJ SOLN
INTRAMUSCULAR | Status: AC
  Filled 2019-12-13: qty 1

## 2019-12-13 MED ORDER — PROPOFOL 500 MG/50ML IV EMUL
INTRAVENOUS | Status: DC | PRN
  Administered 2019-12-13: 150 ug/kg/min via INTRAVENOUS

## 2019-12-13 MED ORDER — GLYCOPYRROLATE 0.2 MG/ML IJ SOLN
0.2000 mg | Freq: Once | INTRAMUSCULAR | Status: AC
  Administered 2019-12-13: 0.2 mg via INTRAVENOUS

## 2019-12-13 NOTE — Op Note (Signed)
[facility] Patient Name: Wendy Gilbert Procedure Date: 12/13/2019 10:22 AM MRN: [id number] Date of Birth: 09/18/1960 Attending MD: Elon Alas. Abbey Chatters DO CSN: [id number] Age: 59 Admit Type: Outpatient Procedure:                Colonoscopy Indications:              Iron deficiency anemia Providers:                Elon Alas. Abbey Chatters, DO, Tacy Learn,                            Technician Referring MD:              Medicines:                See the Anesthesia note for documentation of the                            administered medications Complications:            No immediate complications. Estimated Blood Loss:     Estimated blood loss was minimal. Procedure:                Pre-Anesthesia Assessment:                           - The anesthesia plan was to use monitored                            anesthesia care (MAC).                           After obtaining informed consent, the colonoscope                            was passed under direct vision. Throughout the                            procedure, the patient's blood pressure, pulse, and                            oxygen saturations were monitored continuously. The                            [id number]-HQ190L ([id number]) scope was introduced through                            the anus and advanced to the the cecum, identified                            by appendiceal orifice and ileocecal valve. The                            colonoscopy was performed without difficulty. The                            patient tolerated the procedure well. The quality  of the bowel preparation was evaluated using the                            BBPS [facility] Bowel Preparation Scale) with scores                            of: Right Colon = 2 (minor amount of residual                            staining, small fragments of stool and/or opaque                            liquid, but mucosa seen well), Transverse Colon = 3                             (entire mucosa seen well with no residual staining,                            small fragments of stool or opaque liquid) and Left                            Colon = 3 (entire mucosa seen well with no residual                            staining, small fragments of stool or opaque                            liquid). The total BBPS score equals 8. The quality                            of the bowel preparation was good. Scope In: 10:24:52 AM Scope Out: 10:39:13 AM Scope Withdrawal Time: 0 hours 10 minutes 55 seconds  Total Procedure Duration: 0 hours 14 minutes 21 seconds  Findings:      The perianal and digital rectal examinations were normal.      Non-bleeding internal hemorrhoids were found during endoscopy.      A 2 mm polyp was found in the cecum. The polyp was sessile. The polyp       was removed with a jumbo cold forceps. Resection and retrieval were       complete.      Localized moderate inflammation characterized by erosions and erythema       was found in the rectum. Biopsies were taken with a cold forceps for       histology.      The terminal ileum appeared normal. Impression:               - Non-bleeding internal hemorrhoids.                           - One 2 mm polyp in the cecum, removed with a jumbo                            cold forceps. Resected and retrieved.                           -  Localized moderate inflammation was found in the                            rectum secondary to proctitis. Biopsied.                           - The examined portion of the ileum was normal. Moderate Sedation:      Per Anesthesia Care Recommendation:           - Patient has a contact number available for                            emergencies. The signs and symptoms of potential                            delayed complications were discussed with the                            patient. Return to normal activities tomorrow.                            Written  discharge instructions were provided to the                            patient.                           - Resume previous diet.                           - Continue present medications.                           - Await pathology results.                           - Repeat colonoscopy date to be determined after                            pending pathology results are reviewed for                            surveillance based on pathology results.                           - Return to GI clinic in 3 weeks with Dr. Abbey Chatters. Procedure Code(s):        --- Professional ---                           [phone], Colonoscopy, flexible; with biopsy, single                            or multiple Diagnosis Code(s):        --- Professional ---  K63.5, Polyp of colon                           K62.89, Other specified diseases of anus and rectum                           K64.8, Other hemorrhoids                           D50.9, Iron deficiency anemia, unspecified CPT copyright 2019 American Medical Association. All rights reserved. The codes documented in this report are preliminary and upon coder review may  be revised to meet current compliance requirements. Elon Alas. Abbey Chatters, DO [place] Abbey Chatters, DO 12/13/2019 10:44:11 AM This report has been signed electronically. Number of Addenda: 0

## 2019-12-13 NOTE — Interval H&P Note (Signed)
History and Physical Interval Note:  12/13/2019 9:15 AM  Wendy Gilbert  has presented today for surgery, with the diagnosis of ida, gerd.  The various methods of treatment have been discussed with the patient and family. After consideration of risks, benefits and other options for treatment, the patient has consented to  Procedure(s) with comments: COLONOSCOPY WITH PROPOFOL (N/A) - 12:30pm- patient will arrive as soon as she can. ESOPHAGOGASTRODUODENOSCOPY (EGD) WITH PROPOFOL (N/A) as a surgical intervention.  The patient's history has been reviewed, patient examined, no change in status, stable for surgery.  I have reviewed the patient's chart and labs.  Questions were answered to the patient's satisfaction.     [name]

## 2019-12-13 NOTE — Discharge Instructions (Addendum)
EGD Discharge instructions Please read the instructions outlined below and refer to this sheet in the next few weeks. These discharge instructions provide you with general information on caring for yourself after you leave the hospital. Your doctor may also give you specific instructions. While your treatment has been planned according to the most current medical practices available, unavoidable complications occasionally occur. If you have any problems or questions after discharge, please call your doctor. ACTIVITY  You may resume your regular activity but move at a slower pace for the next 24 hours.   Take frequent rest periods for the next 24 hours.   Walking will help expel (get rid of) the air and reduce the bloated feeling in your abdomen.   No driving for 24 hours (because of the anesthesia (medicine) used during the test).   You may shower.   Do not sign any important legal documents or operate any machinery for 24 hours (because of the anesthesia used during the test).  NUTRITION  Drink plenty of fluids.   You may resume your normal diet.   Begin with a light meal and progress to your normal diet.   Avoid alcoholic beverages for 24 hours or as instructed by your caregiver.  MEDICATIONS  You may resume your normal medications unless your caregiver tells you otherwise.  WHAT YOU CAN EXPECT TODAY  You may experience abdominal discomfort such as a feeling of fullness or "gas" pains.  FOLLOW-UP  Your doctor will discuss the results of your test with you.  SEEK IMMEDIATE MEDICAL ATTENTION IF ANY OF THE FOLLOWING OCCUR:  Excessive nausea (feeling sick to your stomach) and/or vomiting.   Severe abdominal pain and distention (swelling).   Trouble swallowing.   Temperature over 101 F (37.8 C).   Rectal bleeding or vomiting of blood.     Colonoscopy Discharge Instructions  Read the instructions outlined below and refer to this sheet in the next few weeks. These  discharge instructions provide you with general information on caring for yourself after you leave the hospital. Your doctor may also give you specific instructions. While your treatment has been planned according to the most current medical practices available, unavoidable complications occasionally occur.   ACTIVITY  You may resume your regular activity, but move at a slower pace for the next 24 hours.   Take frequent rest periods for the next 24 hours.   Walking will help get rid of the air and reduce the bloated feeling in your belly (abdomen).   No driving for 24 hours (because of the medicine (anesthesia) used during the test).    Do not sign any important legal documents or operate any machinery for 24 hours (because of the anesthesia used during the test).  NUTRITION  Drink plenty of fluids.   You may resume your normal diet as instructed by your doctor.   Begin with a light meal and progress to your normal diet. Heavy or fried foods are harder to digest and may make you feel sick to your stomach (nauseated).   Avoid alcoholic beverages for 24 hours or as instructed.  MEDICATIONS  You may resume your normal medications unless your doctor tells you otherwise.  WHAT YOU CAN EXPECT TODAY  Some feelings of bloating in the abdomen.   Passage of more gas than usual.   Spotting of blood in your stool or on the toilet paper.  IF YOU HAD POLYPS REMOVED DURING THE COLONOSCOPY:  No aspirin products for 7 days or as instructed.  No alcohol for 7 days or as instructed.   Eat a soft diet for the next 24 hours.  FINDING OUT THE RESULTS OF YOUR TEST Not all test results are available during your visit. If your test results are not back during the visit, make an appointment with your caregiver to find out the results. Do not assume everything is normal if you have not heard from your caregiver or the medical facility. It is important for you to follow up on all of your test results.   SEEK IMMEDIATE MEDICAL ATTENTION IF:  You have more than a spotting of blood in your stool.   Your belly is swollen (abdominal distention).   You are nauseated or vomiting.   You have a temperature over 101.   You have abdominal pain or discomfort that is severe or gets worse throughout the day.   Your EGD showed numerous polyps in your stomach.  These are likely benign fundic gland polyps.  I did take many biopsies and we should have these results back by the end of the week.  Further recommendations to follow depending on pathology results.  Your colonoscopy revealed one small polyp which I removed successfully.  You do have inflammation in your rectum of unknown etiology.  I took biopsies of this and we should have these results back by the end of the week as well.  Inflammation can be seen with infections, NSAID use, underlying inflammatory bowel disease such as ulcerative colitis.  Follow-up with Dr. Abbey Chatters in 3 weeks to go over results and we will decide what we need to do next.  I hope you have a great rest of your week!  Elon Alas. Abbey Chatters, D.O. Gastroenterology and Hepatology [facility]

## 2019-12-13 NOTE — Anesthesia Preprocedure Evaluation (Addendum)
Anesthesia Evaluation  Patient identified by MRN, date of birth, ID band Patient awake    Reviewed: Allergy & Precautions, H&P , NPO status , Patient's Chart, lab work & pertinent test results, reviewed documented beta blocker date and time   History of Anesthesia Complications (+) PONV and history of anesthetic complications  Airway Mallampati: II  TM Distance: >3 FB Neck ROM: full    Dental no notable dental hx. (+) Poor Dentition, Loose, Dental Advisory Given, Chipped,    Pulmonary neg pulmonary ROS,    Pulmonary exam normal breath sounds clear to auscultation       Cardiovascular Exercise Tolerance: Good negative cardio ROS   Rhythm:regular Rate:Normal     Neuro/Psych  Headaches, negative psych ROS   GI/Hepatic Neg liver ROS, GERD  Medicated,  Endo/Other  negative endocrine ROSdiabetes  Renal/GU negative Renal ROS  negative genitourinary   Musculoskeletal   Abdominal   Peds  Hematology negative hematology ROS (+)   Anesthesia Other Findings   Reproductive/Obstetrics negative OB ROS                            Anesthesia Physical Anesthesia Plan  ASA: II  Anesthesia Plan: General   Post-op Pain Management:    Induction:   PONV Risk Score and Plan: Propofol infusion  Airway Management Planned:   Additional Equipment:   Intra-op Plan:   Post-operative Plan:   Informed Consent: I have reviewed the patients History and Physical, chart, labs and discussed the procedure including the risks, benefits and alternatives for the proposed anesthesia with the patient or authorized representative who has indicated his/her understanding and acceptance.     Dental Advisory Given  Plan Discussed with: CRNA  Anesthesia Plan Comments:         Anesthesia Quick Evaluation

## 2019-12-13 NOTE — Anesthesia Postprocedure Evaluation (Signed)
Anesthesia Post Note  Patient: Wendy Gilbert  Procedure(s) Performed: COLONOSCOPY WITH PROPOFOL (N/A ) ESOPHAGOGASTRODUODENOSCOPY (EGD) WITH PROPOFOL (N/A ) BIOPSY POLYPECTOMY  Patient location during evaluation: PACU Anesthesia Type: General Level of consciousness: awake, oriented, awake and alert and patient cooperative Pain management: satisfactory to patient Vital Signs Assessment: post-procedure vital signs reviewed and stable Respiratory status: spontaneous breathing, respiratory function stable and nonlabored ventilation Cardiovascular status: stable Postop Assessment: no apparent nausea or vomiting Anesthetic complications: no   No complications documented.   Last Vitals:  Vitals:   12/13/19 0901 12/13/19 1046  BP: 131/87   Pulse: 93 98  Resp: 16   Temp: 36.4 C 36.4 C  SpO2: 100%     Last Pain:  Vitals:   12/13/19 1046  TempSrc: Oral  PainSc:                  Willa Rough

## 2019-12-13 NOTE — Op Note (Signed)
[facility] Patient Name: Wendy Gilbert Procedure Date: 12/13/2019 10:03 AM MRN: [id number] Date of Birth: 1960-03-24 Attending MD: Elon Alas. Abbey Chatters DO CSN: [id number] Age: 59 Admit Type: Outpatient Procedure:                Upper GI endoscopy Indications:              Iron deficiency anemia Providers:                Elon Alas. Abbey Chatters, DO, Tacy Learn,                            Technician Referring MD:              Medicines:                See the Anesthesia note for documentation of the                            administered medications Complications:            No immediate complications. Estimated Blood Loss:     Estimated blood loss was minimal. Procedure:                Pre-Anesthesia Assessment:                           - The anesthesia plan was to use monitored                            anesthesia care (MAC).                           After obtaining informed consent, the endoscope was                            passed under direct vision. Throughout the                            procedure, the patient's blood pressure, pulse, and                            oxygen saturations were monitored continuously. The                            GIF-H190 ([id number]) scope was introduced through the                            mouth, and advanced to the second part of duodenum.                            The upper GI endoscopy was accomplished without                            difficulty. The patient tolerated the procedure                            well. Scope In: 10:13:46 AM Scope  Out: 10:18:31 AM Total Procedure Duration: 0 hours 4 minutes 45 seconds  Findings:      A medium-sized hiatal hernia was present.      Numerous large pedunculated and sessile polyps with no bleeding and no       stigmata of recent bleeding were found in the gastric fundus and in the       gastric body. Biopsies were taken with a cold forceps for histology.      The duodenal bulb, first  portion of the duodenum and second portion of       the duodenum were normal. Impression:               - Medium-sized hiatal hernia.                           - Numerous gastric polyps. Biopsied.                           - Normal duodenal bulb, first portion of the                            duodenum and second portion of the duodenum. Moderate Sedation:      Per Anesthesia Care Recommendation:           - Patient has a contact number available for                            emergencies. The signs and symptoms of potential                            delayed complications were discussed with the                            patient. Return to normal activities tomorrow.                            Written discharge instructions were provided to the                            patient.                           - Resume previous diet.                           - Continue present medications.                           - Await pathology results.                           - - Further recommendations to follow depending on                            pathology. Procedure Code(s):        --- Professional ---  41660, Esophagogastroduodenoscopy, flexible,                            transoral; with biopsy, single or multiple Diagnosis Code(s):        --- Professional ---                           K44.9, Diaphragmatic hernia without obstruction or                            gangrene                           K31.7, Polyp of stomach and duodenum                           D50.9, Iron deficiency anemia, unspecified CPT copyright 2019 American Medical Association. All rights reserved. The codes documented in this report are preliminary and upon coder review may  be revised to meet current compliance requirements. Elon Alas. Abbey Chatters, DO [place] Abbey Chatters, DO 12/13/2019 10:21:51 AM This report has been signed electronically. Number of Addenda: 0

## 2019-12-13 NOTE — Transfer of Care (Signed)
Immediate Anesthesia Transfer of Care Note  Patient: Mialynn Shelvin Mccammon  Procedure(s) Performed: COLONOSCOPY WITH PROPOFOL (N/A ) ESOPHAGOGASTRODUODENOSCOPY (EGD) WITH PROPOFOL (N/A ) BIOPSY POLYPECTOMY  Patient Location: PACU  Anesthesia Type:General  Level of Consciousness: awake, alert , oriented and patient cooperative  Airway & Oxygen Therapy: Patient Spontanous Breathing  Post-op Assessment: Report given to RN, Post -op Vital signs reviewed and stable and Patient moving all extremities X 4  Post vital signs: Reviewed and stable  Last Vitals:  Vitals Value Taken Time  BP    Temp    Pulse    Resp    SpO2      Last Pain:  Vitals:   12/13/19 1009  TempSrc:   PainSc: 0-No pain         Complications: No complications documented.

## 2019-12-14 LAB — SURGICAL PATHOLOGY

## 2019-12-17 ENCOUNTER — Encounter (HOSPITAL_COMMUNITY): Payer: Self-pay | Admitting: Internal Medicine

## 2019-12-22 NOTE — Progress Notes (Signed)
On recall  °

## 2020-01-03 ENCOUNTER — Other Ambulatory Visit: Payer: Self-pay | Admitting: Family Medicine

## 2020-01-03 DIAGNOSIS — K219 Gastro-esophageal reflux disease without esophagitis: Secondary | ICD-10-CM

## 2020-01-03 DIAGNOSIS — I10 Essential (primary) hypertension: Secondary | ICD-10-CM

## 2020-01-13 ENCOUNTER — Other Ambulatory Visit: Payer: Self-pay

## 2020-01-13 ENCOUNTER — Ambulatory Visit (INDEPENDENT_AMBULATORY_CARE_PROVIDER_SITE_OTHER): Payer: Medicare Other | Admitting: Internal Medicine

## 2020-01-13 ENCOUNTER — Encounter: Payer: Self-pay | Admitting: Internal Medicine

## 2020-01-13 VITALS — BP 119/82 | HR 88 | Temp 97.5°F | Ht 64.0 in | Wt 190.2 lb

## 2020-01-13 DIAGNOSIS — K317 Polyp of stomach and duodenum: Secondary | ICD-10-CM | POA: Insufficient documentation

## 2020-01-13 DIAGNOSIS — K219 Gastro-esophageal reflux disease without esophagitis: Secondary | ICD-10-CM

## 2020-01-13 NOTE — Progress Notes (Signed)
Referring Provider: Janora Norlander, DO Primary Care Physician:  Janora Norlander, DO Primary GI:  Dr. Abbey Chatters  Chief Complaint  Patient presents with  . Gastroesophageal Reflux    occ at night    HPI:   Wendy Gilbert is a 59 y.o. female who presents to the clinic today for procedure follow-up visit.  She recently underwent EGD and colonoscopy due to history of iron deficiency anemia.  EGD showed gastritis, H. pylori negative.  She had numerous gastric polyps many of which were greater than 1 cm.  Sampling of these reveal fundic gland polyps.  Patient has been on PPIs for over 15 years.  Colonoscopy relatively unremarkable besides one small polyp, tubular adenoma, with 7-year recall.  Today patient states she is doing relatively okay.  Chronic GERD is controlled for the most part.  Does have some nighttime breakthrough symptoms on occasion which she uses Tums for  Past Medical History:  Diagnosis Date  . Arthritis    "all over"  . Diabetes mellitus without complication ([place])   . DVT (deep venous thrombosis) ([place]) 10/2008   left arm  . GERD (gastroesophageal reflux disease)   . Headache(784.0)    sinus  . 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 PONV (postoperative nausea and vomiting)   . Seasonal allergies   . Sinusitis   . Vitamin D deficiency 04/13/2014    Past Surgical History:  Procedure Laterality Date  . BIOPSY  12/13/2019   Procedure: BIOPSY;  Surgeon: [name], DO;  [facility];  Service: Endoscopy;;  gastric polyps  . CESAREAN SECTION  09/1999  . COLONOSCOPY WITH PROPOFOL N/A 12/13/2019   Procedure: COLONOSCOPY WITH PROPOFOL;  Surgeon: [name], DO;  [facility]: [facility];  Service: Endoscopy;  Laterality: N/A;  12:30pm- patient will arrive as soon as she can.  Marland Kitchen DILATION AND CURETTAGE OF UTERUS  1978  . ESOPHAGOGASTRODUODENOSCOPY (EGD) WITH PROPOFOL N/A 12/13/2019   Procedure: ESOPHAGOGASTRODUODENOSCOPY (EGD) WITH  PROPOFOL;  Surgeon: [name], DO;  [facility];  Service: Endoscopy;  Laterality: N/A;  . INCISION AND DRAINAGE ABSCESS  10/25/2008   and debridement left axillary abscess, abd. wall abscess, left thigh abscess  . KNEE ARTHROSCOPY Left 11/1996  . MASTECTOMY Bilateral 02/23/2009   right modified radical, left simple  . MASTECTOMY  2011    bilateral   . POLYPECTOMY  12/13/2019   Procedure: POLYPECTOMY;  Surgeon: [name], DO;  [facility];  Service: Endoscopy;;  . PORT-A-CATH REMOVAL Left 05/07/2012   Procedure: REMOVAL PORT-A-CATH;  Surgeon: Odis Hollingshead, MD;  Location: [facility];  Service: General;  Laterality: Left;  . PORTACATH PLACEMENT  08/09/2008  . TOTAL KNEE ARTHROPLASTY Left 08/10/2013   Procedure: LEFT TOTAL KNEE ARTHROPLASTY;  Surgeon: Tobi Bastos, MD;  Location: [facility];  Service: Orthopedics;  Laterality: Left;  . TUBAL LIGATION      Current Outpatient Medications  Medication Sig Dispense Refill  . acetaminophen (TYLENOL) 650 MG CR tablet Take 1,300 mg by mouth at bedtime.    . fluticasone (FLONASE) 50 MCG/ACT nasal spray Place 1-2 sprays into both nostrils daily as needed (allergies.). As needed    . Magnesium 500 MG CAPS Take 1,000 mg by mouth at bedtime.     . pantoprazole (PROTONIX) 40 MG tablet Take 1 tablet (40 mg total) by mouth daily. (Needs to be seen before next refill) 30 tablet 0  .  triamterene-hydrochlorothiazide (MAXZIDE) 75-50 MG tablet Take 1 tablet by mouth daily. (Needs to be seen before next refill) 30 tablet 0  . ferrous sulfate 325 (65 FE) MG tablet Take 325 mg by mouth in the morning and at bedtime.  (Patient not taking: Reported on 01/13/2020)     No current facility-administered medications for this visit.    Allergies as of 01/13/2020 - Review Complete 01/13/2020  Allergen Reaction Noted  . Demerol Other (See Comments) 07/31/2010  . Erythromycin Nausea And Vomiting 07/31/2010  . Adhesive  [tape] Rash 05/01/2012  . Cephalexin Hives and Other (See Comments) 07/31/2010  . Penicillins Other (See Comments) 07/31/2010    Family History  Problem Relation Age of Onset  . Breast cancer Mother   . Cancer Mother        breast, colon, ovarian  . Heart disease Father   . Cancer Paternal Uncle        pancreatic  . Deep vein thrombosis Brother   . Aortic aneurysm Brother   . ADD / ADHD Daughter   . Asthma Daughter   . Diabetes Maternal Grandmother   . Stroke Maternal Grandmother   . Hypothyroidism Maternal Grandfather   . Heart disease Maternal Grandfather   . Anuerysm Paternal Grandmother   . Heart attack Paternal Grandfather     Social History   Socioeconomic History  . Marital status: Married    Spouse name: Ayesha Rumpf  . Number of children: 2  . Years of education: 31  . Highest education level: Some college, no degree  Occupational History  . Occupation: disabled  Tobacco Use  . Smoking status: Never Smoker  . Smokeless tobacco: Never Used  Vaping Use  . Vaping Use: Never used  Substance and Sexual Activity  . Alcohol use: No  . Drug use: No  . Sexual activity: Not Currently  Other Topics Concern  . Not on file  Social History Narrative  . Not on file   Social Determinants of Health   Financial Resource Strain:   . Difficulty of Paying Living Expenses: Not on file  Food Insecurity:   . Worried About Charity fundraiser in the Last Year: Not on file  . Ran Out of Food in the Last Year: Not on file  Transportation Needs:   . Lack of Transportation (Medical): Not on file  . Lack of Transportation (Non-Medical): Not on file  Physical Activity:   . Days of Exercise per Week: Not on file  . Minutes of Exercise per Session: Not on file  Stress:   . Feeling of Stress : Not on file  Social Connections:   . Frequency of Communication with Friends and Family: Not on file  . Frequency of Social Gatherings with Friends and Family: Not on file  . Attends  Religious Services: Not on file  . Active Member of Clubs or Organizations: Not on file  . Attends Archivist Meetings: Not on file  . Marital Status: Not on file    Subjective: Review of Systems  Constitutional: Negative for chills and fever.  HENT: Negative for congestion and hearing loss.   Eyes: Negative for blurred vision and double vision.  Respiratory: Negative for cough and shortness of breath.   Cardiovascular: Negative for chest pain and palpitations.  Gastrointestinal: Positive for heartburn. Negative for abdominal pain, blood in stool, constipation, diarrhea, melena and vomiting.  Genitourinary: Negative for dysuria and urgency.  Musculoskeletal: Negative for joint pain and myalgias.  Skin: Negative for itching  and rash.  Neurological: Negative for dizziness and headaches.  Psychiatric/Behavioral: Negative for depression. The patient is not nervous/anxious.      Objective: BP 119/82   Pulse 88   Temp (!) 97.5 F (36.4 C) (Temporal)   Ht 5\' 4"  (1.626 m)   Wt 190 lb 3.2 oz (86.3 kg)   BMI 32.65 kg/m  Physical Exam Constitutional:      Appearance: Normal appearance. She is obese.  HENT:     Head: Normocephalic and atraumatic.  Eyes:     Extraocular Movements: Extraocular movements intact.     Conjunctiva/sclera: Conjunctivae normal.  Cardiovascular:     Rate and Rhythm: Normal rate and regular rhythm.  Pulmonary:     Effort: Pulmonary effort is normal.     Breath sounds: Normal breath sounds.  Abdominal:     General: Bowel sounds are normal.     Palpations: Abdomen is soft.  Musculoskeletal:        General: No swelling. Normal range of motion.     Cervical back: Normal range of motion and neck supple.  Skin:    General: Skin is warm and dry.     Coloration: Skin is not jaundiced.  Neurological:     General: No focal deficit present.     Mental Status: She is alert and oriented to person, place, and time.  Psychiatric:        Mood and Affect:  Mood normal.        Behavior: Behavior normal.      Assessment: *Chronic GERD-relatively well controlled on Protonix and as needed Tums *Multiple gastric polyps *Iron deficiency anemia *Adenomatous colon polyp  Plan: Regards to patient's chronic GERD, will continue on Protonix daily.  She can take Tums for breakthrough symptoms as needed.    She has numerous gastric polyps many of which are greater than 1 cm.  Biopsies of these came back as fundic gland polyps.  Given her anemia and size of the polyps, we will schedule her for repeat EGD with multiple polypectomies.  I will ask for extended block time for this particular procedure.  Colon recall 2028.  Further recommendations to follow.  01/13/2020 3:20 PM   Disclaimer: This note was dictated with voice recognition software. Similar sounding words can inadvertently be transcribed and may not be corrected upon review.

## 2020-01-13 NOTE — Patient Instructions (Signed)
We will schedule you for EGD to be performed in January to remove all of your gastric polyps.  Continue on Protonix daily.  Can take Tums for breakthrough symptoms.  Further recommendations to follow.  I hope you have a great Christmas (at least better than your Halloween).  At [facility] we value your feedback. You may receive a survey about your visit today. Please share your experience as we strive to create trusting relationships with our patients to provide genuine, compassionate, quality care.  We appreciate your understanding and patience as we review any laboratory studies, imaging, and other diagnostic tests that are ordered as we care for you. Our office policy is 5 business days for review of these results, and any emergent or urgent results are addressed in a timely manner for your best interest. If you do not hear from our office in 1 week, please contact us.   We also encourage the use of MyChart, which contains your medical information for your review as well. If you are not enrolled in this feature, an access code is on this after visit summary for your convenience. Thank you for allowing [place] to be involved in your care.  It was great to see you today!  I hope you have a great rest of your winter!!    Elon Alas. Abbey Chatters, D.O. Gastroenterology and Hepatology [facility]

## 2020-01-14 ENCOUNTER — Telehealth: Payer: Self-pay | Admitting: *Deleted

## 2020-01-14 NOTE — Telephone Encounter (Signed)
LMOVM for pt to call back to schedule EGD , asa 2 in Jan with Dr. Abbey Chatters, double time

## 2020-01-17 NOTE — Telephone Encounter (Signed)
LMOVM

## 2020-01-17 NOTE — Telephone Encounter (Signed)
Letter mailed

## 2020-01-27 ENCOUNTER — Telehealth: Payer: Self-pay

## 2020-01-27 DIAGNOSIS — K219 Gastro-esophageal reflux disease without esophagitis: Secondary | ICD-10-CM

## 2020-01-27 DIAGNOSIS — I10 Essential (primary) hypertension: Secondary | ICD-10-CM

## 2020-01-27 MED ORDER — PANTOPRAZOLE SODIUM 40 MG PO TBEC: 40.0000 mg | DELAYED_RELEASE_TABLET | Freq: Every day | ORAL | 0 refills | Status: DC

## 2020-01-27 MED ORDER — TRIAMTERENE-HCTZ 75-50 MG PO TABS: 1.0000 | ORAL_TABLET | Freq: Every day | ORAL | 0 refills | Status: DC

## 2020-01-27 NOTE — Telephone Encounter (Signed)
  Prescription Request  01/27/2020  What is the name of the medication or equipment?  pantoprazole (PROTONIX) 40 MG tablet triamterene-hydrochlorothiazide (MAXZIDE) 75-50 MG tablet  Have you contacted your pharmacy to request a refill? (if applicable) no--pt has apt with Lajuana Ripple 02/17/2019 for med refill  Which pharmacy would you like this sent to? [facility]   Patient notified that their request is being sent to the clinical staff for review and that they should receive a response within 2 business days.

## 2020-01-27 NOTE — Telephone Encounter (Signed)
Rx's sent- left patient a detailed message.

## 2020-02-03 ENCOUNTER — Other Ambulatory Visit: Payer: Self-pay

## 2020-02-03 ENCOUNTER — Encounter: Payer: Self-pay | Admitting: Family

## 2020-02-03 ENCOUNTER — Ambulatory Visit (INDEPENDENT_AMBULATORY_CARE_PROVIDER_SITE_OTHER): Payer: Medicare Other

## 2020-02-03 ENCOUNTER — Ambulatory Visit (INDEPENDENT_AMBULATORY_CARE_PROVIDER_SITE_OTHER): Payer: Medicare Other | Admitting: Family

## 2020-02-03 VITALS — BP 143/98 | HR 103 | Temp 96.8°F | Resp 20 | Ht 64.0 in | Wt 190.0 lb

## 2020-02-03 DIAGNOSIS — W19XXXA Unspecified fall, initial encounter: Secondary | ICD-10-CM | POA: Diagnosis not present

## 2020-02-03 DIAGNOSIS — S025XXA Fracture of tooth (traumatic), initial encounter for closed fracture: Secondary | ICD-10-CM | POA: Diagnosis not present

## 2020-02-03 DIAGNOSIS — S022XXA Fracture of nasal bones, initial encounter for closed fracture: Secondary | ICD-10-CM | POA: Diagnosis not present

## 2020-02-03 DIAGNOSIS — S0031XA Abrasion of nose, initial encounter: Secondary | ICD-10-CM

## 2020-02-03 NOTE — Progress Notes (Signed)
Subjective:    Patient ID: Wendy Gilbert, female    DOB: [birth year], 59 y.o.   MRN: [id number]  Chief Complaint  Patient presents with  . Fall    Patient fell in parking lot on way back to car    PT presents to the office today after leaving our office with her daughter and tripping on the sidewalk. She fell face forward. She landed on her nose. She reports she knocked out one of her front teeth.  Fall The accident occurred less than 1 hour ago. She fell from a height of 1 to 2 ft. She landed on concrete. The volume of blood lost was minimal. Point of impact: face. The pain is present in the nose. The pain is at a severity of 6/10. The pain is moderate. Associated symptoms include numbness (front teeth) and tingling. Pertinent negatives include no hearing loss or hematuria. She has tried nothing for the symptoms. The treatment provided no relief.      Review of Systems  Genitourinary: Negative for hematuria.  Neurological: Positive for tingling and numbness (front teeth).  All other systems reviewed and are negative.      Objective:   Physical Exam Vitals reviewed.  Constitutional:      General: She is not in acute distress.    Appearance: She is well-developed and well-nourished.  HENT:     Head: Normocephalic and atraumatic.     Nose: Nasal deformity, signs of injury, nasal tenderness and mucosal edema present.     Right Turbinates: Swollen.     Left Turbinates: Swollen.     Mouth/Throat:     Mouth: Oropharynx is clear and moist.     Dentition: Abnormal dentition. Dental tenderness and dental caries present.      Comments: Teeth broken off Eyes:     Pupils: Pupils are equal, round, and reactive to light.  Neck:     Thyroid: No thyromegaly.  Cardiovascular:     Rate and Rhythm: Normal rate and regular rhythm.     Pulses: Intact distal pulses.     Heart sounds: Normal heart sounds. No murmur heard.   Pulmonary:     Effort: Pulmonary effort is normal. No respiratory  distress.     Breath sounds: Normal breath sounds. No wheezing.  Abdominal:     General: Bowel sounds are normal. There is no distension.     Palpations: Abdomen is soft.     Tenderness: There is no abdominal tenderness.  Musculoskeletal:        General: No tenderness or edema. Normal range of motion.     Cervical back: Normal range of motion and neck supple.  Skin:    General: Skin is warm and dry.          Comments: Small abrasion on bilateral hands and nose  Neurological:     Mental Status: She is alert and oriented to person, place, and time.     Cranial Nerves: No cranial nerve deficit.     Deep Tendon Reflexes: Reflexes are normal and symmetric.  Psychiatric:        Mood and Affect: Mood and affect normal.        Behavior: Behavior normal.        Thought Content: Thought content normal.        Judgment: Judgment normal.    Nasal x-ray- Displaced fracture bilaterally, Preliminary reading by Evelina Dun, FNP [facility]   BP (!) 143/98   Pulse (!) 103  Temp (!) 96.8 F (36 C) (Temporal)   Resp 20   Ht 5\' 4"  (1.626 m)   Wt 190 lb (86.2 kg)   SpO2 98%   BMI 32.61 kg/m      Assessment & Plan:  CELA NEWCOM comes in today with chief complaint of Fall (Patient fell in parking lot on way back to car/)   Diagnosis and orders addressed:  1. Nasal abrasion, initial encounter - DG Nasal Bones; Future  2. Fall on concrete  3. Closed fracture of nasal bone, initial encounter - Ambulatory referral to ENT  4. Closed fracture of tooth, initial encounter   Urgent referral placed to ENT Needs to call dentist for appt  Avoid blowing nose or trauma Fall precaution Red flags discussed to go to ED- No SOB at this time  Evelina Dun, FNP

## 2020-02-03 NOTE — Patient Instructions (Signed)
Nasal Fracture A nasal fracture is a break or crack in the bones of the nose or the tissue that helps to form the nose (cartilage). Minor breaks do not require treatment and usually heal on their own after about one month. Serious breaks may require treatment that could include surgery. What are the causes? A nasal fracture is usually caused by the strong force of a direct hit to the nose (blunt injury). This type of injury often occurs from:  Playing a contact sport.  Being involved in a car accident.  Falling.  Getting punched. What are the signs or symptoms? Symptoms of this condition include:  Pain.  Swelling of the nose.  Bleeding from the nose.  Bruising around the nose or bruising around the eyes (black eyes).  Crooked appearance of the nose. How is this diagnosed? This condition may be diagnosed based on a physical exam. During the exam, the health care provider will:  Gently feel the nose for signs of broken bones.  Look inside the nostrils to check if there is a blood-filled swelling on the dividing wall between the nostrils (septal hematoma). An X-ray of the nose may be taken. Sometimes, an X-ray may not show a nasal fracture even when one is present. In some cases, X-rays or a CT scan may be taken again 1-5 days later after the swelling has gone down. How is this treated? Treatment for this condition depends on the severity of the injury.  Minor fractures that have not caused deformity often do not require treatment.  For more serious fractures that have caused bones to move out of position, treatment may involve one of the following: ? Repositioning the bones without surgery. The health care provider may be able to do this in his or her office after you are given medicine to numb the nasal area (local anesthetic). ? Surgery. If surgery is needed, it will be done after the swelling is gone. Surgery will stabilize and align the fracture. Follow these instructions at  home:     Activity  Return to your normal activities as told by your health care provider. Ask your health care provider what activities are safe for you.  Avoid contact sports for 3-4 weeks or as told by your health care provider. General instructions  If directed, put ice on the injured area: ? Put ice in a plastic bag. ? Place a towel between your skin and the bag. ? Leave the ice on for 20 minutes, 2-3 times a day.  Take over-the-counter and prescription medicines only as told by your health care provider.  If your nose starts to bleed, sit in an upright position while you squeeze the soft parts of your nose against the dividing wall between your nostrils (septum) for 10 minutes.  Try to avoid blowing your nose.  Keep all follow-up visits as told by your health care provider. This is important. Contact a health care provider if:  Your pain increases or becomes severe.  You continue to have nosebleeds.  The shape of your nose does not return to normal within 5 days.  You have pus draining out of your nose. Get help right away if:  You have bleeding from your nose that does not stop after you pinch your nostrils closed for 20 minutes and keep ice on your nose.  You have clear fluid draining out of your nose.  You notice swelling near the septum inside the nose. This swelling is a septal hematoma that must be   drained to help prevent infection.  You have difficulty moving your eyes.  You have repeated vomiting. Summary  A nasal fracture is a break or crack in the bones or cartilage of the nose.  The fracture is usually caused by a blunt injury to the nose.  Symptoms include pain, swelling, and facial bruising.  Nasal fractures may heal on their own, or your health care provider may need to move the bones back into proper position. In some cases, surgery may be needed. This information is not intended to replace advice given to you by your health care provider. Make  sure you discuss any questions you have with your health care provider. Document Revised: 07/01/2017 Document Reviewed: 07/01/2017 Elsevier Patient Education  2020 [employer]  

## 2020-02-07 DIAGNOSIS — S022XXA Fracture of nasal bones, initial encounter for closed fracture: Secondary | ICD-10-CM | POA: Diagnosis not present

## 2020-02-08 ENCOUNTER — Other Ambulatory Visit: Payer: Self-pay | Admitting: Family

## 2020-02-08 ENCOUNTER — Ambulatory Visit (INDEPENDENT_AMBULATORY_CARE_PROVIDER_SITE_OTHER): Payer: Medicare Other

## 2020-02-08 DIAGNOSIS — M7731 Calcaneal spur, right foot: Secondary | ICD-10-CM | POA: Diagnosis not present

## 2020-02-08 DIAGNOSIS — S92309A Fracture of unspecified metatarsal bone(s), unspecified foot, initial encounter for closed fracture: Secondary | ICD-10-CM

## 2020-02-08 DIAGNOSIS — M79671 Pain in right foot: Secondary | ICD-10-CM

## 2020-02-08 DIAGNOSIS — W19XXXD Unspecified fall, subsequent encounter: Secondary | ICD-10-CM

## 2020-02-08 NOTE — Progress Notes (Unsigned)
Patient fell Thursday 02/03/20 - pain in right foot started after getting home and off the foot later that day  Pain and knot medially aspect of foot.

## 2020-02-09 ENCOUNTER — Telehealth: Payer: Self-pay

## 2020-02-09 NOTE — Telephone Encounter (Signed)
FYI: Pt returned missed call regarding xray results. Reviewed results with pt per Christys note. Pt voiced understanding. [facility] has already reached out to her to schedule an appt but just wanted to get her result before scheduling with them. Pt will call Ortho back to schedule.

## 2020-02-15 ENCOUNTER — Ambulatory Visit (INDEPENDENT_AMBULATORY_CARE_PROVIDER_SITE_OTHER): Payer: Self-pay

## 2020-02-15 ENCOUNTER — Encounter: Payer: Self-pay | Admitting: Orthopedic Surgery

## 2020-02-15 ENCOUNTER — Ambulatory Visit (INDEPENDENT_AMBULATORY_CARE_PROVIDER_SITE_OTHER): Payer: Self-pay | Admitting: Orthopedic Surgery

## 2020-02-15 DIAGNOSIS — M79645 Pain in left finger(s): Secondary | ICD-10-CM

## 2020-02-15 DIAGNOSIS — M79674 Pain in right toe(s): Secondary | ICD-10-CM

## 2020-02-15 NOTE — Progress Notes (Signed)
Office Visit Note   Patient: Wendy Gilbert           Date of Birth: Oct 09, 1960           MRN: [id number] Visit Date: 02/15/2020              Requested by: Sharion Balloon, [place],  [place] [address] PCP: Janora Norlander, DO  Chief Complaint  Patient presents with  . Right Foot - Injury  . Left Hand - Pain      HPI: Patient is a 60 year old woman who is seen in follow-up status post a fall on December 23 she sustained a avulsion fracture off the base of the proximal phalanx right great toe and states she also injured her left little finger and is concerned that she may have also injured the little finger in the fall.  Assessment & Plan: Visit Diagnoses:  1. Pain in left finger(s)   2. Pain of right great toe     Plan: Patient will work on range of motion of the little finger we will place her in a postoperative shoe for the right foot she will use this until the pain resolves recommended Voltaren gel topically for pain.  Follow-Up Instructions: Return if symptoms worsen or fail to improve.   Ortho Exam  Patient is alert, oriented, no adenopathy, well-dressed, normal affect, normal respiratory effort. Examination patient's little finger has intact extension FDS and FDP function of the left little finger.  There is no instability with varus or valgus stressing the joints are congruent she has full passive flexion.  Examination the right foot she has pain palpation of the medial border base of the proximal phalanx great toe she has full passive range of motion of the toe there is no subluxation or dislocation there is no cellulitis no redness no open wounds.  Imaging: XR Finger Little Left  Result Date: 02/15/2020 2 view radiographs of the left little finger shows no avulsion no subluxation or dislocation of the joints no fractures.  No images are attached to the encounter.  Labs: Lab Results  Component Value Date   HGBA1C 7.0 (H) 05/18/2019    ESRSEDRATE 30 (H) 05/03/2014   CRP 1.0 (H) 05/03/2014   REPTSTATUS [id number] FINAL 11/07/2008   REPTSTATUS [id number] FINAL 11/07/2008   GRAMSTAIN  10/25/2008    NO WBC SEEN RARE SQUAMOUS EPITHELIAL CELLS PRESENT NO ORGANISMS SEEN   GRAMSTAIN  10/25/2008    NO WBC SEEN RARE SQUAMOUS EPITHELIAL CELLS PRESENT NO ORGANISMS SEEN   CULT NO GROWTH 5 DAYS 11/07/2008   CULT NO GROWTH 5 DAYS 11/07/2008   LABORGA PSEUDOMONAS AERUGINOSA 10/25/2008   LABORGA STAPHYLOCOCCUS SPECIES (COAGULASE NEGATIVE) 10/25/2008     Lab Results  Component Value Date   ALBUMIN 4.2 06/17/2019   ALBUMIN 4.4 06/11/2019   ALBUMIN 4.3 05/18/2019    Lab Results  Component Value Date   MG 1.5 12/22/2008   MG 2.0 12/06/2008   MG 2.2 11/02/2008   Lab Results  Component Value Date   VD25OH 39.41 06/17/2019   VD25OH 28.8 (L) 06/12/2018   VD25OH 36.7 08/07/2017    No results found for: PREALBUMIN CBC EXTENDED Latest Ref Rng & Units 06/17/2019 05/18/2019 06/12/2018  WBC 4.0 - 10.5 K/uL 6.3 6.7 6.6  RBC 3.87 - 5.11 MIL/uL 4.72 4.78 4.90  HGB 12.0 - 15.0 g/dL 12.9 13.2 13.2  HCT 36.0 - 46.0 % 41.5 39.7 42.6  PLT 150 - 400 K/uL  332 339 251  NEUTROABS 1.7 - 7.7 K/uL 2.9 - 3.6  LYMPHSABS 0.7 - 4.0 K/uL 2.6 - 2.2     There is no height or weight on file to calculate BMI.  Orders:  Orders Placed This Encounter  Procedures  . XR Finger Little Left   No orders of the defined types were placed in this encounter.    Procedures: No procedures performed  Clinical Data: No additional findings.  ROS:  All other systems negative, except as noted in the HPI. Review of Systems  Objective: Vital Signs: There were no vitals taken for this visit.  Specialty Comments:  No specialty comments available.  PMFS History: Patient Active Problem List   Diagnosis Date Noted  . Multiple gastric polyps 01/13/2020  . New onset type 2 diabetes mellitus (HCC) 06/08/2019  . History of breast cancer 04/26/2019  .  Essential hypertension 04/26/2019  . Gastroesophageal reflux disease without esophagitis 04/26/2019  . Lumbar paraspinal muscle spasm 04/26/2019  . Vitamin D deficiency 04/13/2014  . Lymphedema of upper extremity 10/05/2013  . Sinus tachycardia 08/13/2013  . Osteoarthritis of left knee 08/10/2013  . Morbid obesity (HCC) 08/10/2013  . Total knee replacement status 08/10/2013  . DVT (deep venous thrombosis) (HCC) 09/14/2010  . Inflammatory carcinoma of right breast 08/01/2010   Past Medical History:  Diagnosis Date  . Arthritis    "all over"  . Diabetes mellitus without complication (HCC)   . DVT (deep venous thrombosis) (HCC) 10/2008   left arm  . GERD (gastroesophageal reflux disease)   . Headache(784.0)    sinus  . 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 PONV (postoperative nausea and vomiting)   . Seasonal allergies   . Sinusitis   . Vitamin D deficiency 04/13/2014    Family History  Problem Relation Age of Onset  . Breast cancer Mother   . Cancer Mother        breast, colon, ovarian  . Heart disease Father   . Cancer Paternal Uncle        pancreatic  . Deep vein thrombosis Brother   . Aortic aneurysm Brother   . ADD / ADHD Daughter   . Asthma Daughter   . Diabetes Maternal Grandmother   . Stroke Maternal Grandmother   . Hypothyroidism Maternal Grandfather   . Heart disease Maternal Grandfather   . Anuerysm Paternal Grandmother   . Heart attack Paternal Grandfather     Past Surgical History:  Procedure Laterality Date  . BIOPSY  12/13/2019   Procedure: BIOPSY;  Surgeon: Lanelle Bal, DO;  [facility];  Service: Endoscopy;;  gastric polyps  . CESAREAN SECTION  09/1999  . COLONOSCOPY WITH PROPOFOL N/A 12/13/2019   Procedure: COLONOSCOPY WITH PROPOFOL;  Surgeon: Lanelle Bal, DO;  [facility];  Service: Endoscopy;  Laterality: N/A;  12:30pm- patient will arrive as soon as she can.  Marland Kitchen DILATION AND CURETTAGE OF UTERUS  1978   . ESOPHAGOGASTRODUODENOSCOPY (EGD) WITH PROPOFOL N/A 12/13/2019   Procedure: ESOPHAGOGASTRODUODENOSCOPY (EGD) WITH PROPOFOL;  Surgeon: Lanelle Bal, DO;  [facility];  Service: Endoscopy;  Laterality: N/A;  . INCISION AND DRAINAGE ABSCESS  10/25/2008   and debridement left axillary abscess, abd. wall abscess, left thigh abscess  . KNEE ARTHROSCOPY Left 11/1996  . MASTECTOMY Bilateral 02/23/2009   right modified radical, left simple  . MASTECTOMY  2011    bilateral   . POLYPECTOMY  12/13/2019   Procedure: POLYPECTOMY;  Surgeon: [name], DO;  [facility]: [facility];  Service: Endoscopy;;  . PORT-A-CATH REMOVAL Left 05/07/2012   Procedure: REMOVAL PORT-A-CATH;  Surgeon: Odis Hollingshead, MD;  Location: [facility];  Service: General;  Laterality: Left;  . PORTACATH PLACEMENT  08/09/2008  . TOTAL KNEE ARTHROPLASTY Left 08/10/2013   Procedure: LEFT TOTAL KNEE ARTHROPLASTY;  Surgeon: Tobi Bastos, MD;  Location: [facility];  Service: Orthopedics;  Laterality: Left;  . TUBAL LIGATION     Social History   Occupational History  . Occupation: disabled  Tobacco Use  . Smoking status: Never Smoker  . Smokeless tobacco: Never Used  Vaping Use  . Vaping Use: Never used  Substance and Sexual Activity  . Alcohol use: No  . Drug use: No  . Sexual activity: Not Currently

## 2020-02-17 ENCOUNTER — Other Ambulatory Visit: Payer: Self-pay

## 2020-02-17 ENCOUNTER — Encounter: Payer: Self-pay | Admitting: Family Medicine

## 2020-02-17 ENCOUNTER — Ambulatory Visit (INDEPENDENT_AMBULATORY_CARE_PROVIDER_SITE_OTHER): Payer: Medicare Other | Admitting: Family Medicine

## 2020-02-17 VITALS — BP 129/85 | HR 108 | Temp 98.5°F | Ht 64.0 in | Wt 189.4 lb

## 2020-02-17 DIAGNOSIS — S022XXD Fracture of nasal bones, subsequent encounter for fracture with routine healing: Secondary | ICD-10-CM

## 2020-02-17 DIAGNOSIS — I152 Hypertension secondary to endocrine disorders: Secondary | ICD-10-CM

## 2020-02-17 DIAGNOSIS — E1122 Type 2 diabetes mellitus with diabetic chronic kidney disease: Secondary | ICD-10-CM

## 2020-02-17 DIAGNOSIS — Z23 Encounter for immunization: Secondary | ICD-10-CM

## 2020-02-17 DIAGNOSIS — K219 Gastro-esophageal reflux disease without esophagitis: Secondary | ICD-10-CM | POA: Diagnosis not present

## 2020-02-17 DIAGNOSIS — E1159 Type 2 diabetes mellitus with other circulatory complications: Secondary | ICD-10-CM | POA: Diagnosis not present

## 2020-02-17 DIAGNOSIS — N183 Chronic kidney disease, stage 3 unspecified: Secondary | ICD-10-CM | POA: Diagnosis not present

## 2020-02-17 LAB — BAYER DCA HB A1C WAIVED: HB A1C (BAYER DCA - WAIVED): 6.3 % (ref <7.0)

## 2020-02-17 MED ORDER — TRIAMTERENE-HCTZ 75-50 MG PO TABS: 1.0000 | ORAL_TABLET | Freq: Every day | ORAL | 3 refills | Status: DC

## 2020-02-17 MED ORDER — PANTOPRAZOLE SODIUM 40 MG PO TBEC: 40.0000 mg | DELAYED_RELEASE_TABLET | Freq: Every day | ORAL | 3 refills | Status: DC

## 2020-02-17 NOTE — Progress Notes (Signed)
Subjective: CC: Interval checkup, diabetes PCP: Janora Norlander, DO WGY:KZLDJ C Agostini is a 60 y.o. female presenting to clinic today for:  1. Type 2 Diabetes with hypertension, hyperlipidemia:  New onset type 2 diabetes in 2021. She has been working on weight loss, diet modification.  She really has learned some good tips from her nutritionist, which she has implemented into her lifestyle.  Last eye exam: Needs Last foot exam: Up-to-date Last A1c:  Lab Results  Component Value Date   HGBA1C 7.0 (H) 05/18/2019   Nephropathy screen indicated?:  Up-to-date Last flu, zoster and/or pneumovax:  Immunization History  Administered Date(s) Administered  . Influenza,inj,Quad PF,6+ Mos 11/27/2012  . Influenza-Unspecified 12/16/2013, 12/28/2014  . Moderna Sars-Covid-2 Vaccination 05/07/2019, 06/09/2019    ROS: Denies dizziness, LOC, polyuria, polydipsia, unintended weight loss/gain, foot ulcerations, numbness or tingling in extremities, shortness of breath or chest pain.  2. GERD Stable with PPI.  Needs refills  3. fall Apparently patient sustained a major fall in our parking lot around Christmas time.  She fractured her nose, right foot and teeth.  She is currently undergoing treatment for all of these issues.  Overall she seems to be recovering well but has had some issues with her nose.  She still has a small bruise on her left breast area  ROS: Per HPI  Allergies  Allergen Reactions  . Demerol Other (See Comments)    MENTAL STATUS CHANGE  . Erythromycin Nausea And Vomiting  . Adhesive [Tape] Rash  . Cephalexin Hives and Other (See Comments)    ABD. PAIN  . Penicillins Other (See Comments)    UNKNOWN - WAS AS A CHILD   Past Medical History:  Diagnosis Date  . Arthritis    "all over"  . Diabetes mellitus without complication ([place])   . DVT (deep venous thrombosis) ([place]) 10/2008   left arm  . GERD (gastroesophageal reflux disease)   . Headache(784.0)    sinus  .  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 PONV (postoperative nausea and vomiting)   . Seasonal allergies   . Sinusitis   . Vitamin D deficiency 04/13/2014    Current Outpatient Medications:  .  acetaminophen (TYLENOL) 650 MG CR tablet, Take 1,300 mg by mouth at bedtime., Disp: , Rfl:  .  ferrous sulfate 325 (65 FE) MG tablet, Take 325 mg by mouth in the morning and at bedtime.  (Patient not taking: No sig reported), Disp: , Rfl:  .  fluticasone (FLONASE) 50 MCG/ACT nasal spray, Place 1-2 sprays into both nostrils daily as needed (allergies.). As needed, Disp: , Rfl:  .  Magnesium 500 MG CAPS, Take 1,000 mg by mouth at bedtime. , Disp: , Rfl:  .  pantoprazole (PROTONIX) 40 MG tablet, Take 1 tablet (40 mg total) by mouth daily. (Needs to be seen before next refill), Disp: 30 tablet, Rfl: 0 .  triamterene-hydrochlorothiazide (MAXZIDE) 75-50 MG tablet, Take 1 tablet by mouth daily. (Needs to be seen before next refill), Disp: 30 tablet, Rfl: 0 Social History   Socioeconomic History  . Marital status: Married    Spouse name: Ayesha Rumpf  . Number of children: 2  . Years of education: 51  . Highest education level: Some college, no degree  Occupational History  . Occupation: disabled  Tobacco Use  . Smoking status: Never Smoker  . Smokeless tobacco: Never Used  Vaping Use  . Vaping Use: Never used  Substance and Sexual Activity  . Alcohol use: No  .  Drug use: No  . Sexual activity: Not Currently  Other Topics Concern  . Not on file  Social History Narrative  . Not on file   Social Determinants of Health   Financial Resource Strain: Not on file  Food Insecurity: Not on file  Transportation Needs: Not on file  Physical Activity: Not on file  Stress: Not on file  Social Connections: Not on file  Intimate Partner Violence: Not on file   Family History  Problem Relation Age of Onset  . Breast cancer Mother   . Cancer Mother        breast, colon, ovarian  . Heart disease  Father   . Cancer Paternal Uncle        pancreatic  . Deep vein thrombosis Brother   . Aortic aneurysm Brother   . ADD / ADHD Daughter   . Asthma Daughter   . Diabetes Maternal Grandmother   . Stroke Maternal Grandmother   . Hypothyroidism Maternal Grandfather   . Heart disease Maternal Grandfather   . Anuerysm Paternal Grandmother   . Heart attack Paternal Grandfather     Objective: Office vital signs reviewed. BP 129/85   Pulse (!) 108   Temp 98.5 F (36.9 C) (Temporal)   Ht 5' 4"  (1.626 m)   Wt 189 lb 6.4 oz (85.9 kg)   BMI 32.51 kg/m   Physical Examination:  General: Awake, alert, No acute distress HEENT: Normal; sclera white.  Fractured tooth noted superiorly on the right Cardio: regular rate and rhythm, S1S2 heard, no murmurs appreciated Pulm: clear to auscultation bilaterally, no wheezes, rhonchi or rales; normal work of breathing on room air Chest: She has a healing bruise noted along the medial aspect of the lower left breast area MSK: Slow, antalgic gait.  Has a boot in place on the right  Assessment/ Plan: 60 y.o. female   Controlled type 2 diabetes mellitus with stage 3 chronic kidney disease, without long-term current use of insulin ([place]) - Plan: Bayer DCA Hb A1c Waived  Hypertension associated with diabetes ([place]) - Plan: CMP14+EGFR, triamterene-hydrochlorothiazide (MAXZIDE) 75-50 MG tablet  Gastroesophageal reflux disease without esophagitis - Plan: pantoprazole (PROTONIX) 40 MG tablet  Closed fracture of nasal bone with routine healing, subsequent encounter  Need for immunization against influenza - Plan: Flu Vaccine QUAD 36+ mos IM  Her diabetes is under excellent control with diet alone.  Check renal function  Blood pressure under good control.  She is doing an excellent job with lifestyle modification.  Continue current regimen  GERD is stable.  Pantoprazole renewed  She seems to be healing from a nasal fracture standpoint but she is having some  nasal congestion and drainage and therefore she is going to follow-up with the ENT  Influenza vaccine was administered    Orders Placed This Encounter  Procedures  . Flu Vaccine QUAD 36+ mos IM  . Bayer DCA Hb A1c Waived  . CMP14+EGFR   Meds ordered this encounter  Medications  . pantoprazole (PROTONIX) 40 MG tablet    Sig: Take 1 tablet (40 mg total) by mouth daily.    Dispense:  90 tablet    Refill:  3  . triamterene-hydrochlorothiazide (MAXZIDE) 75-50 MG tablet    Sig: Take 1 tablet by mouth daily.    Dispense:  90 tablet    Refill:  [place], [place] [phone]

## 2020-02-18 LAB — CMP14+EGFR
ALT: 11 IU/L (ref 0–32)
AST: 15 IU/L (ref 0–40)
Albumin/Globulin Ratio: 1.4 (ref 1.2–2.2)
Albumin: 4.4 g/dL (ref 3.8–4.8)
Alkaline Phosphatase: 76 IU/L (ref 44–121)
BUN/Creatinine Ratio: 19 (ref 12–28)
BUN: 19 mg/dL (ref 8–27)
Bilirubin Total: 0.5 mg/dL (ref 0.0–1.2)
CO2: 25 mmol/L (ref 20–29)
Calcium: 9.4 mg/dL (ref 8.7–10.3)
Chloride: 98 mmol/L (ref 96–106)
Creatinine, Ser: 1 mg/dL (ref 0.57–1.00)
GFR calc Af Amer: 70 mL/min/{1.73_m2} (ref >59)
GFR calc non Af Amer: 61 mL/min/{1.73_m2} (ref >59)
Globulin, Total: 3.2 g/dL (ref 1.5–4.5)
Glucose: 102 mg/dL — ABNORMAL HIGH (ref 65–99)
Potassium: 4 mmol/L (ref 3.5–5.2)
Sodium: 140 mmol/L (ref 134–144)
Total Protein: 7.6 g/dL (ref 6.0–8.5)

## 2020-03-02 ENCOUNTER — Ambulatory Visit: Payer: Medicare Other | Admitting: Family

## 2020-03-14 ENCOUNTER — Other Ambulatory Visit: Payer: Medicare Other

## 2020-03-14 ENCOUNTER — Other Ambulatory Visit: Payer: Self-pay

## 2020-03-14 DIAGNOSIS — N170 Acute kidney failure with tubular necrosis: Secondary | ICD-10-CM | POA: Diagnosis not present

## 2020-03-14 DIAGNOSIS — I129 Hypertensive chronic kidney disease with stage 1 through stage 4 chronic kidney disease, or unspecified chronic kidney disease: Secondary | ICD-10-CM | POA: Diagnosis not present

## 2020-03-14 DIAGNOSIS — N182 Chronic kidney disease, stage 2 (mild): Secondary | ICD-10-CM | POA: Diagnosis not present

## 2020-03-14 DIAGNOSIS — E611 Iron deficiency: Secondary | ICD-10-CM | POA: Diagnosis not present

## 2020-03-30 DIAGNOSIS — E611 Iron deficiency: Secondary | ICD-10-CM | POA: Diagnosis not present

## 2020-03-30 DIAGNOSIS — I129 Hypertensive chronic kidney disease with stage 1 through stage 4 chronic kidney disease, or unspecified chronic kidney disease: Secondary | ICD-10-CM | POA: Diagnosis not present

## 2020-03-30 DIAGNOSIS — N182 Chronic kidney disease, stage 2 (mild): Secondary | ICD-10-CM | POA: Diagnosis not present

## 2020-06-08 ENCOUNTER — Encounter: Payer: Self-pay | Admitting: *Deleted

## 2020-06-16 ENCOUNTER — Inpatient Hospital Stay (HOSPITAL_COMMUNITY): Payer: Medicare Other | Attending: Hematology

## 2020-06-16 ENCOUNTER — Other Ambulatory Visit: Payer: Self-pay

## 2020-06-16 DIAGNOSIS — M25512 Pain in left shoulder: Secondary | ICD-10-CM | POA: Diagnosis not present

## 2020-06-16 DIAGNOSIS — Z86718 Personal history of other venous thrombosis and embolism: Secondary | ICD-10-CM | POA: Insufficient documentation

## 2020-06-16 DIAGNOSIS — Z9221 Personal history of antineoplastic chemotherapy: Secondary | ICD-10-CM | POA: Insufficient documentation

## 2020-06-16 DIAGNOSIS — Z9013 Acquired absence of bilateral breasts and nipples: Secondary | ICD-10-CM | POA: Insufficient documentation

## 2020-06-16 DIAGNOSIS — Z923 Personal history of irradiation: Secondary | ICD-10-CM | POA: Insufficient documentation

## 2020-06-16 DIAGNOSIS — E559 Vitamin D deficiency, unspecified: Secondary | ICD-10-CM | POA: Diagnosis not present

## 2020-06-16 DIAGNOSIS — M1990 Unspecified osteoarthritis, unspecified site: Secondary | ICD-10-CM | POA: Insufficient documentation

## 2020-06-16 DIAGNOSIS — M25511 Pain in right shoulder: Secondary | ICD-10-CM | POA: Diagnosis not present

## 2020-06-16 DIAGNOSIS — Z853 Personal history of malignant neoplasm of breast: Secondary | ICD-10-CM | POA: Insufficient documentation

## 2020-06-16 DIAGNOSIS — Z79899 Other long term (current) drug therapy: Secondary | ICD-10-CM | POA: Insufficient documentation

## 2020-06-16 DIAGNOSIS — C50911 Malignant neoplasm of unspecified site of right female breast: Secondary | ICD-10-CM

## 2020-06-16 DIAGNOSIS — E119 Type 2 diabetes mellitus without complications: Secondary | ICD-10-CM | POA: Diagnosis not present

## 2020-06-16 DIAGNOSIS — R5383 Other fatigue: Secondary | ICD-10-CM | POA: Diagnosis not present

## 2020-06-16 LAB — CBC WITH DIFFERENTIAL/PLATELET
Abs Immature Granulocytes: 0.02 10*3/uL (ref 0.00–0.07)
Basophils Absolute: 0.1 10*3/uL (ref 0.0–0.1)
Basophils Relative: 1 %
Eosinophils Absolute: 0.2 10*3/uL (ref 0.0–0.5)
Eosinophils Relative: 3 %
HCT: 39.2 % (ref 36.0–46.0)
Hemoglobin: 12.5 g/dL (ref 12.0–15.0)
Immature Granulocytes: 0 %
Lymphocytes Relative: 46 %
Lymphs Abs: 2.4 10*3/uL (ref 0.7–4.0)
MCH: 27.6 pg (ref 26.0–34.0)
MCHC: 31.9 g/dL (ref 30.0–36.0)
MCV: 86.5 fL (ref 80.0–100.0)
Monocytes Absolute: 0.5 10*3/uL (ref 0.1–1.0)
Monocytes Relative: 9 %
Neutro Abs: 2.1 10*3/uL (ref 1.7–7.7)
Neutrophils Relative %: 41 %
Platelets: 300 10*3/uL (ref 150–400)
RBC: 4.53 MIL/uL (ref 3.87–5.11)
RDW: 13.8 % (ref 11.5–15.5)
WBC: 5.2 10*3/uL (ref 4.0–10.5)
nRBC: 0 % (ref 0.0–0.2)

## 2020-06-16 LAB — COMPREHENSIVE METABOLIC PANEL
ALT: 15 U/L (ref 0–44)
AST: 21 U/L (ref 15–41)
Albumin: 4.1 g/dL (ref 3.5–5.0)
Alkaline Phosphatase: 64 U/L (ref 38–126)
Anion gap: 8 (ref 5–15)
BUN: 17 mg/dL (ref 8–23)
CO2: 27 mmol/L (ref 22–32)
Calcium: 9.1 mg/dL (ref 8.9–10.3)
Chloride: 100 mmol/L (ref 98–111)
Creatinine, Ser: 0.84 mg/dL (ref 0.44–1.00)
GFR, Estimated: >60 mL/min (ref >60)
Glucose, Bld: 103 mg/dL — ABNORMAL HIGH (ref 70–99)
Potassium: 3.3 mmol/L — ABNORMAL LOW (ref 3.5–5.1)
Sodium: 135 mmol/L (ref 135–145)
Total Bilirubin: 0.5 mg/dL (ref 0.3–1.2)
Total Protein: 8.1 g/dL (ref 6.5–8.1)

## 2020-06-16 LAB — VITAMIN D 25 HYDROXY (VIT D DEFICIENCY, FRACTURES): Vit D, 25-Hydroxy: 29.29 ng/mL — ABNORMAL LOW (ref 30–100)

## 2020-06-23 ENCOUNTER — Ambulatory Visit (HOSPITAL_COMMUNITY): Payer: Medicare Other

## 2020-06-26 ENCOUNTER — Other Ambulatory Visit: Payer: Self-pay

## 2020-06-26 ENCOUNTER — Inpatient Hospital Stay (HOSPITAL_COMMUNITY): Payer: Medicare Other | Admitting: Hematology

## 2020-06-26 VITALS — BP 139/83 | HR 90 | Temp 97.0°F | Resp 18 | Wt 189.0 lb

## 2020-06-26 DIAGNOSIS — M25512 Pain in left shoulder: Secondary | ICD-10-CM | POA: Diagnosis not present

## 2020-06-26 DIAGNOSIS — Z9013 Acquired absence of bilateral breasts and nipples: Secondary | ICD-10-CM | POA: Diagnosis not present

## 2020-06-26 DIAGNOSIS — Z86718 Personal history of other venous thrombosis and embolism: Secondary | ICD-10-CM | POA: Diagnosis not present

## 2020-06-26 DIAGNOSIS — E559 Vitamin D deficiency, unspecified: Secondary | ICD-10-CM

## 2020-06-26 DIAGNOSIS — M1990 Unspecified osteoarthritis, unspecified site: Secondary | ICD-10-CM | POA: Diagnosis not present

## 2020-06-26 DIAGNOSIS — C50911 Malignant neoplasm of unspecified site of right female breast: Secondary | ICD-10-CM

## 2020-06-26 DIAGNOSIS — M25511 Pain in right shoulder: Secondary | ICD-10-CM | POA: Diagnosis not present

## 2020-06-26 DIAGNOSIS — E119 Type 2 diabetes mellitus without complications: Secondary | ICD-10-CM | POA: Diagnosis not present

## 2020-06-26 DIAGNOSIS — R5383 Other fatigue: Secondary | ICD-10-CM | POA: Diagnosis not present

## 2020-06-26 DIAGNOSIS — Z853 Personal history of malignant neoplasm of breast: Secondary | ICD-10-CM | POA: Diagnosis not present

## 2020-06-26 DIAGNOSIS — Z9221 Personal history of antineoplastic chemotherapy: Secondary | ICD-10-CM | POA: Diagnosis not present

## 2020-06-26 DIAGNOSIS — Z923 Personal history of irradiation: Secondary | ICD-10-CM | POA: Diagnosis not present

## 2020-06-26 DIAGNOSIS — Z79899 Other long term (current) drug therapy: Secondary | ICD-10-CM | POA: Diagnosis not present

## 2020-06-26 NOTE — Patient Instructions (Signed)
[place] at [facility] Discharge Instructions  You were seen today by Dr. Delton Coombes. He went over your recent results. Take vitamin D + calcium daily. Dr. Delton Coombes will see you back in 1 year for labs and follow up.   Thank you for choosing [facility] at [facility] to provide your oncology and hematology care.  To afford each patient quality time with our provider, please arrive at least 15 minutes before your scheduled appointment time.   If you have a lab appointment with the [facility] please come in thru the Main Entrance and check in at the main information desk  You need to re-schedule your appointment should you arrive 10 or more minutes late.  We strive to give you quality time with our providers, and arriving late affects you and other patients whose appointments are after yours.  Also, if you no show three or more times for appointments you may be dismissed from the clinic at the providers discretion.     Again, thank you for choosing [facility].  Our hope is that these requests will decrease the amount of time that you wait before being seen by our physicians.       _____________________________________________________________  Should you have questions after your visit to [facility], please contact our office at [phone] between the hours of 8:00 a.m. and 4:30 p.m.  Voicemails left after 4:00 p.m. will not be returned until the following business day.  For prescription refill requests, have your pharmacy contact our office and allow 72 hours.    Cancer Center Support Programs:   > Cancer Support Group  2nd Tuesday of the month 1pm-2pm, Journey Room

## 2020-06-26 NOTE — Progress Notes (Addendum)
[place], [place] [address]   CLINIC:  Medical Oncology/Hematology  PCP:  Janora Norlander, DO [address] [place] [address] [phone]   REASON FOR VISIT:  Follow-up for right breast cancer  PRIOR THERAPY:  -FEC x5 cycles followed by carboplatin and Taxotere for 4 cycles -Bilateral mastectomy, YPT0Y PN 0 on 02/23/2009 -Completed XRT in March 2011  NGS Results: not done  CURRENT THERAPY: surveillance  BRIEF ONCOLOGIC HISTORY:  Oncology History  Inflammatory carcinoma of right breast  08/01/2010 Initial Diagnosis   Inflammatory carcinoma of right breast     CANCER STAGING: Cancer Staging Inflammatory carcinoma of right breast Staging form: Breast, AJCC 7th Edition - Clinical: Stage IIIC (T4d, N3c, cM0) - Signed by Baird Cancer, PA on 08/01/2010   INTERVAL HISTORY:  Ms. Wendy Gilbert, a 60 y.o. female, returns for routine follow-up of her right breast cancer. Wendy Gilbert was last seen on 06/26/2020.   Today she reports feeling well. She had an endoscopy 12/13/2019. She reports worsened shoulder pain in both shoulders. She reports limited ROM in right shoulder coupled with a popping noise. She used to work at a daycare center, and prior to that she worked at [employer]. She has had mild lymphedema in right arm.   REVIEW OF SYSTEMS:  Review of Systems  Constitutional: Positive for fatigue (50%). Negative for appetite change.  Musculoskeletal: Positive for arthralgias (both shoulders 8/10).  Neurological: Positive for numbness (hands).  All other systems reviewed and are negative.   PAST MEDICAL/SURGICAL HISTORY:  Past Medical History:  Diagnosis Date  . Arthritis    "all over"  . Diabetes mellitus without complication ([facility])   . DVT (deep venous thrombosis) ([place]) 10/2008   left arm  . GERD (gastroesophageal reflux disease)   . Headache(784.0)    sinus  . 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 PONV  (postoperative nausea and vomiting)   . Seasonal allergies   . Sinusitis   . Vitamin D deficiency 04/13/2014   Past Surgical History:  Procedure Laterality Date  . BIOPSY  12/13/2019   Procedure: BIOPSY;  Surgeon: [name], DO;  Location: [facility];  Service: Endoscopy;;  gastric polyps  . CESAREAN SECTION  09/1999  . COLONOSCOPY WITH PROPOFOL N/A 12/13/2019   Procedure: COLONOSCOPY WITH PROPOFOL;  Surgeon: [name], DO;  Location: [facility];  Service: Endoscopy;  Laterality: N/A;  12:30pm- patient will arrive as soon as she can.  Marland Kitchen DILATION AND CURETTAGE OF UTERUS  1978  . ESOPHAGOGASTRODUODENOSCOPY (EGD) WITH PROPOFOL N/A 12/13/2019   Procedure: ESOPHAGOGASTRODUODENOSCOPY (EGD) WITH PROPOFOL;  Surgeon: [name], DO;  [facility];  Service: Endoscopy;  Laterality: N/A;  . INCISION AND DRAINAGE ABSCESS  10/25/2008   and debridement left axillary abscess, abd. wall abscess, left thigh abscess  . KNEE ARTHROSCOPY Left 11/1996  . MASTECTOMY Bilateral 02/23/2009   right modified radical, left simple  . MASTECTOMY  2011    bilateral   . POLYPECTOMY  12/13/2019   Procedure: POLYPECTOMY;  Surgeon: [name], DO;  Location: [facility];  Service: Endoscopy;;  . PORT-A-CATH REMOVAL Left 05/07/2012   Procedure: REMOVAL PORT-A-CATH;  Surgeon: Odis Hollingshead, MD;  Location: [facility];  Service: General;  Laterality: Left;  . PORTACATH PLACEMENT  08/09/2008  . TOTAL KNEE ARTHROPLASTY Left 08/10/2013   Procedure: LEFT TOTAL KNEE ARTHROPLASTY;  Surgeon: Kipp Brood  Gioffre, MD;  Location: [facility];  Service: Orthopedics;  Laterality: Left;  . TUBAL LIGATION      SOCIAL HISTORY:  Social History   Socioeconomic History  . Marital status: Married    Spouse name: Ayesha Rumpf  . Number of children: 2  . Years of education: 8  . Highest education level: Some college, no degree  Occupational History  . Occupation: disabled  Tobacco Use  .  Smoking status: Never Smoker  . Smokeless tobacco: Never Used  Vaping Use  . Vaping Use: Never used  Substance and Sexual Activity  . Alcohol use: No  . Drug use: No  . Sexual activity: Not Currently  Other Topics Concern  . Not on file  Social History Narrative  . Not on file   Social Determinants of Health   Financial Resource Strain: Not on file  Food Insecurity: Not on file  Transportation Needs: Not on file  Physical Activity: Not on file  Stress: Not on file  Social Connections: Not on file  Intimate Partner Violence: Not on file    FAMILY HISTORY:  Family History  Problem Relation Age of Onset  . Breast cancer Mother   . Cancer Mother        breast, colon, ovarian  . Heart disease Father   . Cancer Paternal Uncle        pancreatic  . Deep vein thrombosis Brother   . Aortic aneurysm Brother   . ADD / ADHD Daughter   . Asthma Daughter   . Diabetes Maternal Grandmother   . Stroke Maternal Grandmother   . Hypothyroidism Maternal Grandfather   . Heart disease Maternal Grandfather   . Anuerysm Paternal Grandmother   . Heart attack Paternal Grandfather     CURRENT MEDICATIONS:  Current Outpatient Medications  Medication Sig Dispense Refill  . acetaminophen (TYLENOL) 650 MG CR tablet Take 1,300 mg by mouth at bedtime.    . fluticasone (FLONASE) 50 MCG/ACT nasal spray Place 1-2 sprays into both nostrils daily as needed (allergies.). As needed    . Magnesium 500 MG CAPS Take 1,000 mg by mouth at bedtime.     . pantoprazole (PROTONIX) 40 MG tablet Take 1 tablet (40 mg total) by mouth daily. 90 tablet 3  . triamterene-hydrochlorothiazide (MAXZIDE) 75-50 MG tablet Take 1 tablet by mouth daily. 90 tablet 3   No current facility-administered medications for this visit.    ALLERGIES:  Allergies  Allergen Reactions  . Demerol Other (See Comments)    MENTAL STATUS CHANGE  . Erythromycin Nausea And Vomiting  . Adhesive [Tape] Rash  . Cephalexin Hives and Other (See  Comments)    ABD. PAIN  . Penicillins Other (See Comments)    UNKNOWN - WAS AS A CHILD    PHYSICAL EXAM:  Performance status (ECOG): 1 - Symptomatic but completely ambulatory  There were no vitals filed for this visit. Wt Readings from Last 3 Encounters:  02/17/20 189 lb 6.4 oz (85.9 kg)  02/03/20 190 lb (86.2 kg)  01/13/20 190 lb 3.2 oz (86.3 kg)   Physical Exam Vitals reviewed.  Constitutional:      Appearance: Normal appearance.  Cardiovascular:     Rate and Rhythm: Normal rate and regular rhythm.     Pulses: Normal pulses.     Heart sounds: Normal heart sounds.  Pulmonary:     Effort: Pulmonary effort is normal.     Breath sounds: Normal breath sounds.  Chest:  Breasts:     Right:  Absent. No skin change (mastectomy site within normal limits) or axillary adenopathy.     Left: Absent. No skin change (mastectomy site within normal limits) or axillary adenopathy.    Abdominal:     Palpations: Abdomen is soft. There is no hepatomegaly, splenomegaly or mass.     Tenderness: There is no abdominal tenderness.  Musculoskeletal:     Right lower leg: No edema.     Left lower leg: No edema.  Lymphadenopathy:     Upper Body:     Right upper body: No axillary or pectoral adenopathy.     Left upper body: No axillary or pectoral adenopathy.  Neurological:     General: No focal deficit present.     Mental Status: She is alert and oriented to person, place, and time.  Psychiatric:        Mood and Affect: Mood normal.        Behavior: Behavior normal.      LABORATORY DATA:  I have reviewed the labs as listed.  CBC Latest Ref Rng & Units 06/16/2020 06/17/2019 05/18/2019  WBC 4.0 - 10.5 K/uL 5.2 6.3 6.7  Hemoglobin 12.0 - 15.0 g/dL 12.5 12.9 13.2  Hematocrit 36.0 - 46.0 % 39.2 41.5 39.7  Platelets 150 - 400 K/uL 300 332 339   CMP Latest Ref Rng & Units 06/16/2020 02/17/2020 12/10/2019  Glucose 70 - 99 mg/dL 103(H) 102(H) 125(H)  BUN 8 - 23 mg/dL 17 19 25(H)  Creatinine 0.44 - 1.00  mg/dL 0.84 1.00 1.01(H)  Sodium 135 - 145 mmol/L 135 140 136  Potassium 3.5 - 5.1 mmol/L 3.3(L) 4.0 3.7  Chloride 98 - 111 mmol/L 100 98 99  CO2 22 - 32 mmol/L 27 25 25   Calcium 8.9 - 10.3 mg/dL 9.1 9.4 9.4  Total Protein 6.5 - 8.1 g/dL 8.1 7.6 -  Total Bilirubin 0.3 - 1.2 mg/dL 0.5 0.5 -  Alkaline Phos 38 - 126 U/L 64 76 -  AST 15 - 41 U/L 21 15 -  ALT 0 - 44 U/L 15 11 -    DIAGNOSTIC IMAGING:  I have independently reviewed the scans and discussed with the patient. No results found.   ASSESSMENT:  1.  Stage IIIc inflammatory right breast cancer, triple negative: - Presentation with positive supraclavicular, infraclavicular, axillary nodes with eroding mass in the upper outer quadrant of the right breast, 12 x 11.5 cm, status post FEC x5 cycles, followed by carboplatin and Taxotere for 4 cycles, followed by bilateral mastectomies, Y PT 0, ypN0 on 02/23/2009. -Completed XRT in March 2011.   2.  Left upper extremity DVT: -Port-A-Cath induced and was treated for 6 months.  Port was removed.  3.  Fatty liver:  -Previous ultrasound showed fatty liver.    PLAN:  1.  Stage IIIc inflammatory right breast cancer, triple negative: - Bilateral mastectomy sites are within normal limits.  No palpable adenopathy in the bilateral axillary and supraclavicular regions. - Reviewed labs from 06/16/2020 which showed normal LFTs and chemistry.  CBC was grossly normal. - RTC 1 year for follow-up. - She is having bilateral shoulder pains, right more than left.  I have recommended follow-up with orthopedics.  2.  Vitamin D deficiency: - Vitamin D level is 29. - She is not taking any vitamin D.  I have recommended starting calcium plus vitamin D daily.  We will repeat vitamin D level in a year.  Orders placed this encounter:  No orders of the defined types were placed in  this encounter.    Derek Jack, MD [place] [phone]   I, Thana Ates, am acting as a  scribe for Dr. Derek Jack.  I, Derek Jack MD, have reviewed the above documentation for accuracy and completeness, and I agree with the above.

## 2020-06-28 ENCOUNTER — Telehealth: Payer: Self-pay

## 2020-06-28 ENCOUNTER — Ambulatory Visit (INDEPENDENT_AMBULATORY_CARE_PROVIDER_SITE_OTHER): Payer: Medicare Other

## 2020-06-28 VITALS — Ht 64.0 in | Wt 189.0 lb

## 2020-06-28 DIAGNOSIS — Z0000 Encounter for general adult medical examination without abnormal findings: Secondary | ICD-10-CM | POA: Diagnosis not present

## 2020-06-28 NOTE — Progress Notes (Signed)
Subjective:   RUSSIA CIABURRI is a 60 y.o. female who presents for Medicare Annual (Subsequent) preventive examination.  Virtual Visit via Telephone Note  I connected with  Sovanna Luquette Newcom on 06/28/20 at  2:45 PM EDT by telephone and verified that I am speaking with the correct person using two identifiers.  Location: Patient: Home Provider: [facility] Persons participating in the virtual visit: patient/Nurse Health Advisor   I discussed the limitations, risks, security and privacy concerns of performing an evaluation and management service by telephone and the availability of in person appointments. The patient expressed understanding and agreed to proceed.  Interactive audio and video telecommunications were attempted between this nurse and patient, however failed, due to patient having technical difficulties OR patient did not have access to video capability.  We continued and completed visit with audio only.  Some vital signs may be absent or patient reported.   Jnya Brossard E Armend Hochstatter, LPN   Review of Systems     Cardiac Risk Factors include: advanced age (>90men, >57 women);obesity (BMI >30kg/m2);sedentary lifestyle;diabetes mellitus;dyslipidemia;hypertension     Objective:    Today's Vitals   06/28/20 1433  Weight: 189 lb (85.7 kg)  Height: 5\' 4"  (1.626 m)  PainSc: 8    Body mass index is 32.44 kg/m.  Advanced Directives 06/28/2020 06/26/2020 12/13/2019 06/28/2019 06/24/2019 06/19/2018 06/11/2017  Does Patient Have a Medical Advance Directive? No No No No No No No  Would patient like information on creating a medical advance directive? No - Patient declined Yes (MAU/Ambulatory/Procedural Areas - Information given) No - Patient declined No - Patient declined Yes (MAU/Ambulatory/Procedural Areas - Information given) No - Patient declined No - Patient declined  Pre-existing out of facility DNR order (yellow form or pink MOST form) - - - - - - -    Current Medications (verified) Outpatient  Encounter Medications as of 06/28/2020  Medication Sig  . acetaminophen (TYLENOL) 650 MG CR tablet Take 1,300 mg by mouth at bedtime.  . Calcium Carb-Cholecalciferol (CALCIUM 1000 + D) 1000-800 MG-UNIT TABS Take by mouth.  . fluticasone (FLONASE) 50 MCG/ACT nasal spray Place 1-2 sprays into both nostrils daily as needed (allergies.). As needed  . Magnesium 500 MG CAPS Take 1,000 mg by mouth at bedtime.   Glory Rosebush Delica Lancets 99991111 MISC Apply topically.  Glory Rosebush VERIO test strip 1 each daily.  . pantoprazole (PROTONIX) 40 MG tablet Take 1 tablet (40 mg total) by mouth daily.  Marland Kitchen triamterene-hydrochlorothiazide (MAXZIDE) 75-50 MG tablet Take 1 tablet by mouth daily.   No facility-administered encounter medications on file as of 06/28/2020.    Allergies (verified) Demerol, Erythromycin, Adhesive [tape], Cephalexin, and Penicillins   History: Past Medical History:  Diagnosis Date  . Arthritis    "all over"  . Diabetes mellitus without complication ([place])   . DVT (deep venous thrombosis) ([place]) 10/2008   left arm  . GERD (gastroesophageal reflux disease)   . Headache(784.0)    sinus  . 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 PONV (postoperative nausea and vomiting)   . Seasonal allergies   . Sinusitis   . Vitamin D deficiency 04/13/2014   Past Surgical History:  Procedure Laterality Date  . BIOPSY  12/13/2019   Procedure: BIOPSY;  Surgeon: [name], DO;  [facility];  Service: Endoscopy;;  gastric polyps  . CESAREAN SECTION  09/1999  . COLONOSCOPY WITH PROPOFOL N/A 12/13/2019   Procedure: COLONOSCOPY WITH PROPOFOL;  Surgeon: [name],  DO;  [facility];  Service: Endoscopy;  Laterality: N/A;  12:30pm- patient will arrive as soon as she can.  Marland Kitchen DILATION AND CURETTAGE OF UTERUS  1978  . ESOPHAGOGASTRODUODENOSCOPY (EGD) WITH PROPOFOL N/A 12/13/2019   Procedure: ESOPHAGOGASTRODUODENOSCOPY (EGD) WITH PROPOFOL;  Surgeon: [name], DO;  [facility];  Service: Endoscopy;  Laterality: N/A;  . INCISION AND DRAINAGE ABSCESS  10/25/2008   and debridement left axillary abscess, abd. wall abscess, left thigh abscess  . KNEE ARTHROSCOPY Left 11/1996  . MASTECTOMY Bilateral 02/23/2009   right modified radical, left simple  . MASTECTOMY  2011    bilateral   . POLYPECTOMY  12/13/2019   Procedure: POLYPECTOMY;  Surgeon: [name], DO;  [facility];  Service: Endoscopy;;  . PORT-A-CATH REMOVAL Left 05/07/2012   Procedure: REMOVAL PORT-A-CATH;  Surgeon: Odis Hollingshead, MD;  Location: [facility] [place];  Service: General;  Laterality: Left;  . PORTACATH PLACEMENT  08/09/2008  . TOTAL KNEE ARTHROPLASTY Left 08/10/2013   Procedure: LEFT TOTAL KNEE ARTHROPLASTY;  Surgeon: Tobi Bastos, MD;  Location: [facility];  Service: Orthopedics;  Laterality: Left;  . TUBAL LIGATION     Family History  Problem Relation Age of Onset  . Breast cancer Mother   . Cancer Mother        breast, colon, ovarian  . Heart disease Father   . Cancer Paternal Uncle        pancreatic  . Deep vein thrombosis Brother   . Aortic aneurysm Brother   . ADD / ADHD Daughter   . Asthma Daughter   . Diabetes Maternal Grandmother   . Stroke Maternal Grandmother   . Hypothyroidism Maternal Grandfather   . Heart disease Maternal Grandfather   . Anuerysm Paternal Grandmother   . Heart attack Paternal Grandfather    Social History   Socioeconomic History  . Marital status: Married    Spouse name: Ayesha Rumpf  . Number of children: 2  . Years of education: 9  . Highest education level: Some college, no degree  Occupational History  . Occupation: disabled  Tobacco Use  . Smoking status: Never Smoker  . Smokeless tobacco: Never Used  Vaping Use  . Vaping Use: Never used  Substance and Sexual Activity  . Alcohol use: No  . Drug use: No  . Sexual activity: Not Currently  Other Topics Concern  . Not on file  Social  History Narrative  . Not on file   Social Determinants of Health   Financial Resource Strain: Low Risk   . Difficulty of Paying Living Expenses: Not hard at all  Food Insecurity: No Food Insecurity  . Worried About Charity fundraiser in the Last Year: Never true  . Ran Out of Food in the Last Year: Never true  Transportation Needs: No Transportation Needs  . Lack of Transportation (Medical): No  . Lack of Transportation (Non-Medical): No  Physical Activity: Insufficiently Active  . Days of Exercise per Week: 7 days  . Minutes of Exercise per Session: 10 min  Stress: No Stress Concern Present  . Feeling of Stress : Not at all  Social Connections: Moderately Isolated  . Frequency of Communication with Friends and Family: More than three times a week  . Frequency of Social Gatherings with Friends and Family: More than three times a week  . Attends Religious Services: Never  . Active Member of Clubs or Organizations: No  . Attends Club  or Organization Meetings: Never  . Marital Status: Married    Tobacco Counseling Counseling given: Not Answered   Clinical Intake:  Pre-visit preparation completed: Yes  Pain : 0-10 Pain Score: 8  Pain Type: Chronic pain Pain Location: Shoulder Pain Orientation: Right Pain Descriptors / Indicators: Aching,Sharp Pain Onset: More than a month ago Pain Frequency: Constant     BMI - recorded: 32.44 Nutritional Status: BMI > 30  Obese Nutritional Risks: None Diabetes: Yes CBG done?: No Did pt. bring in CBG monitor from home?: No  How often do you need to have someone help you when you read instructions, pamphlets, or other written materials from your doctor or pharmacy?: 1 - Never  Nutrition Risk Assessment:  Has the patient had any N/V/D within the last 2 months?  No  Does the patient have any non-healing wounds?  No  Has the patient had any unintentional weight loss or weight gain?  No   Diabetes:  Is the patient diabetic?  Yes   If diabetic, was a CBG obtained today?  No  Did the patient bring in their glucometer from home?  No  How often do you monitor your CBG's? Once daily.   Financial Strains and Diabetes Management:  Are you having any financial strains with the device, your supplies or your medication? No .  Does the patient want to be seen by Chronic Care Management for management of their diabetes?  No  Would the patient like to be referred to a Nutritionist or for Diabetic Management?  No   Diabetic Exams:  Diabetic Eye Exam: Overdue for diabetic eye exam. Pt has been advised about the importance in completing this exam. Advised patient to make appt or walk in at [facility] soon  Diabetic Foot Exam: Completed 06/08/2019. Pt has been advised about the importance in completing this exam. Pt is scheduled for diabetic foot exam on 08/21/2020.    Interpreter Needed?: No  Information entered by :: Shakiya Mcneary, LPN   Activities of Daily Living In your present state of health, do you have any difficulty performing the following activities: 06/28/2020  Hearing? N  Vision? N  Difficulty concentrating or making decisions? N  Walking or climbing stairs? N  Dressing or bathing? N  Doing errands, shopping? N  Preparing Food and eating ? N  Using the Toilet? N  In the past six months, have you accidently leaked urine? N  Do you have problems with loss of bowel control? N  Managing your Medications? N  Managing your Finances? N  Housekeeping or managing your Housekeeping? N  Some recent data might be hidden    Patient Care Team: Janora Norlander, DO as PCP - General (Family Medicine) Tyler Pita, MD as Consulting Physician (Radiation Oncology) [name], DO as Consulting Physician (Internal Medicine) Derek Jack, MD as Consulting Physician (Hematology) [name], DO as Consulting Physician (Gastroenterology) Liana Gerold, MD as Consulting Physician  (Nephrology)  Indicate any recent Medical Services you may have received from other than [facility] providers in the past year (date may be approximate).     Assessment:   This is a routine wellness examination for Jimi.  Hearing/Vision screen  Hearing Screening   125Hz  250Hz  500Hz  1000Hz  2000Hz  3000Hz  4000Hz  6000Hz  8000Hz   Right ear:           Left ear:           Comments: Denies hearing difficulties  Vision Screening Comments: No eye doctor -  wears otc reading glasses  Dietary issues and exercise activities discussed: Current Exercise Habits: Home exercise routine, Type of exercise: walking, Time (Minutes): 10, Frequency (Times/Week): 7, Weekly Exercise (Minutes/Week): 70, Intensity: Mild, Exercise limited by: orthopedic condition(s)  Goals Addressed   None    Depression Screen PHQ 2/9 Scores 06/28/2020 02/17/2020 02/03/2020 08/12/2019 06/28/2019 06/08/2019 04/26/2019  PHQ - 2 Score 0 0 0 4 0 0 0  PHQ- 9 Score - - - 6 - - -    Fall Risk Fall Risk  06/28/2020 02/17/2020 08/12/2019 06/28/2019 06/08/2019  Falls in the past year? 1 1 0 1 1  Number falls in past yr: 1 0 0 0 0  Injury with Fall? 1 1 0 0 0  Risk for fall due to : History of fall(s);Orthopedic patient;Medication side effect History of fall(s) - History of fall(s) No Fall Risks  Follow up Falls prevention discussed;Education provided Falls evaluation completed - Falls prevention discussed Falls prevention discussed    FALL RISK PREVENTION PERTAINING TO THE HOME:  Any stairs in or around the home? Yes  If so, are there any without handrails? No  Home free of loose throw rugs in walkways, pet beds, electrical cords, etc? Yes  Adequate lighting in your home to reduce risk of falls? Yes   ASSISTIVE DEVICES UTILIZED TO PREVENT FALLS:  Life alert? No  Use of a cane, walker or w/c? No  Grab bars in the bathroom? No  Shower chair or bench in shower? No  Elevated toilet seat or a handicapped toilet? No   TIMED UP AND GO:  Was the  test performed? No . Telephonic visit.  Cognitive Function: Normal cognitive status assessed by direct observation by this Nurse Health Advisor. No abnormalities found.      6CIT Screen 06/28/2020 06/28/2019  What Year? - 0 points  What month? - 0 points  What time? 0 points 0 points  Count back from 20 0 points 0 points  Months in reverse 0 points 0 points  Repeat phrase 0 points 0 points  Total Score - 0    Immunizations Immunization History  Administered Date(s) Administered  . Influenza,inj,Quad PF,6+ Mos 11/27/2012, 02/17/2020  . Influenza-Unspecified 12/16/2013, 12/28/2014  . Moderna Sars-Covid-2 Vaccination 05/07/2019, 06/09/2019    TDAP status: Due, Education has been provided regarding the importance of this vaccine. Advised may receive this vaccine at local pharmacy or Health Dept. Aware to provide a copy of the vaccination record if obtained from local pharmacy or Health Dept. Verbalized acceptance and understanding.  Flu Vaccine status: Up to date  Pneumococcal vaccine status: Due, Education has been provided regarding the importance of this vaccine. Advised may receive this vaccine at local pharmacy or Health Dept. Aware to provide a copy of the vaccination record if obtained from local pharmacy or Health Dept. Verbalized acceptance and understanding.  Covid-19 vaccine status: Completed vaccines  Qualifies for Shingles Vaccine? Yes   Zostavax completed No   Shingrix Completed?: No.    Education has been provided regarding the importance of this vaccine. Patient has been advised to call insurance company to determine out of pocket expense if they have not yet received this vaccine. Advised may also receive vaccine at local pharmacy or Health Dept. Verbalized acceptance and understanding.  Screening Tests Health Maintenance  Topic Date Due  . PNEUMOCOCCAL POLYSACCHARIDE VACCINE AGE 110-64 HIGH RISK  Never done  . OPHTHALMOLOGY EXAM  Never done  . PAP SMEAR-Modifier   Never done  . COVID-19 Vaccine (  3 - Moderna risk 4-dose series) 07/07/2019  . FOOT EXAM  06/07/2020  . URINE MICROALBUMIN  06/07/2020  . TETANUS/TDAP  02/16/2021 (Originally 07/05/1979)  . HEMOGLOBIN A1C  08/16/2020  . INFLUENZA VACCINE  09/11/2020  . COLONOSCOPY (Pts 45-1yrs Insurance coverage will need to be confirmed)  12/12/2029  . HPV VACCINES  Aged Out  . Hepatitis C Screening  Discontinued  . HIV Screening  Discontinued    Health Maintenance  Health Maintenance Due  Topic Date Due  . PNEUMOCOCCAL POLYSACCHARIDE VACCINE AGE 40-64 HIGH RISK  Never done  . OPHTHALMOLOGY EXAM  Never done  . PAP SMEAR-Modifier  Never done  . COVID-19 Vaccine (3 - Moderna risk 4-dose series) 07/07/2019  . FOOT EXAM  06/07/2020  . URINE MICROALBUMIN  06/07/2020    Colorectal cancer screening: Type of screening: Colonoscopy. Completed 12/13/2019. Repeat every 10 years  Mammogram status: No longer required due to mastectomy.  Bone Density status: recommended as she has had chemo - she will discuss with Delton Coombes or Gottschalk  Lung Cancer Screening: (Low Dose CT Chest recommended if Age 41-80 years, 30 pack-year currently smoking OR have quit w/in 15years.) does not qualify.   Additional Screening:  Hepatitis C Screening: does qualify; needs this drawn  Vision Screening: Recommended annual ophthalmology exams for early detection of glaucoma and other disorders of the eye. Is the patient up to date with their annual eye exam?  No  Who is the provider or what is the name of the office in which the patient attends annual eye exams? n/a If pt is not established with a provider, would they like to be referred to a provider to establish care? No .   Dental Screening: Recommended annual dental exams for proper oral hygiene  Community Resource Referral / Chronic Care Management: CRR required this visit?  No   CCM required this visit?  No      Plan:     I have personally reviewed and noted  the following in the patient's chart:   . Medical and social history . Use of alcohol, tobacco or illicit drugs  . Current medications and supplements including opioid prescriptions.  . Functional ability and status . Nutritional status . Physical activity . Advanced directives . List of other physicians . Hospitalizations, surgeries, and ER visits in previous 12 months . Vitals . Screenings to include cognitive, depression, and falls . Referrals and appointments  In addition, I have reviewed and discussed with patient certain preventive protocols, quality metrics, and best practice recommendations. A written personalized care plan for preventive services as well as general preventive health recommendations were provided to patient.     Sandrea Hammond, LPN   [id number]   Nurse Notes: None

## 2020-06-28 NOTE — Telephone Encounter (Signed)
Pools: Please schedule for an appointment.  Need for antibiotics requires visit.

## 2020-06-28 NOTE — Telephone Encounter (Signed)
Appointment scheduled.

## 2020-06-28 NOTE — Telephone Encounter (Signed)
Would like a call back - had a cold over the weekend that has turned into a sinus infection, thich green/yellow mucus, pressure in sinues, low grade fever- had covid in January and knows it's not that; Also she is going to a concert with her daughters at the end of June and gets agoraphobia - wants to know if you could call in just one hydroxyzine or something similar so she doesn't have a panic attack

## 2020-06-28 NOTE — Patient Instructions (Signed)
Wendy Gilbert , Thank you for taking time to come for your Medicare Wellness Visit. I appreciate your ongoing commitment to your health goals. Please review the following plan we discussed and let me know if I can assist you in the future.   Screening recommendations/referrals: Colonoscopy: Done 12/13/2019 - Repeat in 10 years Mammogram: No longer required Bone Density: Due by age 62 Recommended yearly ophthalmology/optometry visit for glaucoma screening and checkup Recommended yearly dental visit for hygiene and checkup  Vaccinations: Influenza vaccine: Done 02/17/2020 - Repeat annually Pneumococcal vaccine: DUE Tdap vaccine: DUE Shingles vaccine: DUE  Covid-19: Done 05/07/2019 & 06/09/2019 - due for booster  Advanced directives: Please bring a copy of your health care power of attorney and living will to the office to be added to your chart at your convenience.  Conditions/risks identified: Aim for 30 minutes of exercise or brisk walking each day, drink 6-8 glasses of water and eat lots of fruits and vegetables.  Next appointment: Follow up in one year for your annual wellness visit.   Preventive Care 40-64 Years, Female Preventive care refers to lifestyle choices and visits with your health care provider that can promote health and wellness. What does preventive care include?  A yearly physical exam. This is also called an annual well check.  Dental exams once or twice a year.  Routine eye exams. Ask your health care provider how often you should have your eyes checked.  Personal lifestyle choices, including:  Daily care of your teeth and gums.  Regular physical activity.  Eating a healthy diet.  Avoiding tobacco and drug use.  Limiting alcohol use.  Practicing safe sex.  Taking low-dose aspirin daily starting at age 68.  Taking vitamin and mineral supplements as recommended by your health care provider. What happens during an annual well check? The services and  screenings done by your health care provider during your annual well check will depend on your age, overall health, lifestyle risk factors, and family history of disease. Counseling  Your health care provider may ask you questions about your:  Alcohol use.  Tobacco use.  Drug use.  Emotional well-being.  Home and relationship well-being.  Sexual activity.  Eating habits.  Work and work Statistician.  Method of birth control.  Menstrual cycle.  Pregnancy history. Screening  You may have the following tests or measurements:  Height, weight, and BMI.  Blood pressure.  Lipid and cholesterol levels. These may be checked every 5 years, or more frequently if you are over 43 years old.  Skin check.  Lung cancer screening. You may have this screening every year starting at age 37 if you have a 30-pack-year history of smoking and currently smoke or have quit within the past 15 years.  Fecal occult blood test (FOBT) of the stool. You may have this test every year starting at age 32.  Flexible sigmoidoscopy or colonoscopy. You may have a sigmoidoscopy every 5 years or a colonoscopy every 10 years starting at age 49.  Hepatitis C blood test.  Hepatitis B blood test.  Sexually transmitted disease (STD) testing.  Diabetes screening. This is done by checking your blood sugar (glucose) after you have not eaten for a while (fasting). You may have this done every 1-3 years.  Mammogram. This may be done every 1-2 years. Talk to your health care provider about when you should start having regular mammograms. This may depend on whether you have a family history of breast cancer.  BRCA-related cancer screening. This  may be done if you have a family history of breast, ovarian, tubal, or peritoneal cancers.  Pelvic exam and Pap test. This may be done every 3 years starting at age 76. Starting at age 80, this may be done every 5 years if you have a Pap test in combination with an HPV  test.  Bone density scan. This is done to screen for osteoporosis. You may have this scan if you are at high risk for osteoporosis. Discuss your test results, treatment options, and if necessary, the need for more tests with your health care provider. Vaccines  Your health care provider may recommend certain vaccines, such as:  Influenza vaccine. This is recommended every year.  Tetanus, diphtheria, and acellular pertussis (Tdap, Td) vaccine. You may need a Td booster every 10 years.  Zoster vaccine. You may need this after age 66.  Pneumococcal 13-valent conjugate (PCV13) vaccine. You may need this if you have certain conditions and were not previously vaccinated.  Pneumococcal polysaccharide (PPSV23) vaccine. You may need one or two doses if you smoke cigarettes or if you have certain conditions. Talk to your health care provider about which screenings and vaccines you need and how often you need them. This information is not intended to replace advice given to you by your health care provider. Make sure you discuss any questions you have with your health care provider. Document Released: 02/24/2015 Document Revised: 10/18/2015 Document Reviewed: 11/29/2014 Elsevier Interactive Patient Education  2017 [place] Prevention in the Home Falls can cause injuries. They can happen to people of all ages. There are many things you can do to make your home safe and to help prevent falls. What can I do on the outside of my home?  Regularly fix the edges of walkways and driveways and fix any cracks.  Remove anything that might make you trip as you walk through a door, such as a raised step or threshold.  Trim any bushes or trees on the path to your home.  Use bright outdoor lighting.  Clear any walking paths of anything that might make someone trip, such as rocks or tools.  Regularly check to see if handrails are loose or broken. Make sure that both sides of any steps have  handrails.  Any raised decks and porches should have guardrails on the edges.  Have any leaves, snow, or ice cleared regularly.  Use sand or salt on walking paths during winter.  Clean up any spills in your garage right away. This includes oil or grease spills. What can I do in the bathroom?  Use night lights.  Install grab bars by the toilet and in the tub and shower. Do not use towel bars as grab bars.  Use non-skid mats or decals in the tub or shower.  If you need to sit down in the shower, use a plastic, non-slip stool.  Keep the floor dry. Clean up any water that spills on the floor as soon as it happens.  Remove soap buildup in the tub or shower regularly.  Attach bath mats securely with double-sided non-slip rug tape.  Do not have throw rugs and other things on the floor that can make you trip. What can I do in the bedroom?  Use night lights.  Make sure that you have a light by your bed that is easy to reach.  Do not use any sheets or blankets that are too big for your bed. They should not hang down  onto the floor.  Have a firm chair that has side arms. You can use this for support while you get dressed.  Do not have throw rugs and other things on the floor that can make you trip. What can I do in the kitchen?  Clean up any spills right away.  Avoid walking on wet floors.  Keep items that you use a lot in easy-to-reach places.  If you need to reach something above you, use a strong step stool that has a grab bar.  Keep electrical cords out of the way.  Do not use floor polish or wax that makes floors slippery. If you must use wax, use non-skid floor wax.  Do not have throw rugs and other things on the floor that can make you trip. What can I do with my stairs?  Do not leave any items on the stairs.  Make sure that there are handrails on both sides of the stairs and use them. Fix handrails that are broken or loose. Make sure that handrails are as long as  the stairways.  Check any carpeting to make sure that it is firmly attached to the stairs. Fix any carpet that is loose or worn.  Avoid having throw rugs at the top or bottom of the stairs. If you do have throw rugs, attach them to the floor with carpet tape.  Make sure that you have a light switch at the top of the stairs and the bottom of the stairs. If you do not have them, ask someone to add them for you. What else can I do to help prevent falls?  Wear shoes that:  Do not have high heels.  Have rubber bottoms.  Are comfortable and fit you well.  Are closed at the toe. Do not wear sandals.  If you use a stepladder:  Make sure that it is fully opened. Do not climb a closed stepladder.  Make sure that both sides of the stepladder are locked into place.  Ask someone to hold it for you, if possible.  Clearly mark and make sure that you can see:  Any grab bars or handrails.  First and last steps.  Where the edge of each step is.  Use tools that help you move around (mobility aids) if they are needed. These include:  Canes.  Walkers.  Scooters.  Crutches.  Turn on the lights when you go into a dark area. Replace any light bulbs as soon as they burn out.  Set up your furniture so you have a clear path. Avoid moving your furniture around.  If any of your floors are uneven, fix them.  If there are any pets around you, be aware of where they are.  Review your medicines with your doctor. Some medicines can make you feel dizzy. This can increase your chance of falling. Ask your doctor what other things that you can do to help prevent falls. This information is not intended to replace advice given to you by your health care provider. Make sure you discuss any questions you have with your health care provider. Document Released: 11/24/2008 Document Revised: 07/06/2015 Document Reviewed: 03/04/2014 Elsevier Interactive Patient Education  2017 [employer].

## 2020-06-29 ENCOUNTER — Encounter: Payer: Self-pay | Admitting: Family Medicine

## 2020-06-29 ENCOUNTER — Ambulatory Visit (INDEPENDENT_AMBULATORY_CARE_PROVIDER_SITE_OTHER): Payer: Medicare Other | Admitting: Family Medicine

## 2020-06-29 DIAGNOSIS — J019 Acute sinusitis, unspecified: Secondary | ICD-10-CM | POA: Diagnosis not present

## 2020-06-29 MED ORDER — DOXYCYCLINE HYCLATE 100 MG PO TABS: 100.0000 mg | ORAL_TABLET | Freq: Two times a day (BID) | ORAL | 0 refills | Status: AC

## 2020-06-29 MED ORDER — PREDNISONE 10 MG (21) PO TBPK: ORAL_TABLET | ORAL | 0 refills | Status: DC

## 2020-06-29 NOTE — Progress Notes (Signed)
Virtual Visit via Telephone Note  I connected with Wendy Gilbert on 06/29/20 at 2:01 PM by telephone and verified that I am speaking with the correct person using two identifiers. Wendy Gilbert is currently located at home and her husband is currently with her during this visit. The provider, Loman Brooklyn, FNP is located in their office at time of visit.  I discussed the limitations, risks, security and privacy concerns of performing an evaluation and management service by telephone and the availability of in person appointments. I also discussed with the patient that there may be a patient responsible charge related to this service. The patient expressed understanding and agreed to proceed.  Subjective: PCP: Janora Norlander, DO  Chief Complaint  Patient presents with  . URI   Patient complains of cough, head congestion, sneezing, facial pain/pressure, fever and postnasal drainage. Onset of symptoms was 1 week ago, gradually worsening since that time. She is drinking plenty of fluids. Evaluation to date: none. Treatment to date: Tylenol. She does not smoke.    ROS: Per HPI  Current Outpatient Medications:  .  acetaminophen (TYLENOL) 650 MG CR tablet, Take 1,300 mg by mouth at bedtime., Disp: , Rfl:  .  Calcium Carb-Cholecalciferol (CALCIUM 1000 + D) 1000-800 MG-UNIT TABS, Take by mouth., Disp: , Rfl:  .  fluticasone (FLONASE) 50 MCG/ACT nasal spray, Place 1-2 sprays into both nostrils daily as needed (allergies.). As needed, Disp: , Rfl:  .  Magnesium 500 MG CAPS, Take 1,000 mg by mouth at bedtime. , Disp: , Rfl:  .  OneTouch Delica Lancets 60A MISC, Apply topically., Disp: , Rfl:  .  ONETOUCH VERIO test strip, 1 each daily., Disp: , Rfl:  .  pantoprazole (PROTONIX) 40 MG tablet, Take 1 tablet (40 mg total) by mouth daily., Disp: 90 tablet, Rfl: 3 .  triamterene-hydrochlorothiazide (MAXZIDE) 75-50 MG tablet, Take 1 tablet by mouth daily., Disp: 90 tablet, Rfl: 3  Allergies   Allergen Reactions  . Demerol Other (See Comments)    MENTAL STATUS CHANGE  . Erythromycin Nausea And Vomiting  . Adhesive [Tape] Rash  . Cephalexin Hives and Other (See Comments)    ABD. PAIN  . Penicillins Other (See Comments)    UNKNOWN - WAS AS A CHILD   Past Medical History:  Diagnosis Date  . Arthritis    "all over"  . Diabetes mellitus without complication ([place])   . DVT (deep venous thrombosis) ([place]) 10/2008   left arm  . GERD (gastroesophageal reflux disease)   . Headache(784.0)    sinus  . 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 PONV (postoperative nausea and vomiting)   . Seasonal allergies   . Sinusitis   . Vitamin D deficiency 04/13/2014    Observations/Objective: A&O  No respiratory distress or wheezing audible over the phone Mood, judgement, and thought processes all WNL  Assessment and Plan: 1. Acute non-recurrent sinusitis, unspecified location Discussed symptom management.  - doxycycline (VIBRA-TABS) 100 MG tablet; Take 1 tablet (100 mg total) by mouth 2 (two) times daily for 7 days.  Dispense: 14 tablet; Refill: 0 - predniSONE (STERAPRED UNI-PAK 21 TAB) 10 MG (21) TBPK tablet; As directed x 6 days  Dispense: 21 tablet; Refill: 0   Follow Up Instructions:  I discussed the assessment and treatment plan with the patient. The patient was provided an opportunity to ask questions and all were answered. The patient agreed with the plan and demonstrated an understanding of the  instructions.   The patient was advised to call back or seek an in-person evaluation if the symptoms worsen or if the condition fails to improve as anticipated.  The above assessment and management plan was discussed with the patient. The patient verbalized understanding of and has agreed to the management plan. Patient is aware to call the clinic if symptoms persist or worsen. Patient is aware when to return to the clinic for a follow-up visit. Patient educated on when it is  appropriate to go to the emergency department.   Time call ended: 2:06 PM  I provided 5 minutes of non-face-to-face time during this encounter.  Hendricks Limes, MSN, APRN, FNP-C [facility] 06/29/20

## 2020-08-16 ENCOUNTER — Ambulatory Visit: Payer: Medicare Other | Admitting: Family Medicine

## 2020-08-21 ENCOUNTER — Encounter: Payer: Self-pay | Admitting: Family Medicine

## 2020-08-21 ENCOUNTER — Other Ambulatory Visit: Payer: Self-pay

## 2020-08-21 ENCOUNTER — Ambulatory Visit (INDEPENDENT_AMBULATORY_CARE_PROVIDER_SITE_OTHER): Payer: Medicare Other | Admitting: Family Medicine

## 2020-08-21 VITALS — BP 129/89 | HR 117 | Temp 97.6°F | Ht 64.0 in | Wt 190.0 lb

## 2020-08-21 DIAGNOSIS — E1122 Type 2 diabetes mellitus with diabetic chronic kidney disease: Secondary | ICD-10-CM

## 2020-08-21 DIAGNOSIS — N183 Chronic kidney disease, stage 3 unspecified: Secondary | ICD-10-CM

## 2020-08-21 DIAGNOSIS — Z23 Encounter for immunization: Secondary | ICD-10-CM | POA: Diagnosis not present

## 2020-08-21 DIAGNOSIS — R0981 Nasal congestion: Secondary | ICD-10-CM

## 2020-08-21 DIAGNOSIS — I152 Hypertension secondary to endocrine disorders: Secondary | ICD-10-CM | POA: Diagnosis not present

## 2020-08-21 DIAGNOSIS — Z780 Asymptomatic menopausal state: Secondary | ICD-10-CM | POA: Diagnosis not present

## 2020-08-21 DIAGNOSIS — E876 Hypokalemia: Secondary | ICD-10-CM | POA: Diagnosis not present

## 2020-08-21 DIAGNOSIS — E1159 Type 2 diabetes mellitus with other circulatory complications: Secondary | ICD-10-CM

## 2020-08-21 DIAGNOSIS — M7501 Adhesive capsulitis of right shoulder: Secondary | ICD-10-CM

## 2020-08-21 LAB — BAYER DCA HB A1C WAIVED: HB A1C (BAYER DCA - WAIVED): 6.2 % (ref <7.0)

## 2020-08-21 MED ORDER — FLUTICASONE PROPIONATE 50 MCG/ACT NA SUSP: 1.0000 | Freq: Every day | NASAL | 99 refills | Status: DC | PRN
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
## 2020-08-21 NOTE — Patient Instructions (Signed)
You had labs performed today.  You will be contacted with the results of the labs once they are available, usually in the next 3 business days for routine lab work.  If you have an active my chart account, they will be released to your MyChart.  If you prefer to have these labs released to you via telephone, please let us know.  If you had a pap smear or biopsy performed, expect to be contacted in about 7-10 days.  Come in for DEXA/ bone density

## 2020-08-22 LAB — BASIC METABOLIC PANEL
BUN/Creatinine Ratio: 17 (ref 12–28)
BUN: 18 mg/dL (ref 8–27)
CO2: 24 mmol/L (ref 20–29)
Calcium: 9.9 mg/dL (ref 8.7–10.3)
Chloride: 98 mmol/L (ref 96–106)
Creatinine, Ser: 1.04 mg/dL — ABNORMAL HIGH (ref 0.57–1.00)
Glucose: 104 mg/dL — ABNORMAL HIGH (ref 65–99)
Potassium: 3.8 mmol/L (ref 3.5–5.2)
Sodium: 140 mmol/L (ref 134–144)
eGFR: 61 mL/min/{1.73_m2} (ref >59)

## 2020-08-29 ENCOUNTER — Other Ambulatory Visit: Payer: Medicare Other

## 2020-08-29 ENCOUNTER — Other Ambulatory Visit: Payer: Self-pay

## 2020-08-29 ENCOUNTER — Ambulatory Visit (INDEPENDENT_AMBULATORY_CARE_PROVIDER_SITE_OTHER): Payer: Medicare Other

## 2020-08-29 DIAGNOSIS — N183 Chronic kidney disease, stage 3 unspecified: Secondary | ICD-10-CM | POA: Diagnosis not present

## 2020-08-29 DIAGNOSIS — E1122 Type 2 diabetes mellitus with diabetic chronic kidney disease: Secondary | ICD-10-CM

## 2020-08-29 DIAGNOSIS — Z780 Asymptomatic menopausal state: Secondary | ICD-10-CM

## 2020-08-29 NOTE — Addendum Note (Signed)
Addended by: Janora Norlander on: 08/29/2020 11:19 AM   Modules accepted: Orders

## 2020-08-30 LAB — LIPID PANEL
Chol/HDL Ratio: 4.1 ratio (ref 0.0–4.4)
Cholesterol, Total: 204 mg/dL — ABNORMAL HIGH (ref 100–199)
HDL: 50 mg/dL (ref >39)
LDL Chol Calc (NIH): 128 mg/dL — ABNORMAL HIGH (ref 0–99)
Triglycerides: 145 mg/dL (ref 0–149)
VLDL Cholesterol Cal: 26 mg/dL (ref 5–40)

## 2020-08-30 LAB — MICROALBUMIN / CREATININE URINE RATIO
Creatinine, Urine: 112.1 mg/dL
Microalb/Creat Ratio: 6 mg/g creat (ref 0–29)
Microalbumin, Urine: 6.6 ug/mL

## 2020-09-04 DIAGNOSIS — Z780 Asymptomatic menopausal state: Secondary | ICD-10-CM | POA: Diagnosis not present

## 2020-09-12 DIAGNOSIS — M25511 Pain in right shoulder: Secondary | ICD-10-CM | POA: Diagnosis not present

## 2021-02-21 ENCOUNTER — Encounter: Payer: Self-pay | Admitting: Family Medicine

## 2021-02-21 ENCOUNTER — Ambulatory Visit (INDEPENDENT_AMBULATORY_CARE_PROVIDER_SITE_OTHER): Payer: Medicare Other | Admitting: Family Medicine

## 2021-02-21 VITALS — BP 139/85 | HR 84 | Temp 97.6°F | Ht 64.0 in | Wt 196.0 lb

## 2021-02-21 DIAGNOSIS — J3089 Other allergic rhinitis: Secondary | ICD-10-CM | POA: Diagnosis not present

## 2021-02-21 DIAGNOSIS — Z23 Encounter for immunization: Secondary | ICD-10-CM | POA: Diagnosis not present

## 2021-02-21 DIAGNOSIS — K219 Gastro-esophageal reflux disease without esophagitis: Secondary | ICD-10-CM

## 2021-02-21 DIAGNOSIS — E1122 Type 2 diabetes mellitus with diabetic chronic kidney disease: Secondary | ICD-10-CM

## 2021-02-21 DIAGNOSIS — I152 Hypertension secondary to endocrine disorders: Secondary | ICD-10-CM

## 2021-02-21 DIAGNOSIS — E785 Hyperlipidemia, unspecified: Secondary | ICD-10-CM

## 2021-02-21 DIAGNOSIS — E7800 Pure hypercholesterolemia, unspecified: Secondary | ICD-10-CM | POA: Diagnosis not present

## 2021-02-21 DIAGNOSIS — E1169 Type 2 diabetes mellitus with other specified complication: Secondary | ICD-10-CM | POA: Diagnosis not present

## 2021-02-21 DIAGNOSIS — R6889 Other general symptoms and signs: Secondary | ICD-10-CM | POA: Diagnosis not present

## 2021-02-21 DIAGNOSIS — R5382 Chronic fatigue, unspecified: Secondary | ICD-10-CM

## 2021-02-21 DIAGNOSIS — Z0000 Encounter for general adult medical examination without abnormal findings: Secondary | ICD-10-CM

## 2021-02-21 DIAGNOSIS — N182 Chronic kidney disease, stage 2 (mild): Secondary | ICD-10-CM | POA: Diagnosis not present

## 2021-02-21 DIAGNOSIS — Z0001 Encounter for general adult medical examination with abnormal findings: Secondary | ICD-10-CM | POA: Diagnosis not present

## 2021-02-21 DIAGNOSIS — E1159 Type 2 diabetes mellitus with other circulatory complications: Secondary | ICD-10-CM

## 2021-02-21 LAB — BAYER DCA HB A1C WAIVED: HB A1C (BAYER DCA - WAIVED): 6.1 % — ABNORMAL HIGH (ref 4.8–5.6)

## 2021-02-21 MED ORDER — TRIAMTERENE-HCTZ 75-50 MG PO TABS: 1.0000 | ORAL_TABLET | Freq: Every day | ORAL | 3 refills | Status: DC

## 2021-02-21 MED ORDER — PANTOPRAZOLE SODIUM 40 MG PO TBEC: 40.0000 mg | DELAYED_RELEASE_TABLET | Freq: Every day | ORAL | 3 refills | Status: DC

## 2021-02-21 MED ORDER — AZELASTINE-FLUTICASONE 137-50 MCG/ACT NA SUSP: 1.0000 | Freq: Two times a day (BID) | NASAL | 99 refills | Status: DC

## 2021-02-21 NOTE — Addendum Note (Signed)
Addended byCarrolyn Leigh on: 02/21/2021 01:16 PM   Modules accepted: Orders

## 2021-02-21 NOTE — Progress Notes (Signed)
Wendy Gilbert is a 61 y.o. female presents to office today for annual physical exam examination.    Concerns today include: 1. Type 2 Diabetes with hypertension, hyperlipidemia:  Patient has diet-controlled diabetes.  She does report some fatigability.  No reports of blood loss.  No reports of visual disturbance.  Last eye exam: DUE Last foot exam: UTD Last A1c:  Lab Results  Component Value Date   HGBA1C 6.2 08/21/2020   Nephropathy screen indicated?:  Up-to-date Last flu, zoster and/or pneumovax:  Immunization History  Administered Date(s) Administered   Influenza,inj,Quad PF,6+ Mos 11/27/2012, 02/17/2020   Influenza-Unspecified 12/16/2013, 12/28/2014   Moderna Sars-Covid-2 Vaccination 05/07/2019, 06/09/2019   Pneumococcal Conjugate-13 08/21/2020    ROS: No chest pain, shortness of breath, sensory changes.  2.  Nasal congestion Patient has difficulty breathing through her nose.  This seems to be very exacerbated since sustaining a trauma to her face.  She did see ENT shortly after that trauma and was told that things would eventually settle down but unfortunately she developed COVID-19 shortly after that fracture.  She wonders if perhaps something was displaced from the excessive nose blowing.  She has been utilizing Flonase but this does not seem to allow her to breathe normally through her nose.  She feels that she has an area of swelling as well noted at the bridge of her nose.  3.  GERD Patient reports that sometimes she has breakthrough GERD.  She will take an extra half tablet of her Protonix during those events and is asking for [place] to change her prescription today.  No reports of nausea, vomiting, GI bleeding or abdominal pain.  Diet: Fair, Exercise: No structured Last eye exam: needs Last dental exam: Has had evaluation and is currently awaiting treatment for teeth fracture sustained after fall Last colonoscopy: UTD Last mammogram: UTD Last pap smear: needs Refills  needed today: Protonix, Maxide Immunizations needed: Shingles, COVID, flu Immunization History  Administered Date(s) Administered   Influenza,inj,Quad PF,6+ Mos 11/27/2012, 02/17/2020   Influenza-Unspecified 12/16/2013, 12/28/2014   Moderna Sars-Covid-2 Vaccination 05/07/2019, 06/09/2019   Pneumococcal Conjugate-13 08/21/2020     Past Medical History:  Diagnosis Date   Arthritis    "all over"   Diabetes mellitus without complication ([place])    DVT (deep venous thrombosis) ([place]) 10/2008   left arm   GERD (gastroesophageal reflux disease)    Headache(784.0)    sinus   Inflammatory carcinoma of right breast dx'd 07/2008   PONV (postoperative nausea and vomiting)    Seasonal allergies    Sinusitis    Vitamin D deficiency 04/13/2014   Social History   Socioeconomic History   Marital status: Married    Spouse name: Network engineer   Number of children: 2   Years of education: 12   Highest education level: Some college, no degree  Occupational History   Occupation: disabled  Tobacco Use   Smoking status: Never   Smokeless tobacco: Never  Vaping Use   Vaping Use: Never used  Substance and Sexual Activity   Alcohol use: No   Drug use: No   Sexual activity: Not Currently  Other Topics Concern   Not on file  Social History Narrative   Not on file   Social Determinants of Health   Financial Resource Strain: Low Risk    Difficulty of Paying Living Expenses: Not hard at all  Food Insecurity: No Food Insecurity   Worried About Estate manager/land agent of Food in the Last Year: Never true  Ran Out of Food in the Last Year: Never true  Transportation Needs: No Transportation Needs   Lack of Transportation (Medical): No   Lack of Transportation (Non-Medical): No  Physical Activity: Insufficiently Active   Days of Exercise per Week: 7 days   Minutes of Exercise per Session: 10 min  Stress: No Stress Concern Present   Feeling of Stress : Not at all  Social Connections: Moderately Isolated    Frequency of Communication with Friends and Family: More than three times a week   Frequency of Social Gatherings with Friends and Family: More than three times a week   Attends Religious Services: Never   Marine scientist or Organizations: No   Attends Archivist Meetings: Never   Marital Status: Married  Human resources officer Violence: Not At Risk   Fear of Current or Ex-Partner: No   Emotionally Abused: No   Physically Abused: No   Sexually Abused: No   Past Surgical History:  Procedure Laterality Date   BIOPSY  12/13/2019   Procedure: BIOPSY;  Surgeon: [name], DO;  [facility];  Service: Endoscopy;;  gastric polyps   CESAREAN SECTION  09/1999   COLONOSCOPY WITH PROPOFOL N/A 12/13/2019   Procedure: COLONOSCOPY WITH PROPOFOL;  Surgeon: [name], DO;  [facility];  Service: Endoscopy;  Laterality: N/A;  12:30pm- patient will arrive as soon as she can.   DILATION AND CURETTAGE OF UTERUS  1978   ESOPHAGOGASTRODUODENOSCOPY (EGD) WITH PROPOFOL N/A 12/13/2019   Procedure: ESOPHAGOGASTRODUODENOSCOPY (EGD) WITH PROPOFOL;  Surgeon: [name], DO;  [facility];  Service: Endoscopy;  Laterality: N/A;   INCISION AND DRAINAGE ABSCESS  10/25/2008   and debridement left axillary abscess, abd. wall abscess, left thigh abscess   KNEE ARTHROSCOPY Left 11/1996   MASTECTOMY Bilateral 02/23/2009   right modified radical, left simple   MASTECTOMY  2011    bilateral    POLYPECTOMY  12/13/2019   Procedure: POLYPECTOMY;  Surgeon: [name], DO;  [facility]: [facility];  Service: Endoscopy;;   PORT-A-CATH REMOVAL Left 05/07/2012   Procedure: REMOVAL PORT-A-CATH;  Surgeon: Odis Hollingshead, MD;  Location: [facility];  Service: General;  Laterality: Left;   PORTACATH PLACEMENT  08/09/2008   TOTAL KNEE ARTHROPLASTY Left 08/10/2013   Procedure: LEFT TOTAL KNEE ARTHROPLASTY;  Surgeon: Tobi Bastos, MD;  Location: [facility];  Service: Orthopedics;  Laterality: Left;   TUBAL LIGATION     Family History  Problem Relation Age of Onset   Breast cancer Mother    Cancer Mother        breast, colon, ovarian   Heart disease Father    Cancer Paternal Uncle        pancreatic   Deep vein thrombosis Brother    Aortic aneurysm Brother    ADD / ADHD Daughter    Asthma Daughter    Diabetes Maternal Grandmother    Stroke Maternal Grandmother    Hypothyroidism Maternal Grandfather    Heart disease Maternal Grandfather    Anuerysm Paternal Grandmother    Heart attack Paternal Grandfather     Current Outpatient Medications:    acetaminophen (TYLENOL) 650 MG CR tablet, Take 1,300 mg by mouth at bedtime., Disp: , Rfl:    Calcium Carb-Cholecalciferol (CALCIUM 1000 + D) 1000-800 MG-UNIT TABS, Take by mouth., Disp: , Rfl:    fluticasone (FLONASE) 50 MCG/ACT nasal spray, Place 1-2 sprays into both nostrils daily  as needed (allergies.). As needed, Disp: 16 g, Rfl: PRN   Magnesium 500 MG CAPS, Take 1,000 mg by mouth at bedtime. , Disp: , Rfl:    OneTouch Delica Lancets 02O MISC, Apply topically., Disp: , Rfl:    ONETOUCH VERIO test strip, 1 each daily., Disp: , Rfl:    pantoprazole (PROTONIX) 40 MG tablet, Take 1 tablet (40 mg total) by mouth daily., Disp: 90 tablet, Rfl: 3   triamterene-hydrochlorothiazide (MAXZIDE) 75-50 MG tablet, Take 1 tablet by mouth daily., Disp: 90 tablet, Rfl: 3  Allergies  Allergen Reactions   Demerol Other (See Comments)    MENTAL STATUS CHANGE   Erythromycin Nausea And Vomiting   Adhesive [Tape] Rash   Cephalexin Hives and Other (See Comments)    ABD. PAIN   Penicillins Other (See Comments)    UNKNOWN - WAS AS A CHILD     ROS: Review of Systems Pertinent items noted in HPI and remainder of comprehensive ROS otherwise negative.    Physical exam BP 139/85    Pulse 84    Temp 97.6 F (36.4 C) (Temporal)    Ht 5' 4"  (1.626 m)    Wt 196 lb (88.9 kg)    BMI 33.64 kg/m  General  appearance: alert, cooperative, appears stated age, no distress, and morbidly obese Head: Normocephalic, without obvious abnormality, area of slight soft tissue swelling noted along the bridge of her nose Eyes: negative findings: lids and lashes normal, conjunctivae and sclerae normal, corneas clear, and pupils equal, round, reactive to light and accomodation Ears: normal TM's and external ear canals both ears Nose: deviated septum Throat:  She has an artificial bridge in place but has several fractured teeth anteriorly noted.  Oropharynx without masses or erythema Neck: no adenopathy, supple, symmetrical, trachea midline, and thyroid not enlarged, symmetric, no tenderness/mass/nodules Back: symmetric, no curvature. ROM normal. No CVA tenderness. Lungs: clear to auscultation bilaterally Heart: regular rate and rhythm, S1, S2 normal, no murmur, click, rub or gallop Abdomen: soft, non-tender; bowel sounds normal; no masses,  no organomegaly and obese Extremities: extremities normal, atraumatic, no cyanosis or edema Pulses: 2+ and symmetric Skin: Skin color, texture, turgor normal. No rashes or lesions Lymph nodes: Cervical, supraclavicular, and axillary nodes normal. Neurologic: Grossly normal Chest: Breasts are surgically absent   Assessment/ Plan: Kendra Opitz Crall here for annual physical exam.   Annual physical exam  Controlled type 2 diabetes mellitus with stage 2 chronic kidney disease, without long-term current use of insulin ([place]) - Plan: Bayer DCA Hb A1c Waived  Hypertension associated with diabetes ([place]) - Plan: CMP14+EGFR, triamterene-hydrochlorothiazide (MAXZIDE) 75-50 MG tablet  Hyperlipidemia associated with type 2 diabetes mellitus ([facility]) - Plan: Lipid Panel  Morbid obesity ([place]) - Plan: Bayer DCA Hb A1c Waived, Lipid Panel, CMP14+EGFR  Chronic fatigue - Plan: Vitamin B12, CBC, TSH, T4, Free  Gastroesophageal reflux disease without esophagitis - Plan: pantoprazole (PROTONIX)  40 MG tablet  Non-seasonal allergic rhinitis, unspecified trigger - Plan: Azelastine-Fluticasone 137-50 MCG/ACT SUSP  Due for Pap smear.  She will schedule this separately as she was not prepared to have that done today.  Influenza and tetanus shots were performed today  Check A1c.  Check renal function.  Needs a diabetic eye exam.  She is up-to-date on foot exam.  Blood pressure controlled.  No changes.  Refill sent  Check fasting lipid.  Should be on statin given diabetes diagnosis.  May need to consider GLP even if her blood sugar is well controlled to  promote weight loss in this patient.  For chronic fatigue we will check B12, CBC, TSH and free T4.  Though I suspect that her fatigue is likely a combination of physical deconditioning, morbid obesity and possibly postviral fatigue syndrome in the setting of COVID infection last year  For GERD, PPI has been increased for as needed extra tablet if needed.  Use only if needed.  Have advised her to follow-up with ENT with regards to her deviated septum and difficulty breathing out of her nose.  In the interim I am going to place her on a new nasal spray to help with both nasal congestion and rhinorrhea.  Counseled on healthy lifestyle choices, including diet (rich in fruits, vegetables and lean meats and low in salt and simple carbohydrates) and exercise (at least 30 minutes of moderate physical activity daily).  Esvin Hnat M. Lajuana Ripple, DO

## 2021-02-21 NOTE — Patient Instructions (Signed)
Get a checkup with Dr Benjamine Mola about your nose if the spray is not helping.  Schedule your pap smear.  Get diabetic eye exam done.  I changed your nasal spray to a combo spray that should help with congestion and drainage. This will REPLACE your Flonase.  You had labs performed today.  You will be contacted with the results of the labs once they are available, usually in the next 3 business days for routine lab work.  If you have an active my chart account, they will be released to your MyChart.  If you prefer to have these labs released to you via telephone, please let us know.  Preventive Care 51-68 Years Old, Female Preventive care refers to lifestyle choices and visits with your health care provider that can promote health and wellness. Preventive care visits are also called wellness exams. What can I expect for my preventive care visit? Counseling Your health care provider may ask you questions about your: Medical history, including: Past medical problems. Family medical history. Pregnancy history. Current health, including: Menstrual cycle. Method of birth control. Emotional well-being. Home life and relationship well-being. Sexual activity and sexual health. Lifestyle, including: Alcohol, nicotine or tobacco, and drug use. Access to firearms. Diet, exercise, and sleep habits. Work and work Statistician. Sunscreen use. Safety issues such as seatbelt and bike helmet use. Physical exam Your health care provider will check your: Height and weight. These may be used to calculate your BMI (body mass index). BMI is a measurement that tells if you are at a healthy weight. Waist circumference. This measures the distance around your waistline. This measurement also tells if you are at a healthy weight and may help predict your risk of certain diseases, such as type 2 diabetes and high blood pressure. Heart rate and blood pressure. Body temperature. Skin for abnormal spots. What  immunizations do I need? Vaccines are usually given at various ages, according to a schedule. Your health care provider will recommend vaccines for you based on your age, medical history, and lifestyle or other factors, such as travel or where you work. What tests do I need? Screening Your health care provider may recommend screening tests for certain conditions. This may include: Lipid and cholesterol levels. Diabetes screening. This is done by checking your blood sugar (glucose) after you have not eaten for a while (fasting). Pelvic exam and Pap test. Hepatitis B test. Hepatitis C test. HIV (human immunodeficiency virus) test. STI (sexually transmitted infection) testing, if you are at risk. Lung cancer screening. Colorectal cancer screening. Mammogram. Talk with your health care provider about when you should start having regular mammograms. This may depend on whether you have a family history of breast cancer. BRCA-related cancer screening. This may be done if you have a family history of breast, ovarian, tubal, or peritoneal cancers. Bone density scan. This is done to screen for osteoporosis. Talk with your health care provider about your test results, treatment options, and if necessary, the need for more tests. Follow these instructions at home: Eating and drinking  Eat a diet that includes fresh fruits and vegetables, whole grains, lean protein, and low-fat dairy products. Take vitamin and mineral supplements as recommended by your health care provider. Do not drink alcohol if: Your health care provider tells you not to drink. You are pregnant, may be pregnant, or are planning to become pregnant. If you drink alcohol: Limit how much you have to 0-1 drink a day. Know how much alcohol is in your drink.  In the [place] one drink equals one 12 oz bottle of beer (355 mL), one 5 oz glass of wine (148 mL), or one 1 oz glass of hard liquor (44 mL). Lifestyle Brush your teeth every morning  and night with fluoride toothpaste. Floss one time each day. Exercise for at least 30 minutes 5 or more days each week. Do not use any products that contain nicotine or tobacco. These products include cigarettes, chewing tobacco, and vaping devices, such as e-cigarettes. If you need help quitting, ask your health care provider. Do not use drugs. If you are sexually active, practice safe sex. Use a condom or other form of protection to prevent STIs. If you do not wish to become pregnant, use a form of birth control. If you plan to become pregnant, see your health care provider for a prepregnancy visit. Take aspirin only as told by your health care provider. Make sure that you understand how much to take and what form to take. Work with your health care provider to find out whether it is safe and beneficial for you to take aspirin daily. Find healthy ways to manage stress, such as: Meditation, yoga, or listening to music. Journaling. Talking to a trusted person. Spending time with friends and family. Minimize exposure to UV radiation to reduce your risk of skin cancer. Safety Always wear your seat belt while driving or riding in a vehicle. Do not drive: If you have been drinking alcohol. Do not ride with someone who has been drinking. When you are tired or distracted. While texting. If you have been using any mind-altering substances or drugs. Wear a helmet and other protective equipment during sports activities. If you have firearms in your house, make sure you follow all gun safety procedures. Seek help if you have been physically or sexually abused. What's next? Visit your health care provider once a year for an annual wellness visit. Ask your health care provider how often you should have your eyes and teeth checked. Stay up to date on all vaccines. This information is not intended to replace advice given to you by your health care provider. Make sure you discuss any questions you have  with your health care provider. Document Revised: 07/26/2020 Document Reviewed: 07/26/2020 Elsevier Patient Education  [place].

## 2021-02-22 LAB — CMP14+EGFR
ALT: 16 IU/L (ref 0–32)
AST: 18 IU/L (ref 0–40)
Albumin/Globulin Ratio: 1.5 (ref 1.2–2.2)
Albumin: 4.6 g/dL (ref 3.8–4.8)
Alkaline Phosphatase: 79 IU/L (ref 44–121)
BUN/Creatinine Ratio: 23 (ref 12–28)
BUN: 20 mg/dL (ref 8–27)
Bilirubin Total: 0.2 mg/dL (ref 0.0–1.2)
CO2: 19 mmol/L — ABNORMAL LOW (ref 20–29)
Calcium: 9.5 mg/dL (ref 8.7–10.3)
Chloride: 103 mmol/L (ref 96–106)
Creatinine, Ser: 0.87 mg/dL (ref 0.57–1.00)
Globulin, Total: 3 g/dL (ref 1.5–4.5)
Glucose: 111 mg/dL — ABNORMAL HIGH (ref 70–99)
Potassium: 4.7 mmol/L (ref 3.5–5.2)
Sodium: 142 mmol/L (ref 134–144)
Total Protein: 7.6 g/dL (ref 6.0–8.5)
eGFR: 75 mL/min/{1.73_m2} (ref >59)

## 2021-02-22 LAB — TSH: TSH: 2.6 u[IU]/mL (ref 0.450–4.500)

## 2021-02-22 LAB — LIPID PANEL
Chol/HDL Ratio: 4.3 ratio (ref 0.0–4.4)
Cholesterol, Total: 231 mg/dL — ABNORMAL HIGH (ref 100–199)
HDL: 54 mg/dL (ref >39)
LDL Chol Calc (NIH): 152 mg/dL — ABNORMAL HIGH (ref 0–99)
Triglycerides: 142 mg/dL (ref 0–149)
VLDL Cholesterol Cal: 25 mg/dL (ref 5–40)

## 2021-02-22 LAB — CBC
Hematocrit: 39.9 % (ref 34.0–46.6)
Hemoglobin: 12.9 g/dL (ref 11.1–15.9)
MCH: 27.3 pg (ref 26.6–33.0)
MCHC: 32.3 g/dL (ref 31.5–35.7)
MCV: 84 fL (ref 79–97)
Platelets: 340 10*3/uL (ref 150–450)
RBC: 4.73 x10E6/uL (ref 3.77–5.28)
RDW: 13.6 % (ref 11.7–15.4)
WBC: 5.7 10*3/uL (ref 3.4–10.8)

## 2021-02-22 LAB — VITAMIN B12: Vitamin B-12: 247 pg/mL (ref 232–1245)

## 2021-02-22 LAB — T4, FREE: Free T4: 1.26 ng/dL (ref 0.82–1.77)

## 2021-02-26 MED ORDER — ROSUVASTATIN CALCIUM 10 MG PO TABS: 10.0000 mg | ORAL_TABLET | Freq: Every day | ORAL | 3 refills | Status: DC

## 2021-02-26 NOTE — Addendum Note (Signed)
Addended by: Everlean Cherry on: 02/26/2021 12:28 PM   Modules accepted: Orders

## 2021-05-31 DIAGNOSIS — J310 Chronic rhinitis: Secondary | ICD-10-CM | POA: Diagnosis not present

## 2021-05-31 DIAGNOSIS — J343 Hypertrophy of nasal turbinates: Secondary | ICD-10-CM | POA: Diagnosis not present

## 2021-06-27 ENCOUNTER — Other Ambulatory Visit: Payer: Medicare Other

## 2021-06-27 DIAGNOSIS — E7800 Pure hypercholesterolemia, unspecified: Secondary | ICD-10-CM | POA: Diagnosis not present

## 2021-06-28 LAB — LIPID PANEL
Chol/HDL Ratio: 5 ratio — ABNORMAL HIGH (ref 0.0–4.4)
Cholesterol, Total: 246 mg/dL — ABNORMAL HIGH (ref 100–199)
HDL: 49 mg/dL (ref >39)
LDL Chol Calc (NIH): 157 mg/dL — ABNORMAL HIGH (ref 0–99)
Triglycerides: 216 mg/dL — ABNORMAL HIGH (ref 0–149)
VLDL Cholesterol Cal: 40 mg/dL (ref 5–40)

## 2021-06-28 LAB — HEPATIC FUNCTION PANEL
ALT: 15 IU/L (ref 0–32)
AST: 17 IU/L (ref 0–40)
Albumin: 4.7 g/dL (ref 3.8–4.8)
Alkaline Phosphatase: 79 IU/L (ref 44–121)
Bilirubin Total: 0.3 mg/dL (ref 0.0–1.2)
Bilirubin, Direct: <0.1 mg/dL (ref 0.00–0.40)
Total Protein: 7.9 g/dL (ref 6.0–8.5)

## 2021-06-29 ENCOUNTER — Ambulatory Visit: Payer: Medicare Other

## 2021-07-03 ENCOUNTER — Ambulatory Visit (INDEPENDENT_AMBULATORY_CARE_PROVIDER_SITE_OTHER): Payer: Medicare Other

## 2021-07-03 VITALS — Wt 190.0 lb

## 2021-07-03 DIAGNOSIS — Z0000 Encounter for general adult medical examination without abnormal findings: Secondary | ICD-10-CM

## 2021-07-03 NOTE — Patient Instructions (Signed)
Wendy Gilbert , Thank you for taking time to come for your Medicare Wellness Visit. I appreciate your ongoing commitment to your health goals. Please review the following plan we discussed and let me know if I can assist you in the future.   Screening recommendations/referrals: Colonoscopy: Done 12/13/2019 - Repeat in 7 years  Mammogram: No longer required due to mastectomy Bone Density: Done 09/04/2020 - Repeat in 2-5 years  Recommended yearly ophthalmology/optometry visit for glaucoma screening and checkup Recommended yearly dental visit for hygiene and checkup  Vaccinations: Influenza vaccine: Done 02/21/2021 - repeat in fall Pneumococcal vaccine: Prevnar-13 Done 08/21/2020 - get Pneumovax-23 next Tdap vaccine: Done 02/21/2021 - Repeat in 10 years Shingles vaccine: Due - Shingrix is 2 doses 2-6 months apart and over 90% effective    Covid-19: Done 05/07/2019 & 06/09/2019  Advanced directives: Advance directive discussed with you today. Even though you declined this today, please call our office should you change your mind, and we can give you the proper paperwork for you to fill out.   Conditions/risks identified: Aim for 30 minutes of exercise or brisk walking, 6-8 glasses of water, and 5 servings of fruits and vegetables each day.  Next appointment: Follow up in one year for your annual wellness visit.   Preventive Care 40-64 Years, Female Preventive care refers to lifestyle choices and visits with your health care provider that can promote health and wellness. What does preventive care include? A yearly physical exam. This is also called an annual well check. Dental exams once or twice a year. Routine eye exams. Ask your health care provider how often you should have your eyes checked. Personal lifestyle choices, including: Daily care of your teeth and gums. Regular physical activity. Eating a healthy diet. Avoiding tobacco and drug use. Limiting alcohol use. Practicing safe sex. Taking  low-dose aspirin daily starting at age 51. Taking vitamin and mineral supplements as recommended by your health care provider. What happens during an annual well check? The services and screenings done by your health care provider during your annual well check will depend on your age, overall health, lifestyle risk factors, and family history of disease. Counseling  Your health care provider may ask you questions about your: Alcohol use. Tobacco use. Drug use. Emotional well-being. Home and relationship well-being. Sexual activity. Eating habits. Work and work Statistician. Method of birth control. Menstrual cycle. Pregnancy history. Screening  You may have the following tests or measurements: Height, weight, and BMI. Blood pressure. Lipid and cholesterol levels. These may be checked every 5 years, or more frequently if you are over 32 years old. Skin check. Lung cancer screening. You may have this screening every year starting at age 33 if you have a 30-pack-year history of smoking and currently smoke or have quit within the past 15 years. Fecal occult blood test (FOBT) of the stool. You may have this test every year starting at age 71. Flexible sigmoidoscopy or colonoscopy. You may have a sigmoidoscopy every 5 years or a colonoscopy every 10 years starting at age 68. Hepatitis C blood test. Hepatitis B blood test. Sexually transmitted disease (STD) testing. Diabetes screening. This is done by checking your blood sugar (glucose) after you have not eaten for a while (fasting). You may have this done every 1-3 years. Mammogram. This may be done every 1-2 years. Talk to your health care provider about when you should start having regular mammograms. This may depend on whether you have a family history of breast cancer. BRCA-related  cancer screening. This may be done if you have a family history of breast, ovarian, tubal, or peritoneal cancers. Pelvic exam and Pap test. This may be done  every 3 years starting at age 47. Starting at age 59, this may be done every 5 years if you have a Pap test in combination with an HPV test. Bone density scan. This is done to screen for osteoporosis. You may have this scan if you are at high risk for osteoporosis. Discuss your test results, treatment options, and if necessary, the need for more tests with your health care provider. Vaccines  Your health care provider may recommend certain vaccines, such as: Influenza vaccine. This is recommended every year. Tetanus, diphtheria, and acellular pertussis (Tdap, Td) vaccine. You may need a Td booster every 10 years. Zoster vaccine. You may need this after age 45. Pneumococcal 13-valent conjugate (PCV13) vaccine. You may need this if you have certain conditions and were not previously vaccinated. Pneumococcal polysaccharide (PPSV23) vaccine. You may need one or two doses if you smoke cigarettes or if you have certain conditions. Talk to your health care provider about which screenings and vaccines you need and how often you need them. This information is not intended to replace advice given to you by your health care provider. Make sure you discuss any questions you have with your health care provider. Document Released: 02/24/2015 Document Revised: 10/18/2015 Document Reviewed: 11/29/2014 Elsevier Interactive Patient Education  2017 [place] Prevention in the Home Falls can cause injuries. They can happen to people of all ages. There are many things you can do to make your home safe and to help prevent falls. What can I do on the outside of my home? Regularly fix the edges of walkways and driveways and fix any cracks. Remove anything that might make you trip as you walk through a door, such as a raised step or threshold. Trim any bushes or trees on the path to your home. Use bright outdoor lighting. Clear any walking paths of anything that might make someone trip, such as rocks or  tools. Regularly check to see if handrails are loose or broken. Make sure that both sides of any steps have handrails. Any raised decks and porches should have guardrails on the edges. Have any leaves, snow, or ice cleared regularly. Use sand or salt on walking paths during winter. Clean up any spills in your garage right away. This includes oil or grease spills. What can I do in the bathroom? Use night lights. Install grab bars by the toilet and in the tub and shower. Do not use towel bars as grab bars. Use non-skid mats or decals in the tub or shower. If you need to sit down in the shower, use a plastic, non-slip stool. Keep the floor dry. Clean up any water that spills on the floor as soon as it happens. Remove soap buildup in the tub or shower regularly. Attach bath mats securely with double-sided non-slip rug tape. Do not have throw rugs and other things on the floor that can make you trip. What can I do in the bedroom? Use night lights. Make sure that you have a light by your bed that is easy to reach. Do not use any sheets or blankets that are too big for your bed. They should not hang down onto the floor. Have a firm chair that has side arms. You can use this for support while you get dressed. Do not have throw  rugs and other things on the floor that can make you trip. What can I do in the kitchen? Clean up any spills right away. Avoid walking on wet floors. Keep items that you use a lot in easy-to-reach places. If you need to reach something above you, use a strong step stool that has a grab bar. Keep electrical cords out of the way. Do not use floor polish or wax that makes floors slippery. If you must use wax, use non-skid floor wax. Do not have throw rugs and other things on the floor that can make you trip. What can I do with my stairs? Do not leave any items on the stairs. Make sure that there are handrails on both sides of the stairs and use them. Fix handrails that are  broken or loose. Make sure that handrails are as long as the stairways. Check any carpeting to make sure that it is firmly attached to the stairs. Fix any carpet that is loose or worn. Avoid having throw rugs at the top or bottom of the stairs. If you do have throw rugs, attach them to the floor with carpet tape. Make sure that you have a light switch at the top of the stairs and the bottom of the stairs. If you do not have them, ask someone to add them for you. What else can I do to help prevent falls? Wear shoes that: Do not have high heels. Have rubber bottoms. Are comfortable and fit you well. Are closed at the toe. Do not wear sandals. If you use a stepladder: Make sure that it is fully opened. Do not climb a closed stepladder. Make sure that both sides of the stepladder are locked into place. Ask someone to hold it for you, if possible. Clearly mark and make sure that you can see: Any grab bars or handrails. First and last steps. Where the edge of each step is. Use tools that help you move around (mobility aids) if they are needed. These include: Canes. Walkers. Scooters. Crutches. Turn on the lights when you go into a dark area. Replace any light bulbs as soon as they burn out. Set up your furniture so you have a clear path. Avoid moving your furniture around. If any of your floors are uneven, fix them. If there are any pets around you, be aware of where they are. Review your medicines with your doctor. Some medicines can make you feel dizzy. This can increase your chance of falling. Ask your doctor what other things that you can do to help prevent falls. This information is not intended to replace advice given to you by your health care provider. Make sure you discuss any questions you have with your health care provider. Document Released: 11/24/2008 Document Revised: 07/06/2015 Document Reviewed: 03/04/2014 Elsevier Interactive Patient Education  2017 [employer].

## 2021-07-03 NOTE — Progress Notes (Signed)
Subjective:   Wendy Gilbert is a 61 y.o. female who presents for Medicare Annual (Subsequent) preventive examination.  Virtual Visit via Telephone Note  I connected with  Wendy Gilbert on 07/03/21 at 12:00 PM EDT by telephone and verified that I am speaking with the correct person using two identifiers.  Location: Patient: Home Provider: [facility] Persons participating in the virtual visit: patient/Nurse Health Advisor   I discussed the limitations, risks, security and privacy concerns of performing an evaluation and management service by telephone and the availability of in person appointments. The patient expressed understanding and agreed to proceed.  Interactive audio and video telecommunications were attempted between this nurse and patient, however failed, due to patient having technical difficulties OR patient did not have access to video capability.  We continued and completed visit with audio only.  Some vital signs may be absent or patient reported.   Wendy Lad E Tait Balistreri, LPN   Review of Systems     Cardiac Risk Factors include: advanced age (>32mn, >>26women);obesity (BMI >30kg/m2);sedentary lifestyle;diabetes mellitus;dyslipidemia;hypertension     Objective:    Today's Vitals   07/03/21 1153  Weight: 190 lb (86.2 kg)   Body mass index is 32.61 kg/m.     07/03/2021   12:00 PM 06/28/2020    2:37 PM 06/26/2020    2:08 PM 12/13/2019    8:55 AM 06/28/2019    2:43 PM 06/24/2019    3:10 PM 06/19/2018    3:02 PM  Advanced Directives  Does Patient Have a Medical Advance Directive? No No No No No No No  Would patient like information on creating a medical advance directive? No - Patient declined No - Patient declined Yes (MAU/Ambulatory/Procedural Areas - Information given) No - Patient declined No - Patient declined Yes (MAU/Ambulatory/Procedural Areas - Information given) No - Patient declined    Current Medications (verified) Outpatient Encounter Medications as of 07/03/2021   Medication Sig   acetaminophen (TYLENOL) 650 MG CR tablet Take 1,300 mg by mouth at bedtime.   fluticasone (FLONASE) 50 MCG/ACT nasal spray    Magnesium 500 MG CAPS Take 1,000 mg by mouth at bedtime.    OneTouch Delica Lancets 372ZMISC Apply topically.   ONETOUCH VERIO test strip 1 each daily.   pantoprazole (PROTONIX) 40 MG tablet Take 1 tablet (40 mg total) by mouth daily. May take extra dose in PM if needed   triamterene-hydrochlorothiazide (MAXZIDE) 75-50 MG tablet Take 1 tablet by mouth daily.   rosuvastatin (CRESTOR) 10 MG tablet Take 1 tablet (10 mg total) by mouth daily. (Patient not taking: Reported on 07/03/2021)   [DISCONTINUED] Azelastine-Fluticasone 137-50 MCG/ACT SUSP Place 1 spray into the nose every 12 (twelve) hours. For nasal congestion/ drainage (Patient not taking: Reported on 07/03/2021)   No facility-administered encounter medications on file as of 07/03/2021.    Allergies (verified) Demerol, Erythromycin, Adhesive [tape], Cephalexin, and Penicillins   History: Past Medical History:  Diagnosis Date   Arthritis    "all over"   Di[place]    [place]   left arm   GERD (gastroesophageal reflux disease)    Headache(784.0)    sinus   Inflammatory carcinoma of right breast dx'd 07/2008   PONV (postoperative nausea and vomiting)    Seasonal allergies    Sinusitis    Vitamin D deficiency 04/13/2014   Past Surgical History:  Procedure Laterality Date   BIOPSY  12/13/2019   Procedure: BIOPSY;  Surgeon:  [name], DO;  [facility];  Service: Endoscopy;;  gastric polyps   CESAREAN SECTION  09/1999   COLONOSCOPY WITH PROPOFOL N/A 12/13/2019   Procedure: COLONOSCOPY WITH PROPOFOL;  Surgeon: [name], DO;  [facility];  Service: Endoscopy;  Laterality: N/A;  12:30pm- patient will arrive as soon as she can.   DILATION AND CURETTAGE OF UTERUS  1978    ESOPHAGOGASTRODUODENOSCOPY (EGD) WITH PROPOFOL N/A 12/13/2019   Procedure: ESOPHAGOGASTRODUODENOSCOPY (EGD) WITH PROPOFOL;  Surgeon: [name], DO;  [facility];  Service: Endoscopy;  Laterality: N/A;   INCISION AND DRAINAGE ABSCESS  10/25/2008   and debridement left axillary abscess, abd. wall abscess, left thigh abscess   KNEE ARTHROSCOPY Left 11/1996   MASTECTOMY Bilateral 02/23/2009   right modified radical, left simple   MASTECTOMY  2011    bilateral    POLYPECTOMY  12/13/2019   Procedure: POLYPECTOMY;  Surgeon: [name], DO;  [facility];  Service: Endoscopy;;   PORT-A-CATH REMOVAL Left 05/07/2012   Procedure: REMOVAL PORT-A-CATH;  Surgeon: Odis Hollingshead, MD;  Location: [facility];  Service: General;  Laterality: Left;   PORTACATH PLACEMENT  08/09/2008   TOTAL KNEE ARTHROPLASTY Left 08/10/2013   Procedure: LEFT TOTAL KNEE ARTHROPLASTY;  Surgeon: Tobi Bastos, MD;  [facility]: [facility];  Service: Orthopedics;  Laterality: Left;   TUBAL LIGATION     Family History  Problem Relation Age of Onset   Breast cancer Mother    Cancer Mother        breast, colon, ovarian   Heart disease Father    Cancer Paternal Uncle        pancreatic   Deep vein thrombosis Brother    Aortic aneurysm Brother    ADD / ADHD Daughter    Asthma Daughter    Diabetes Maternal Grandmother    Stroke Maternal Grandmother    Hypothyroidism Maternal Grandfather    Heart disease Maternal Grandfather    Anuerysm Paternal Grandmother    Heart attack Paternal Grandfather    Social History   Socioeconomic History   Marital status: Married    Spouse name: Ayesha Rumpf   Number of children: 2   Years of education: 12   Highest education level: Some college, no degree  Occupational History   Occupation: disabled  Tobacco Use   Smoking status: Never   Smokeless tobacco: Never  Vaping Use   Vaping Use: Never used  Substance and Sexual Activity   Alcohol use:  No   Drug use: No   Sexual activity: Not Currently  Other Topics Concern   Not on file  Social History Narrative   One daughter lives with them   Social Determinants of Health   Financial Resource Strain: Low Risk    Difficulty of Paying Living Expenses: Not hard at all  Food Insecurity: No Food Insecurity   Worried About Charity fundraiser in the Last Year: Never true   Arboriculturist in the Last Year: Never true  Transportation Needs: No Transportation Needs   Lack of Transportation (Medical): No   Lack of Transportation (Non-Medical): No  Physical Activity: Insufficiently Active   Days of Exercise per Week: 7 days   Minutes of Exercise per Session: 20 min  Stress: No Stress Concern Present   Feeling of Stress : Not at all  Social Connections: Moderately Isolated   Frequency of Communication with Friends and Family: More than  three times a week   Frequency of Social Gatherings with Friends and Family: More than three times a week   Attends Religious Services: Never   Marine scientist or Organizations: No   Attends Music therapist: Never   Marital Status: Married    Tobacco Counseling Counseling given: Not Answered   Clinical Intake:  Pre-visit preparation completed: Yes  Pain : No/denies pain     BMI - recorded: 32.61 Nutritional Status: BMI > 30  Obese Nutritional Risks: None Diabetes: Yes CBG done?: No Did pt. bring in CBG monitor from home?: No  How often do you need to have someone help you when you read instructions, pamphlets, or other written materials from your doctor or pharmacy?: 1 - Never  Diabetic? Nutrition Risk Assessment:  Has the patient had any N/V/D within the last 2 months?  No  Does the patient have any non-healing wounds?  No  Has the patient had any unintentional weight loss or weight gain?  No   Diabetes:  Is the patient diabetic?  Yes  If diabetic, was a CBG obtained today?  No  Did the patient bring in  their glucometer from home?  No  How often do you monitor your CBG's? Once daily fasting - 110 this am per patient.   Financial Strains and Diabetes Management:  Are you having any financial strains with the device, your supplies or your medication? No .  Does the patient want to be seen by Chronic Care Management for management of their diabetes?  No  Would the patient like to be referred to a Nutritionist or for Diabetic Management?  No   Diabetic Exams:  Diabetic Eye Exam:  Overdue for diabetic eye exam. Pt has been advised about the importance in completing this exam. Declined referral - says she will make appt soon.  Diabetic Foot Exam: Completed 08/21/2020. Pt has been advised about the importance in completing this exam. Pt is scheduled for diabetic foot exam on next appt with PCP.    Interpreter Needed?: No  Information entered by :: Charbel Los, LPN   Activities of Daily Living    07/03/2021   12:00 PM  In your present state of health, do you have any difficulty performing the following activities:  Hearing? 0  Vision? 0  Difficulty concentrating or making decisions? 0  Walking or climbing stairs? 1  Comment hurts knees - holds rails  Dressing or bathing? 0  Doing errands, shopping? 0  Preparing Food and eating ? N  Using the Toilet? N  In the past six months, have you accidently leaked urine? N  Do you have problems with loss of bowel control? N  Managing your Medications? N  Managing your Finances? N  Housekeeping or managing your Housekeeping? N    Patient Care Team: Janora Norlander, DO as PCP - General (Family Medicine) Tyler Pita, MD as Consulting Physician (Radiation Oncology) Derek Jack, MD as Consulting Physician (Hematology) [name], DO as Consulting Physician (Gastroenterology) Liana Gerold, MD as Consulting Physician (Nephrology)  Indicate any recent Medical Services you may have received from other than [facility]  providers in the past year (date may be approximate).     Assessment:   This is a routine wellness examination for Wendy Gilbert.  Hearing/Vision screen Hearing Screening - Comments:: Denies hearing difficulties   Vision Screening - Comments:: Wears reading glasses prn only - no optometrist at this time - she plans to get appt soon.  Dietary issues and exercise activities discussed: Current Exercise Habits: Home exercise routine, Type of exercise: walking, Time (Minutes): 20, Frequency (Times/Week): 7, Weekly Exercise (Minutes/Week): 140, Intensity: Mild, Exercise limited by: orthopedic condition(s)   Goals Addressed             This Visit's Progress    Patient Stated   On track    07/03/2021 AWV Goal: Keep All Scheduled Appointments  Over the next year, patient will attend all scheduled appointments with their PCP and any specialists that they see.        Depression Screen    07/03/2021   11:58 AM 08/21/2020    3:00 PM 06/28/2020    2:41 PM 02/17/2020    3:13 PM 02/03/2020   10:28 AM 08/12/2019    1:58 PM 06/28/2019    2:44 PM  PHQ 2/9 Scores  PHQ - 2 Score 0 0 0 0 0 4 0  PHQ- 9 Score  5    6     Fall Risk    07/03/2021   11:56 AM 08/21/2020    3:00 PM 06/28/2020    2:35 PM 02/17/2020    3:13 PM 08/12/2019    1:58 PM  Fall Risk   Falls in the past year? 0 '1 1 1 '$ 0  Number falls in past yr: 0 0 1 0 0  Injury with Fall? 0 '1 1 1 '$ 0  Risk for fall due to : Orthopedic patient  History of fall(s);Orthopedic patient;Medication side effect History of fall(s)   Follow up Falls prevention discussed  Falls prevention discussed;Education provided Falls evaluation completed     FALL RISK PREVENTION PERTAINING TO THE HOME:  Any stairs in or around the home? Yes  If so, are there any without handrails? No  Home free of loose throw rugs in walkways, pet beds, electrical cords, etc? Yes  Adequate lighting in your home to reduce risk of falls? Yes   ASSISTIVE DEVICES UTILIZED TO PREVENT  FALLS:  Life alert? No  Use of a cane, walker or w/c? No  Grab bars in the bathroom? Yes  Shower chair or bench in shower? Yes  Elevated toilet seat or a handicapped toilet? No   TIMED UP AND GO:  Was the test performed? No . Telephonic visit  Cognitive Function:        07/03/2021   12:01 PM 06/28/2020    2:38 PM 06/28/2019    2:46 PM  6CIT Screen  What Year? 0 points  0 points  What month? 0 points  0 points  What time? 0 points 0 points 0 points  Count back from 20 0 points 0 points 0 points  Months in reverse 0 points 0 points 0 points  Repeat phrase 0 points 0 points 0 points  Total Score 0 points  0 points    Immunizations Immunization History  Administered Date(s) Administered   Influenza,inj,Quad PF,6+ Mos 11/27/2012, 02/17/2020, 02/21/2021   Influenza-Unspecified 12/16/2013, 12/28/2014   Moderna Sars-Covid-2 Vaccination 05/07/2019, 06/09/2019   Pneumococcal Conjugate-13 08/21/2020   Tdap 02/21/2021    TDAP status: Up to date  Flu Vaccine status: Up to date  Pneumococcal vaccine status: Up to date  Covid-19 vaccine status: Completed vaccines  Qualifies for Shingles Vaccine? Yes   Zostavax completed No   Shingrix Completed?: No.    Education has been provided regarding the importance of this vaccine. Patient has been advised to call insurance company to determine out of pocket expense if they have not  yet received this vaccine. Advised may also receive vaccine at local pharmacy or Health Dept. Verbalized acceptance and understanding.  Screening Tests Health Maintenance  Topic Date Due   OPHTHALMOLOGY EXAM  Never done   Zoster Vaccines- Shingrix (1 of 2) Never done   PAP SMEAR-Modifier  Never done   COVID-19 Vaccine (3 - Moderna risk series) 07/07/2019   URINE MICROALBUMIN  08/29/2021   FOOT EXAM  08/21/2021   HEMOGLOBIN A1C  08/21/2021   INFLUENZA VACCINE  09/11/2021   DEXA SCAN  09/04/2025   COLONOSCOPY (Pts 45-31yr Insurance coverage will need to  be confirmed)  12/13/2026   TETANUS/TDAP  02/22/2031   HPV VACCINES  Aged Out   Hepatitis C Screening  Discontinued   HIV Screening  Discontinued    Health Maintenance  Health Maintenance Due  Topic Date Due   OPHTHALMOLOGY EXAM  Never done   Zoster Vaccines- Shingrix (1 of 2) Never done   PAP SMEAR-Modifier  Never done   COVID-19 Vaccine (3 - Moderna risk series) 07/07/2019   URINE MICROALBUMIN  08/29/2021    Colorectal cancer screening: Type of screening: Colonoscopy. Completed 12/13/2019. Repeat every 7 years  Mammogram status: No longer required due to mastectomy.  Bone Density status: Completed 09/04/2020. Results reflect: Bone density results: NORMAL. Repeat every 2-5 years.  Lung Cancer Screening: (Low Dose CT Chest recommended if Age 63-80years, 30 pack-year currently smoking OR have quit w/in 15years.) does not qualify.    Additional Screening:  Hepatitis C Screening: does qualify; declined  Vision Screening: Recommended annual ophthalmology exams for early detection of glaucoma and other disorders of the eye. Is the patient up to date with their annual eye exam?  No  Who is the provider or what is the name of the office in which the patient attends annual eye exams? none If pt is not established with a provider, would they like to be referred to a provider to establish care? No .   Dental Screening: Recommended annual dental exams for proper oral hygiene  Community Resource Referral / Chronic Care Management: CRR required this visit?  No   CCM required this visit?  No      Plan:     I have personally reviewed and noted the following in the patient's chart:   Medical and social history Use of alcohol, tobacco or illicit drugs  Current medications and supplements including opioid prescriptions.  Functional ability and status Nutritional status Physical activity Advanced directives List of other physicians Hospitalizations, surgeries, and ER visits in  previous 12 months Vitals Screenings to include cognitive, depression, and falls Referrals and appointments  In addition, I have reviewed and discussed with patient certain preventive protocols, quality metrics, and best practice recommendations. A written personalized care plan for preventive services as well as general preventive health recommendations were provided to patient.     ASandrea Hammond LPN   [id number]  Nurse Notes: None

## 2021-08-22 ENCOUNTER — Encounter: Payer: Self-pay | Admitting: Family Medicine

## 2021-08-22 ENCOUNTER — Ambulatory Visit (INDEPENDENT_AMBULATORY_CARE_PROVIDER_SITE_OTHER): Payer: Medicare Other | Admitting: Family Medicine

## 2021-08-22 VITALS — BP 139/79 | HR 93 | Temp 98.1°F | Ht 64.0 in | Wt 191.4 lb

## 2021-08-22 DIAGNOSIS — L309 Dermatitis, unspecified: Secondary | ICD-10-CM

## 2021-08-22 DIAGNOSIS — E1122 Type 2 diabetes mellitus with diabetic chronic kidney disease: Secondary | ICD-10-CM | POA: Diagnosis not present

## 2021-08-22 DIAGNOSIS — Z23 Encounter for immunization: Secondary | ICD-10-CM

## 2021-08-22 DIAGNOSIS — E1159 Type 2 diabetes mellitus with other circulatory complications: Secondary | ICD-10-CM

## 2021-08-22 DIAGNOSIS — I152 Hypertension secondary to endocrine disorders: Secondary | ICD-10-CM

## 2021-08-22 DIAGNOSIS — N182 Chronic kidney disease, stage 2 (mild): Secondary | ICD-10-CM

## 2021-08-22 DIAGNOSIS — E785 Hyperlipidemia, unspecified: Secondary | ICD-10-CM | POA: Diagnosis not present

## 2021-08-22 DIAGNOSIS — E1169 Type 2 diabetes mellitus with other specified complication: Secondary | ICD-10-CM | POA: Diagnosis not present

## 2021-08-22 LAB — BAYER DCA HB A1C WAIVED: HB A1C (BAYER DCA - WAIVED): 5.9 % — ABNORMAL HIGH (ref 4.8–5.6)

## 2021-08-22 MED ORDER — PRAVASTATIN SODIUM 40 MG PO TABS: 40.0000 mg | ORAL_TABLET | Freq: Every day | ORAL | 3 refills | Status: DC

## 2021-08-22 MED ORDER — CLOTRIMAZOLE-BETAMETHASONE 1-0.05 % EX CREA: 1.0000 | TOPICAL_CREAM | Freq: Two times a day (BID) | CUTANEOUS | 0 refills | Status: DC | PRN

## 2021-08-22 NOTE — Progress Notes (Signed)
Subjective: CC: Diabetes PCP: Janora Norlander, DO JQB:HALPF C Meyn is a 61 y.o. female presenting to clinic today for:  1. Type 2 Diabetes with hypertension, hyperlipidemia:  Patient has had diet-controlled diabetes.  She really has been trying to watch her intake to improve fatty liver changes and cholesterol.  She was not able to tolerate the Crestor due to polyarthralgia and polymyalgia.  She has subsequently discontinued the medication after only a few weeks of taking.  Last eye exam: Needs Last foot exam: Needs Last A1c:  Lab Results  Component Value Date   HGBA1C 5.9 (H) 08/22/2021   Nephropathy screen indicated?:  Due soon Last flu, zoster and/or pneumovax:  Immunization History  Administered Date(s) Administered   Influenza,inj,Quad PF,6+ Mos 11/27/2012, 02/17/2020, 02/21/2021   Influenza-Unspecified 12/16/2013, 12/28/2014   Moderna Sars-Covid-2 Vaccination 05/07/2019, 06/09/2019   Pneumococcal Conjugate-13 08/21/2020   Tdap 02/21/2021   Zoster Recombinat (Shingrix) 08/22/2021    ROS: No chest pain, shortness of breath, neuropathy reported  2.  Rash Patient reports a slightly itchy rash that is been present for quite some time on the right side of her chest near her mastectomy site.  It started out with 1 lesion but now 2 lesions are there.   ROS: Per HPI  Allergies  Allergen Reactions   Demerol Other (See Comments)    MENTAL STATUS CHANGE   Erythromycin Nausea And Vomiting   Adhesive [Tape] Rash   Cephalexin Hives and Other (See Comments)    ABD. PAIN   Penicillins Other (See Comments)    UNKNOWN - WAS AS A CHILD   Past Medical History:  Diagnosis Date   Arthritis    "all over"   Diabetes mellitus without complication ([place])    DVT (deep venous thrombosis) ([place]) 10/2008   left arm   GERD (gastroesophageal reflux disease)    Headache(784.0)    sinus   Inflammatory carcinoma of right breast dx'd 07/2008   PONV (postoperative nausea and vomiting)     Seasonal allergies    Sinusitis    Vitamin D deficiency 04/13/2014    Current Outpatient Medications:    acetaminophen (TYLENOL) 650 MG CR tablet, Take 1,300 mg by mouth at bedtime., Disp: , Rfl:    fluticasone (FLONASE) 50 MCG/ACT nasal spray, , Disp: , Rfl:    Magnesium 500 MG CAPS, Take 1,000 mg by mouth at bedtime. , Disp: , Rfl:    OneTouch Delica Lancets 79K MISC, Apply topically., Disp: , Rfl:    ONETOUCH VERIO test strip, 1 each daily., Disp: , Rfl:    pantoprazole (PROTONIX) 40 MG tablet, Take 1 tablet (40 mg total) by mouth daily. May take extra dose in PM if needed, Disp: 135 tablet, Rfl: 3   rosuvastatin (CRESTOR) 10 MG tablet, Take 1 tablet (10 mg total) by mouth daily. (Patient not taking: Reported on 07/03/2021), Disp: 90 tablet, Rfl: 3   triamterene-hydrochlorothiazide (MAXZIDE) 75-50 MG tablet, Take 1 tablet by mouth daily., Disp: 90 tablet, Rfl: 3 Social History   Socioeconomic History   Marital status: Married    Spouse name: Ayesha Rumpf   Number of children: 2   Years of education: 12   Highest education level: Some college, no degree  Occupational History   Occupation: disabled  Tobacco Use   Smoking status: Never   Smokeless tobacco: Never  Vaping Use   Vaping Use: Never used  Substance and Sexual Activity   Alcohol use: No   Drug use: No   Sexual  activity: Not Currently  Other Topics Concern   Not on file  Social History Narrative   One daughter lives with them   Social Determinants of Health   Financial Resource Strain: Low Risk  (07/03/2021)   Overall Financial Resource Strain (CARDIA)    Difficulty of Paying Living Expenses: Not hard at all  Food Insecurity: No Food Insecurity (07/03/2021)   Hunger Vital Sign    Worried About Running Out of Food in the Last Year: Never true    Ran Out of Food in the Last Year: Never true  Transportation Needs: No Transportation Needs (07/03/2021)   PRAPARE - Hydrologist (Medical): No     Lack of Transportation (Non-Medical): No  Physical Activity: Insufficiently Active (07/03/2021)   Exercise Vital Sign    Days of Exercise per Week: 7 days    Minutes of Exercise per Session: 20 min  Stress: No Stress Concern Present (07/03/2021)   [place]    Feeling of Stress : Not at all  Social Connections: Moderately Isolated (07/03/2021)   Social Connection and Isolation Panel [NHANES]    Frequency of Communication with Friends and Family: More than three times a week    Frequency of Social Gatherings with Friends and Family: More than three times a week    Attends Religious Services: Never    Marine scientist or Organizations: No    Attends Archivist Meetings: Never    Marital Status: Married  Human resources officer Violence: Not At Risk (07/03/2021)   Humiliation, Afraid, Rape, and Kick questionnaire    Fear of Current or Ex-Partner: No    Emotionally Abused: No    Physically Abused: No    Sexually Abused: No   Family History  Problem Relation Age of Onset   Breast cancer Mother    Cancer Mother        breast, colon, ovarian   Heart disease Father    Cancer Paternal Uncle        pancreatic   Deep vein thrombosis Brother    Aortic aneurysm Brother    ADD / ADHD Daughter    Asthma Daughter    Diabetes Maternal Grandmother    Stroke Maternal Grandmother    Hypothyroidism Maternal Grandfather    Heart disease Maternal Grandfather    Anuerysm Paternal Grandmother    Heart attack Paternal Grandfather     Objective: Office vital signs reviewed. BP 139/79   Pulse 93   Temp 98.1 F (36.7 C)   Ht '5\' 4"'$  (1.626 m)   Wt 191 lb 6.4 oz (86.8 kg)   SpO2 94%   BMI 32.85 kg/m   Physical Examination:  General: Awake, alert, obese, No acute distress HEENT: Missing front teeth on the right.  Oropharynx without erythema. Cardio: regular rate and rhythm, S1S2 heard, no murmurs appreciated Pulm: clear  to auscultation bilaterally, no wheezes, rhonchi or rales; normal work of breathing on room air Skin: Annular with slightly raised border and slight erythematous lesions the size of a pencil eraser along the right/center of her chest near her mastectomy site.  There is slight scaling associated but no exudates or vesicular formation  The 10-year ASCVD risk score (Arnett DK, et al., 2019) is: 15.9%   Values used to calculate the score:     Age: 30 years     Sex: Female     Is Non-Hispanic African American: No  Diabetic: Yes     Tobacco smoker: No     Systolic Blood Pressure: 423 mmHg     Is BP treated: Yes     HDL Cholesterol: 49 mg/dL     Total Cholesterol: 246 mg/dL  Assessment/ Plan: 61 y.o. female   Controlled type 2 diabetes mellitus with stage 2 chronic kidney disease, without long-term current use of insulin (HCC) - Plan: Bayer DCA Hb A1c Waived  Hypertension associated with diabetes ([place])  Hyperlipidemia associated with type 2 diabetes mellitus ([place]) - Plan: pravastatin (PRAVACHOL) 40 MG tablet  Morbid obesity ([place])  Dermatitis - Plan: clotrimazole-betamethasone (LOTRISONE) cream  Diabetes remains under excellent control.  Blood pressure well controlled.  Pravachol to replace Crestor.  She will contact me if she cannot tolerate this  We will treat dermatitis with clotrimazole desquamative Methasone cream.  If no resolution, recommend reevaluation  Orders Placed This Encounter  Procedures   Bayer DCA Hb A1c Waived   No orders of the defined types were placed in this encounter.    Janora Norlander, DO [place] [phone]

## 2021-09-06 DIAGNOSIS — R0981 Nasal congestion: Secondary | ICD-10-CM | POA: Diagnosis not present

## 2021-09-06 DIAGNOSIS — J343 Hypertrophy of nasal turbinates: Secondary | ICD-10-CM | POA: Insufficient documentation

## 2021-10-18 NOTE — Progress Notes (Signed)
[facility] Quality Team Note  Name: Wendy Gilbert Date of Birth: 06-16-1960 MRN: [id number] Date: 10/18/2021  [facility] Quality Team has reviewed this patient's chart, please see recommendations below:  Diabetic Retinal Eye Exam; Patient requests [facility] Quality Coordinator to schedule Diabetic Retinal Screening at ([place] Event 10/25/21 3:00p).

## 2021-10-25 LAB — HM DIABETES EYE EXAM

## 2021-11-21 ENCOUNTER — Other Ambulatory Visit: Payer: Self-pay | Admitting: Family Medicine

## 2021-11-21 DIAGNOSIS — K219 Gastro-esophageal reflux disease without esophagitis: Secondary | ICD-10-CM

## 2021-11-21 DIAGNOSIS — I152 Hypertension secondary to endocrine disorders: Secondary | ICD-10-CM

## 2021-11-23 ENCOUNTER — Ambulatory Visit (INDEPENDENT_AMBULATORY_CARE_PROVIDER_SITE_OTHER): Payer: Medicare Other | Admitting: Family Medicine

## 2021-11-23 ENCOUNTER — Encounter: Payer: Self-pay | Admitting: Family Medicine

## 2021-11-23 VITALS — BP 131/86 | HR 104 | Temp 97.3°F | Ht 64.0 in | Wt 185.6 lb

## 2021-11-23 DIAGNOSIS — N182 Chronic kidney disease, stage 2 (mild): Secondary | ICD-10-CM

## 2021-11-23 DIAGNOSIS — Z23 Encounter for immunization: Secondary | ICD-10-CM | POA: Diagnosis not present

## 2021-11-23 DIAGNOSIS — E785 Hyperlipidemia, unspecified: Secondary | ICD-10-CM | POA: Diagnosis not present

## 2021-11-23 DIAGNOSIS — R5383 Other fatigue: Secondary | ICD-10-CM | POA: Diagnosis not present

## 2021-11-23 DIAGNOSIS — E559 Vitamin D deficiency, unspecified: Secondary | ICD-10-CM | POA: Diagnosis not present

## 2021-11-23 DIAGNOSIS — I152 Hypertension secondary to endocrine disorders: Secondary | ICD-10-CM | POA: Diagnosis not present

## 2021-11-23 DIAGNOSIS — E1169 Type 2 diabetes mellitus with other specified complication: Secondary | ICD-10-CM | POA: Diagnosis not present

## 2021-11-23 DIAGNOSIS — K219 Gastro-esophageal reflux disease without esophagitis: Secondary | ICD-10-CM | POA: Diagnosis not present

## 2021-11-23 DIAGNOSIS — E1159 Type 2 diabetes mellitus with other circulatory complications: Secondary | ICD-10-CM | POA: Diagnosis not present

## 2021-11-23 DIAGNOSIS — E1122 Type 2 diabetes mellitus with diabetic chronic kidney disease: Secondary | ICD-10-CM | POA: Diagnosis not present

## 2021-11-23 LAB — BAYER DCA HB A1C WAIVED: HB A1C (BAYER DCA - WAIVED): 6.5 % — ABNORMAL HIGH (ref 4.8–5.6)

## 2021-11-23 MED ORDER — PANTOPRAZOLE SODIUM 40 MG PO TBEC: DELAYED_RELEASE_TABLET | ORAL | 3 refills | Status: DC

## 2021-11-23 MED ORDER — TRIAMTERENE-HCTZ 75-50 MG PO TABS: 1.0000 | ORAL_TABLET | Freq: Every day | ORAL | 3 refills | Status: DC

## 2021-11-23 NOTE — Progress Notes (Signed)
Subjective: CC:DM PCP: Wendy Gilbert, Wendy Gilbert [id number] Wendy Gilbert is a 61 y.o. female presenting to clinic today for:  1. Type 2 Diabetes with hypertension, hyperlipidemia:  Diabetes has been diet controlled.  She is compliant with Pravachol and tolerating this without difficulty.  No myalgia, chest pain reported.  Nonfasting.  No neuropathy reported but having difficulty trimming her own nails due to bilateral shoulder issues that are requiring replacement and obesity.  Last eye exam: UTD Last foot exam: needs Last A1c:  Lab Results  Component Value Date   HGBA1C 6.5 (H) 11/23/2021   Nephropathy screen indicated?: needs in 2 weeks Last flu, zoster and/or pneumovax:  Immunization History  Administered Date(s) Administered   Influenza,inj,Quad PF,6+ Mos 11/27/2012, 02/17/2020, 02/21/2021, 11/23/2021   Influenza-Unspecified 12/16/2013, 12/28/2014   Moderna Sars-Covid-2 Vaccination 05/07/2019, 06/09/2019   Pneumococcal Conjugate-13 08/21/2020   Tdap 02/21/2021   Zoster Recombinat (Shingrix) 08/22/2021      ROS: Per HPI  Allergies  Allergen Reactions   Demerol Other (See Comments)    MENTAL STATUS CHANGE   Erythromycin Nausea And Vomiting   Adhesive [Tape] Rash   Cephalexin Hives and Other (See Comments)    ABD. PAIN   Penicillins Other (See Comments)    UNKNOWN - WAS AS A CHILD   Past Medical History:  Diagnosis Date   Arthritis    "all over"   Diabetes mellitus without complication ([place])    DVT (deep venous thrombosis) ([place]) 10/2008   left arm   GERD (gastroesophageal reflux disease)    Headache(784.0)    sinus   Inflammatory carcinoma of right breast dx'd 07/2008   PONV (postoperative nausea and vomiting)    Seasonal allergies    Sinusitis    Vitamin D deficiency 04/13/2014    Current Outpatient Medications:    acetaminophen (TYLENOL) 650 MG CR tablet, Take 1,300 mg by mouth at bedtime., Disp: , Rfl:    clotrimazole-betamethasone (LOTRISONE) cream, Apply 1  Application topically 2 (two) times daily as needed (rash on chest for up to 10 days)., Disp: 30 g, Rfl: 0   fluticasone (FLONASE) 50 MCG/ACT nasal spray, , Disp: , Rfl:    Loratadine (CLARITIN PO), Take by mouth., Disp: , Rfl:    Magnesium 500 MG CAPS, Take 1,000 mg by mouth at bedtime. , Disp: , Rfl:    OneTouch Delica Lancets 62I MISC, Apply topically., Disp: , Rfl:    ONETOUCH VERIO test strip, 1 each daily., Disp: , Rfl:    pantoprazole (PROTONIX) 40 MG tablet, TAKE 1 TABLET BY MOUTH DAILY. MAY TAKE AN EXTRA TABLET IN THE EVENING IF NEEDED., Disp: 135 tablet, Rfl: 0   pravastatin (PRAVACHOL) 40 MG tablet, Take 1 tablet (40 mg total) by mouth daily. For cholesterol. To replace the Rosuvastatin, Disp: 90 tablet, Rfl: 3   triamterene-hydrochlorothiazide (MAXZIDE) 75-50 MG tablet, TAKE ONE (1) TABLET BY MOUTH EVERY DAY, Disp: 90 tablet, Rfl: 0 Social History   Socioeconomic History   Marital status: Married    Spouse name: Ayesha Rumpf   Number of children: 2   Years of education: 12   Highest education level: Some college, no degree  Occupational History   Occupation: disabled  Tobacco Use   Smoking status: Never   Smokeless tobacco: Never  Vaping Use   Vaping Use: Never used  Substance and Sexual Activity   Alcohol use: No   Drug use: No   Sexual activity: Not Currently  Other Topics Concern   Not on file  Social History Narrative   One daughter lives with them   Social Determinants of Health   Financial Resource Strain: Low Risk  (07/03/2021)   Overall Financial Resource Strain (CARDIA)    Difficulty of Paying Living Expenses: Not hard at all  Food Insecurity: No Food Insecurity (07/03/2021)   Hunger Vital Sign    Worried About Running Out of Food in the Last Year: Never true    Ran Out of Food in the Last Year: Never true  Transportation Needs: No Transportation Needs (07/03/2021)   PRAPARE - Hydrologist (Medical): No    Lack of Transportation  (Non-Medical): No  Physical Activity: Insufficiently Active (07/03/2021)   Exercise Vital Sign    Days of Exercise per Week: 7 days    Minutes of Exercise per Session: 20 min  Stress: No Stress Concern Present (07/03/2021)   [place]    Feeling of Stress : Not at all  Social Connections: Moderately Isolated (07/03/2021)   Social Connection and Isolation Panel [NHANES]    Frequency of Communication with Friends and Family: More than three times a week    Frequency of Social Gatherings with Friends and Family: More than three times a week    Attends Religious Services: Never    Marine scientist or Organizations: No    Attends Archivist Meetings: Never    Marital Status: Married  Human resources officer Violence: Not At Risk (07/03/2021)   Humiliation, Afraid, Rape, and Kick questionnaire    Fear of Current or Ex-Partner: No    Emotionally Abused: No    Physically Abused: No    Sexually Abused: No   Family History  Problem Relation Age of Onset   Breast cancer Mother    Cancer Mother        breast, colon, ovarian   Heart disease Father    Cancer Paternal Uncle        pancreatic   Deep vein thrombosis Brother    Aortic aneurysm Brother    ADD / ADHD Daughter    Asthma Daughter    Diabetes Maternal Grandmother    Stroke Maternal Grandmother    Hypothyroidism Maternal Grandfather    Heart disease Maternal Grandfather    Anuerysm Paternal Grandmother    Heart attack Paternal Grandfather     Objective: Office vital signs reviewed. BP 131/86   Pulse (!) 104   Temp (!) 97.3 F (36.3 Wendy)   Ht '5\' 4"'$  (1.626 m)   Wt 185 lb 9.6 oz (84.2 kg)   SpO2 94%   BMI 31.86 kg/m   Physical Examination:  General: Awake, alert, morbidly obese, No acute distress HEENT: Sclera white.  Moist mucous membranes Cardio: regular rate and rhythm, S1S2 heard, no murmurs appreciated Pulm: clear to auscultation bilaterally,  no wheezes, rhonchi or rales; normal work of breathing on room air MSK: Ambulating independently but gait is slightly antalgic.  Mild joint effusion appreciated right knee  Diabetic Foot Exam - Simple   Simple Foot Form Visual Inspection See comments: Yes Sensation Testing Intact to touch and monofilament testing bilaterally: Yes Pulse Check Posterior Tibialis and Dorsalis pulse intact bilaterally: Yes Comments Thickened, tortuous nails appreciated on bilateral feet.  No skin breakdown.      Assessment/ Plan: 61 y.o. female   Controlled type 2 diabetes mellitus with stage 2 chronic kidney disease, without long-term current use of insulin ([place]) - Plan: Bayer DCA Hb  A1c Waived, Microalbumin / creatinine urine ratio, Ambulatory referral to Podiatry  Hypertension associated with diabetes ([place]) - Plan: triamterene-hydrochlorothiazide (MAXZIDE) 75-50 MG tablet  Hyperlipidemia associated with type 2 diabetes mellitus ([place]) - Plan: LDL Cholesterol, Direct  Morbid obesity ([place]) - Plan: Ambulatory referral to Podiatry  Fatigue, unspecified type - Plan: CBC, TSH, T4, Free, Vitamin B12  Vitamin D deficiency - Plan: VITAMIN D 25 Hydroxy (Vit-D Deficiency, Fractures)  Need for immunization against influenza - Plan: Flu Vaccine QUAD 68moIM (Fluarix, Fluzone & Alfiuria Quad PF)  Gastroesophageal reflux disease without esophagitis - Plan: pantoprazole (PROTONIX) 40 MG tablet  Sugar remains under control without medical intervention.  A1c 6.5 today.  Foot exam performed.  Referral to podiatry for nail care  Blood pressure well controlled.  No changes.  Refill sent  Direct LDL collected because she is nonfasting.  Continue statin  Check B12, thyroid levels, CBC given reports of fatigue  Vitamin D level also collected today.  Flu shot given  Orders Placed This Encounter  Procedures   Bayer DCA Hb A1c Waived   Microalbumin / creatinine urine ratio   No orders of the defined types were  placed in this encounter.    AJanora Gilbert Wendy Gilbert W[phone]

## 2021-11-24 LAB — CBC
Hematocrit: 40.3 % (ref 34.0–46.6)
Hemoglobin: 13.4 g/dL (ref 11.1–15.9)
MCH: 27.7 pg (ref 26.6–33.0)
MCHC: 33.3 g/dL (ref 31.5–35.7)
MCV: 83 fL (ref 79–97)
Platelets: 323 10*3/uL (ref 150–450)
RBC: 4.84 x10E6/uL (ref 3.77–5.28)
RDW: 13.2 % (ref 11.7–15.4)
WBC: 5.9 10*3/uL (ref 3.4–10.8)

## 2021-11-24 LAB — T4, FREE: Free T4: 1.24 ng/dL (ref 0.82–1.77)

## 2021-11-24 LAB — MICROALBUMIN / CREATININE URINE RATIO
Creatinine, Urine: 61.9 mg/dL
Microalb/Creat Ratio: 13 mg/g creat (ref 0–29)
Microalbumin, Urine: 8.1 ug/mL

## 2021-11-24 LAB — TSH: TSH: 1.93 u[IU]/mL (ref 0.450–4.500)

## 2021-11-24 LAB — VITAMIN D 25 HYDROXY (VIT D DEFICIENCY, FRACTURES): Vit D, 25-Hydroxy: 36.9 ng/mL (ref 30.0–100.0)

## 2021-11-24 LAB — VITAMIN B12: Vitamin B-12: 365 pg/mL (ref 232–1245)

## 2021-11-24 LAB — LDL CHOLESTEROL, DIRECT: LDL Direct: 107 mg/dL — ABNORMAL HIGH (ref 0–99)

## 2021-11-26 NOTE — Progress Notes (Signed)
Patient r/c. Please call back

## 2021-12-13 DIAGNOSIS — B351 Tinea unguium: Secondary | ICD-10-CM | POA: Diagnosis not present

## 2021-12-13 DIAGNOSIS — M79676 Pain in unspecified toe(s): Secondary | ICD-10-CM | POA: Diagnosis not present

## 2021-12-13 DIAGNOSIS — E1142 Type 2 diabetes mellitus with diabetic polyneuropathy: Secondary | ICD-10-CM | POA: Diagnosis not present

## 2022-04-09 ENCOUNTER — Telehealth (INDEPENDENT_AMBULATORY_CARE_PROVIDER_SITE_OTHER): Payer: Medicare Other | Admitting: Family Medicine

## 2022-04-09 ENCOUNTER — Encounter: Payer: Self-pay | Admitting: Family Medicine

## 2022-04-09 DIAGNOSIS — B9689 Other specified bacterial agents as the cause of diseases classified elsewhere: Secondary | ICD-10-CM | POA: Diagnosis not present

## 2022-04-09 DIAGNOSIS — J019 Acute sinusitis, unspecified: Secondary | ICD-10-CM

## 2022-04-09 MED ORDER — FLUTICASONE PROPIONATE 50 MCG/ACT NA SUSP: 2.0000 | Freq: Every day | NASAL | 99 refills | Status: DC

## 2022-04-09 MED ORDER — DOXYCYCLINE HYCLATE 100 MG PO TABS: 100.0000 mg | ORAL_TABLET | Freq: Two times a day (BID) | ORAL | 0 refills | Status: AC

## 2022-04-09 NOTE — Progress Notes (Signed)
Phone visit  Subjective: [id number] PCP: Janora Norlander, DO [id number] Wendy Gilbert is a 62 y.o. female. Patient provides verbal consent for consult held via phone.  Due to COVID-19 pandemic this visit was conducted virtually. This visit type was conducted due to national recommendations for restrictions regarding the COVID-19 Pandemic (e.g. social distancing, sheltering in place) in an effort to limit this patient's exposure and mitigate transmission in our community. All issues noted in this document were discussed and addressed.  A physical exam was not performed with this format.   Location of patient: home Location of provider: [facility] Others present for call: daughter  1. Sinusitis Patient reports she started developing nasal congestion, low grade fevers for a couple of weeks. Using OTC cough and flu meds, decongestants but not helping.  Nasal discharge has gone from clear to yellow/green.  Using saline spray.   ROS: Per HPI  Allergies  Allergen Reactions   Demerol Other (See Comments)    MENTAL STATUS CHANGE   Erythromycin Nausea And Vomiting   Adhesive [Tape] Rash   Cephalexin Hives and Other (See Comments)    ABD. PAIN   Penicillins Other (See Comments)    UNKNOWN - WAS AS A CHILD   Past Medical History:  Diagnosis Date   Arthritis    "all over"   Diabetes mellitus without complication ([place])    DVT (deep venous thrombosis) ([place]) 10/2008   left arm   GERD (gastroesophageal reflux disease)    Headache(784.0)    sinus   Inflammatory carcinoma of right breast dx'd 07/2008   PONV (postoperative nausea and vomiting)    Seasonal allergies    Sinusitis    Vitamin D deficiency 04/13/2014    Current Outpatient Medications:    acetaminophen (TYLENOL) 650 MG CR tablet, Take 1,300 mg by mouth at bedtime., Disp: , Rfl:    clotrimazole-betamethasone (LOTRISONE) cream, Apply 1 Application topically 2 (two) times daily as needed (rash on chest for up to 10 days)., Disp: 30 g, Rfl:  0   fluticasone (FLONASE) 50 MCG/ACT nasal spray, , Disp: , Rfl:    Loratadine (CLARITIN PO), Take by mouth., Disp: , Rfl:    Magnesium 500 MG CAPS, Take 1,000 mg by mouth at bedtime. , Disp: , Rfl:    OneTouch Delica Lancets 99991111 MISC, Apply topically., Disp: , Rfl:    ONETOUCH VERIO test strip, 1 each daily., Disp: , Rfl:    pantoprazole (PROTONIX) 40 MG tablet, TAKE 1 TABLET BY MOUTH DAILY. MAY TAKE AN EXTRA TABLET IN THE EVENING IF NEEDED., Disp: 135 tablet, Rfl: 3   pravastatin (PRAVACHOL) 40 MG tablet, Take 1 tablet (40 mg total) by mouth daily. For cholesterol. To replace the Rosuvastatin, Disp: 90 tablet, Rfl: 3   triamterene-hydrochlorothiazide (MAXZIDE) 75-50 MG tablet, Take 1 tablet by mouth daily., Disp: 90 tablet, Rfl: 3  Assessment/ Plan: 62 y.o. female   Acute bacterial sinusitis - Plan: doxycycline (VIBRA-TABS) 100 MG tablet, fluticasone (FLONASE) 50 MCG/ACT nasal spray  Start doxy given length of illness that is refractory to conservative management.  Home care instructions reviewed and reasons for reevaluation discussed.  Follow up prn.  Start time: 12:45pm End time: 12:50pm  Total time spent on patient care (including video visit/ documentation): 5 minutes  [place], [place] [phone]

## 2022-04-25 DIAGNOSIS — B351 Tinea unguium: Secondary | ICD-10-CM | POA: Diagnosis not present

## 2022-04-25 DIAGNOSIS — M79676 Pain in unspecified toe(s): Secondary | ICD-10-CM | POA: Diagnosis not present

## 2022-05-27 ENCOUNTER — Ambulatory Visit (INDEPENDENT_AMBULATORY_CARE_PROVIDER_SITE_OTHER): Payer: Medicare Other | Admitting: Family Medicine

## 2022-05-27 ENCOUNTER — Encounter: Payer: Self-pay | Admitting: Family Medicine

## 2022-05-27 VITALS — BP 131/89 | HR 95 | Temp 98.3°F | Ht 64.0 in | Wt 190.2 lb

## 2022-05-27 DIAGNOSIS — Z124 Encounter for screening for malignant neoplasm of cervix: Secondary | ICD-10-CM

## 2022-05-27 DIAGNOSIS — N183 Chronic kidney disease, stage 3 unspecified: Secondary | ICD-10-CM

## 2022-05-27 DIAGNOSIS — E785 Hyperlipidemia, unspecified: Secondary | ICD-10-CM | POA: Diagnosis not present

## 2022-05-27 DIAGNOSIS — E1122 Type 2 diabetes mellitus with diabetic chronic kidney disease: Secondary | ICD-10-CM

## 2022-05-27 DIAGNOSIS — E1159 Type 2 diabetes mellitus with other circulatory complications: Secondary | ICD-10-CM

## 2022-05-27 DIAGNOSIS — B351 Tinea unguium: Secondary | ICD-10-CM

## 2022-05-27 DIAGNOSIS — K219 Gastro-esophageal reflux disease without esophagitis: Secondary | ICD-10-CM | POA: Diagnosis not present

## 2022-05-27 DIAGNOSIS — E1169 Type 2 diabetes mellitus with other specified complication: Secondary | ICD-10-CM

## 2022-05-27 DIAGNOSIS — I152 Hypertension secondary to endocrine disorders: Secondary | ICD-10-CM | POA: Diagnosis not present

## 2022-05-27 LAB — BAYER DCA HB A1C WAIVED: HB A1C (BAYER DCA - WAIVED): 6.5 % — ABNORMAL HIGH (ref 4.8–5.6)

## 2022-05-27 MED ORDER — PANTOPRAZOLE SODIUM 40 MG PO TBEC: DELAYED_RELEASE_TABLET | ORAL | 3 refills | Status: DC

## 2022-05-27 MED ORDER — TRIAMTERENE-HCTZ 75-50 MG PO TABS: 1.0000 | ORAL_TABLET | Freq: Every day | ORAL | 3 refills | Status: DC

## 2022-05-27 MED ORDER — CICLOPIROX 8 % EX SOLN
Freq: Every day | CUTANEOUS | 1 refills | Status: DC
Start: 2022-05-27 — End: 2022-09-11

## 2022-05-27 MED ORDER — PRAVASTATIN SODIUM 40 MG PO TABS: 40.0000 mg | ORAL_TABLET | Freq: Every day | ORAL | 3 refills | Status: DC

## 2022-05-27 NOTE — Progress Notes (Signed)
Subjective: CC:DM PCP: Raliegh Ip, DO [id number] Wendy Gilbert is a 62 y.o. female presenting to clinic today for:  1. Type 2 Diabetes with hypertension, hyperlipidemia:  Diabetes has been diet controlled.  She is compliant with triamterene-hydrochlorothiazide, Pravachol.  No reports of chest pain, shortness of breath, dizziness, blurred vision, polydipsia or polyuria  Last eye exam: Up-to-date. Last foot exam: Needs Last A1c:  Lab Results  Component Value Date   HGBA1C 6.5 (H) 11/23/2021   Nephropathy screen indicated?:  Needs Last flu, zoster and/or pneumovax:  Immunization History  Administered Date(s) Administered   Influenza,inj,Quad PF,6+ Mos 11/27/2012, 02/17/2020, 02/21/2021, 11/23/2021   Influenza-Unspecified 12/16/2013, 12/28/2014   Moderna Sars-Covid-2 Vaccination 05/07/2019, 06/09/2019   Pneumococcal Conjugate-13 08/21/2020   Tdap 02/21/2021   Zoster Recombinat (Shingrix) 08/22/2021   ROS: Per HPI  Allergies  Allergen Reactions   Demerol Other (See Comments)    MENTAL STATUS CHANGE   Erythromycin Nausea And Vomiting   Adhesive [Tape] Rash   Cephalexin Hives and Other (See Comments)    ABD. PAIN   Penicillins Other (See Comments)    UNKNOWN - WAS AS A CHILD   Past Medical History:  Diagnosis Date   Arthritis    "all over"   Diabetes mellitus without complication ([facility])    DVT (deep venous thrombosis) ([facility]) 10/2008   left arm   GERD (gastroesophageal reflux disease)    Headache(784.0)    sinus   Inflammatory carcinoma of right breast dx'd 07/2008   PONV (postoperative nausea and vomiting)    Seasonal allergies    Sinusitis    Vitamin D deficiency 04/13/2014    Current Outpatient Medications:    acetaminophen (TYLENOL) 650 MG CR tablet, Take 1,300 mg by mouth at bedtime., Disp: , Rfl:    clotrimazole-betamethasone (LOTRISONE) cream, Apply 1 Application topically 2 (two) times daily as needed (rash on chest for up to 10 days)., Disp: 30 g, Rfl: 0    fluticasone (FLONASE) 50 MCG/ACT nasal spray, Place 2 sprays into both nostrils daily., Disp: 16 g, Rfl: PRN   Loratadine (CLARITIN PO), Take by mouth., Disp: , Rfl:    Magnesium 500 MG CAPS, Take 1,000 mg by mouth at bedtime. , Disp: , Rfl:    OneTouch Delica Lancets 33G MISC, Apply topically., Disp: , Rfl:    ONETOUCH VERIO test strip, 1 each daily., Disp: , Rfl:    pantoprazole (PROTONIX) 40 MG tablet, TAKE 1 TABLET BY MOUTH DAILY. MAY TAKE AN EXTRA TABLET IN THE EVENING IF NEEDED., Disp: 135 tablet, Rfl: 3   pravastatin (PRAVACHOL) 40 MG tablet, Take 1 tablet (40 mg total) by mouth daily. For cholesterol. To replace the Rosuvastatin, Disp: 90 tablet, Rfl: 3   triamterene-hydrochlorothiazide (MAXZIDE) 75-50 MG tablet, Take 1 tablet by mouth daily., Disp: 90 tablet, Rfl: 3 Social History   Socioeconomic History   Marital status: Married    Spouse name: Pecola Leisure   Number of children: 2   Years of education: 12   Highest education level: Some college, no degree  Occupational History   Occupation: disabled  Tobacco Use   Smoking status: Never   Smokeless tobacco: Never  Vaping Use   Vaping Use: Never used  Substance and Sexual Activity   Alcohol use: No   Drug use: No   Sexual activity: Not Currently  Other Topics Concern   Not on file  Social History Narrative   One daughter lives with them   Social Determinants of Health   Financial  Resource Strain: Low Risk  (07/03/2021)   Overall Financial Resource Strain (CARDIA)    Difficulty of Paying Living Expenses: Not hard at all  Food Insecurity: No Food Insecurity (07/03/2021)   Hunger Vital Sign    Worried About Running Out of Food in the Last Year: Never true    Ran Out of Food in the Last Year: Never true  Transportation Needs: No Transportation Needs (07/03/2021)   PRAPARE - Administrator, Civil Service (Medical): No    Lack of Transportation (Non-Medical): No  Physical Activity: Insufficiently Active (07/03/2021)    Exercise Vital Sign    Days of Exercise per Week: 7 days    Minutes of Exercise per Session: 20 min  Stress: No Stress Concern Present (07/03/2021)   [employer] of Occupational Health - Occupational Stress Questionnaire    Feeling of Stress : Not at all  Social Connections: Moderately Isolated (07/03/2021)   Social Connection and Isolation Panel [NHANES]    Frequency of Communication with Friends and Family: More than three times a week    Frequency of Social Gatherings with Friends and Family: More than three times a week    Attends Religious Services: Never    Database administrator or Organizations: No    Attends Banker Meetings: Never    Marital Status: Married  Catering manager Violence: Not At Risk (07/03/2021)   Humiliation, Afraid, Rape, and Kick questionnaire    Fear of Current or Ex-Partner: No    Emotionally Abused: No    Physically Abused: No    Sexually Abused: No   Family History  Problem Relation Age of Onset   Breast cancer Mother    Cancer Mother        breast, colon, ovarian   Heart disease Father    Cancer Paternal Uncle        pancreatic   Deep vein thrombosis Brother    Aortic aneurysm Brother    ADD / ADHD Daughter    Asthma Daughter    Diabetes Maternal Grandmother    Stroke Maternal Grandmother    Hypothyroidism Maternal Grandfather    Heart disease Maternal Grandfather    Anuerysm Paternal Grandmother    Heart attack Paternal Grandfather     Objective: Office vital signs reviewed. BP 131/89   Pulse 95   Temp 98.3 F (36.8 Wendy)   Ht 5\' 4"  (1.626 m)   Wt 190 lb 3.2 oz (86.3 kg)   SpO2 96%   BMI 32.65 kg/m   Physical Examination:  General: Awake, alert, morbidly obese female, No acute distress HEENT: sclera white, MMM Cardio: regular rate and rhythm, S1S2 heard, no murmurs appreciated Pulm: clear to auscultation bilaterally, no wheezes, rhonchi or rales; normal work of breathing on room air Neuro: See diabetic  foot  Diabetic Foot Exam - Simple   Simple Foot Form Diabetic Foot exam was performed with the following findings: Yes 05/27/2022  5:23 PM  Visual Inspection No deformities, no ulcerations, no other skin breakdown bilaterally: Yes Sensation Testing Intact to touch and monofilament testing bilaterally: Yes Pulse Check Posterior Tibialis and Dorsalis pulse intact bilaterally: Yes Comments      Assessment/ Plan: 62 y.o. female   Controlled type 2 diabetes mellitus with stage 3 chronic kidney disease, without long-term current use of insulin - Plan: Microalbumin / creatinine urine ratio, Bayer DCA Hb A1c Waived, CANCELED: Bayer DCA Hb A1c Waived  Hyperlipidemia associated with type 2 diabetes mellitus - Plan: Lipid  Panel, CMP14+EGFR, pravastatin (PRAVACHOL) 40 MG tablet  Hypertension associated with diabetes - Plan: CMP14+EGFR, triamterene-hydrochlorothiazide (MAXZIDE) 75-50 MG tablet  Onychomycosis - Plan: ciclopirox (PENLAC) 8 % solution  Gastroesophageal reflux disease without esophagitis - Plan: pantoprazole (PROTONIX) 40 MG tablet  Screening for cervical cancer - Plan: Ambulatory referral to Obstetrics / Gynecology  Sugar remains under good control through diet alone.  A1c 6.5 today.  Urine microalbumin collected.  Blood pressure controlled.  Continue current regimen.  Rx resent.  Check electrolytes.  Continue statin.  Check fasting lipid and liver enzymes  Penlac solution sent for onychomycosis noted on foot exam today  PPI renewed  Referral to OB/GYN for cervical cancer screening.    Plan for fasting lipid panel at next visit.  Full physical exam at next visit  No orders of the defined types were placed in this encounter.  No orders of the defined types were placed in this encounter.    Raliegh Ip, DO [facility] [phone]

## 2022-05-28 LAB — CMP14+EGFR
ALT: 12 IU/L (ref 0–32)
AST: 17 IU/L (ref 0–40)
Albumin/Globulin Ratio: 1.3 (ref 1.2–2.2)
Albumin: 4.4 g/dL (ref 3.9–4.9)
Alkaline Phosphatase: 85 IU/L (ref 44–121)
BUN/Creatinine Ratio: 28 (ref 12–28)
BUN: 28 mg/dL — ABNORMAL HIGH (ref 8–27)
Bilirubin Total: 0.3 mg/dL (ref 0.0–1.2)
CO2: 21 mmol/L (ref 20–29)
Calcium: 9.9 mg/dL (ref 8.7–10.3)
Chloride: 99 mmol/L (ref 96–106)
Creatinine, Ser: 1.01 mg/dL — ABNORMAL HIGH (ref 0.57–1.00)
Globulin, Total: 3.3 g/dL (ref 1.5–4.5)
Glucose: 131 mg/dL — ABNORMAL HIGH (ref 70–99)
Potassium: 3.7 mmol/L (ref 3.5–5.2)
Sodium: 139 mmol/L (ref 134–144)
Total Protein: 7.7 g/dL (ref 6.0–8.5)
eGFR: 63 mL/min/{1.73_m2} (ref >59)

## 2022-05-28 LAB — LIPID PANEL
Chol/HDL Ratio: 3.1 ratio (ref 0.0–4.4)
Cholesterol, Total: 189 mg/dL (ref 100–199)
HDL: 61 mg/dL (ref >39)
LDL Chol Calc (NIH): 100 mg/dL — ABNORMAL HIGH (ref 0–99)
Triglycerides: 161 mg/dL — ABNORMAL HIGH (ref 0–149)
VLDL Cholesterol Cal: 28 mg/dL (ref 5–40)

## 2022-05-28 LAB — MICROALBUMIN / CREATININE URINE RATIO
Creatinine, Urine: 117.8 mg/dL
Microalb/Creat Ratio: 22 mg/g creat (ref 0–29)
Microalbumin, Urine: 25.8 ug/mL

## 2022-05-28 NOTE — Progress Notes (Signed)
Patient returning call. Please call back

## 2022-06-19 ENCOUNTER — Ambulatory Visit: Payer: Medicare Other | Admitting: Adult Health

## 2022-06-26 ENCOUNTER — Other Ambulatory Visit: Payer: Self-pay

## 2022-06-26 DIAGNOSIS — E559 Vitamin D deficiency, unspecified: Secondary | ICD-10-CM

## 2022-06-26 DIAGNOSIS — C50911 Malignant neoplasm of unspecified site of right female breast: Secondary | ICD-10-CM

## 2022-06-27 ENCOUNTER — Inpatient Hospital Stay: Payer: Medicare Other | Attending: Hematology

## 2022-06-27 DIAGNOSIS — Z86718 Personal history of other venous thrombosis and embolism: Secondary | ICD-10-CM | POA: Insufficient documentation

## 2022-06-27 DIAGNOSIS — Z803 Family history of malignant neoplasm of breast: Secondary | ICD-10-CM | POA: Insufficient documentation

## 2022-06-27 DIAGNOSIS — Z923 Personal history of irradiation: Secondary | ICD-10-CM | POA: Diagnosis not present

## 2022-06-27 DIAGNOSIS — K760 Fatty (change of) liver, not elsewhere classified: Secondary | ICD-10-CM | POA: Diagnosis not present

## 2022-06-27 DIAGNOSIS — C50911 Malignant neoplasm of unspecified site of right female breast: Secondary | ICD-10-CM

## 2022-06-27 DIAGNOSIS — E559 Vitamin D deficiency, unspecified: Secondary | ICD-10-CM | POA: Insufficient documentation

## 2022-06-27 DIAGNOSIS — Z853 Personal history of malignant neoplasm of breast: Secondary | ICD-10-CM | POA: Diagnosis not present

## 2022-06-27 DIAGNOSIS — Z800 Family history of malignant neoplasm of digestive organs: Secondary | ICD-10-CM | POA: Diagnosis not present

## 2022-06-27 LAB — CBC WITH DIFFERENTIAL/PLATELET
Abs Immature Granulocytes: 0.02 10*3/uL (ref 0.00–0.07)
Basophils Absolute: 0.1 10*3/uL (ref 0.0–0.1)
Basophils Relative: 1 %
Eosinophils Absolute: 0.3 10*3/uL (ref 0.0–0.5)
Eosinophils Relative: 4 %
HCT: 41.5 % (ref 36.0–46.0)
Hemoglobin: 13.1 g/dL (ref 12.0–15.0)
Immature Granulocytes: 0 %
Lymphocytes Relative: 40 %
Lymphs Abs: 2.8 10*3/uL (ref 0.7–4.0)
MCH: 27 pg (ref 26.0–34.0)
MCHC: 31.6 g/dL (ref 30.0–36.0)
MCV: 85.6 fL (ref 80.0–100.0)
Monocytes Absolute: 0.6 10*3/uL (ref 0.1–1.0)
Monocytes Relative: 9 %
Neutro Abs: 3.2 10*3/uL (ref 1.7–7.7)
Neutrophils Relative %: 46 %
Platelets: 311 10*3/uL (ref 150–400)
RBC: 4.85 MIL/uL (ref 3.87–5.11)
RDW: 13.4 % (ref 11.5–15.5)
WBC: 7 10*3/uL (ref 4.0–10.5)
nRBC: 0 % (ref 0.0–0.2)

## 2022-06-27 LAB — COMPREHENSIVE METABOLIC PANEL
ALT: 16 U/L (ref 0–44)
AST: 20 U/L (ref 15–41)
Albumin: 4 g/dL (ref 3.5–5.0)
Alkaline Phosphatase: 67 U/L (ref 38–126)
Anion gap: 10 (ref 5–15)
BUN: 27 mg/dL — ABNORMAL HIGH (ref 8–23)
CO2: 27 mmol/L (ref 22–32)
Calcium: 9.4 mg/dL (ref 8.9–10.3)
Chloride: 100 mmol/L (ref 98–111)
Creatinine, Ser: 0.99 mg/dL (ref 0.44–1.00)
GFR, Estimated: >60 mL/min (ref >60)
Glucose, Bld: 115 mg/dL — ABNORMAL HIGH (ref 70–99)
Potassium: 3.9 mmol/L (ref 3.5–5.1)
Sodium: 137 mmol/L (ref 135–145)
Total Bilirubin: 0.7 mg/dL (ref 0.3–1.2)
Total Protein: 8 g/dL (ref 6.5–8.1)

## 2022-06-27 LAB — VITAMIN D 25 HYDROXY (VIT D DEFICIENCY, FRACTURES): Vit D, 25-Hydroxy: 34.19 ng/mL (ref 30–100)

## 2022-07-04 ENCOUNTER — Encounter: Payer: Self-pay | Admitting: Hematology

## 2022-07-04 ENCOUNTER — Inpatient Hospital Stay: Payer: Medicare Other | Admitting: Hematology

## 2022-07-04 VITALS — BP 126/88 | HR 89 | Temp 97.9°F | Resp 18 | Wt 195.2 lb

## 2022-07-04 DIAGNOSIS — Z86718 Personal history of other venous thrombosis and embolism: Secondary | ICD-10-CM | POA: Diagnosis not present

## 2022-07-04 DIAGNOSIS — C50911 Malignant neoplasm of unspecified site of right female breast: Secondary | ICD-10-CM

## 2022-07-04 DIAGNOSIS — Z800 Family history of malignant neoplasm of digestive organs: Secondary | ICD-10-CM | POA: Diagnosis not present

## 2022-07-04 DIAGNOSIS — Z803 Family history of malignant neoplasm of breast: Secondary | ICD-10-CM | POA: Diagnosis not present

## 2022-07-04 DIAGNOSIS — Z853 Personal history of malignant neoplasm of breast: Secondary | ICD-10-CM | POA: Diagnosis not present

## 2022-07-04 DIAGNOSIS — Z923 Personal history of irradiation: Secondary | ICD-10-CM | POA: Diagnosis not present

## 2022-07-04 DIAGNOSIS — K760 Fatty (change of) liver, not elsewhere classified: Secondary | ICD-10-CM | POA: Diagnosis not present

## 2022-07-04 DIAGNOSIS — E559 Vitamin D deficiency, unspecified: Secondary | ICD-10-CM | POA: Diagnosis not present

## 2022-07-04 NOTE — Progress Notes (Signed)
[facility] [address] [place] [address]    Clinic Day:  07/04/2022  Referring physician: Raliegh Ip, DO  Patient Care Team: Raliegh Ip, DO as PCP - General (Family Medicine) Margaretmary Dys, MD as Consulting Physician (Radiation Oncology) Doreatha Massed, MD as Consulting Physician (Hematology) Lanelle Bal, DO as Consulting Physician (Gastroenterology) Randa Lynn, MD as Consulting Physician (Nephrology)   ASSESSMENT & PLAN:   Assessment: 1.  Stage IIIc inflammatory right breast cancer, triple negative: - Presentation with positive supraclavicular, infraclavicular, axillary nodes with eroding mass in the upper outer quadrant of the right breast, 12 x 11.5 cm, status post FEC x5 cycles, followed by carboplatin and Taxotere for 4 cycles, followed by bilateral mastectomies, Y PT 0, ypN0 on 02/23/2009. -Completed XRT in March 2011.   2.  Left upper extremity DVT: -Port-A-Cath induced and was treated for 6 months.  Port was removed.   3.  Fatty liver:  -Previous ultrasound showed fatty liver.     Plan: 1.  Stage IIIc inflammatory right breast cancer, triple negative: - She denies any new onset pains.  Bilateral mastectomy sites are within normal limits.  No palpable adenopathy in the axillary or supraclavicular region. - Labs from 06/27/2022: Normal LFTs and creatinine.  CBC was normal. - She wants to continue to follow-up at our cancer center.  RTC 1 year for follow-up with repeat exam and labs.   2.  Vitamin D deficiency: - Her vitamin D level is normal at 34.  She is not on any supplements.    Orders Placed This Encounter  Procedures   CBC with Differential/Platelet    Standing Status:   Future    Standing Expiration Date:   07/04/2023    Order Specific Question:   Release to patient    Answer:   Immediate   Comprehensive metabolic panel    Standing Status:   Future    Standing Expiration Date:   07/04/2023     Order Specific Question:   Release to patient    Answer:   Immediate      I,Katie Daubenspeck,acting as a scribe for Doreatha Massed, MD.,have documented all relevant documentation on the behalf of Doreatha Massed, MD,as directed by  Doreatha Massed, MD while in the presence of Doreatha Massed, MD.   I, Doreatha Massed MD, have reviewed the above documentation for accuracy and completeness, and I agree with the above.   Doreatha Massed, MD   5/23/20245:52 PM  CHIEF COMPLAINT:   Diagnosis: right breast cancer    Cancer Staging  Inflammatory carcinoma of right breast Staging form: Breast, AJCC 7th Edition - Clinical: Stage IIIC (T4d, N3c, cM0) - Signed by Ellouise Newer, PA on 08/01/2010    Prior Therapy: -FEC x5 cycles followed by carboplatin and Taxotere for 4 cycles -Bilateral mastectomy, YPT0Y PN 0 on 02/23/2009 -Completed XRT in March 2011  Current Therapy:  surveillance   HISTORY OF PRESENT ILLNESS:   Oncology History  Inflammatory carcinoma of right breast  08/01/2010 Initial Diagnosis   Inflammatory carcinoma of right breast      INTERVAL HISTORY:   Wendy Gilbert is a 62 y.o. female presenting to clinic today for follow up of right breast cancer. She was last seen by me on 06/26/20.  Today, she states that she is doing well overall. Her appetite level is at 100%. Her energy level is at 25%.  PAST MEDICAL HISTORY:   Past Medical History: Past Medical History:  Diagnosis Date   Arthritis    "all over"   Diabetes mellitus without complication (HCC)    DVT (deep venous thrombosis) (HCC) 10/2008   left arm   GERD (gastroesophageal reflux disease)    Headache(784.0)    sinus   Inflammatory carcinoma of right breast dx'd 07/2008   PONV (postoperative nausea and vomiting)    Seasonal allergies    Sinusitis    Vitamin D deficiency 04/13/2014    Surgical History: Past Surgical History:  Procedure Laterality Date   BIOPSY  12/13/2019    Procedure: BIOPSY;  Surgeon: Lanelle Bal, DO;  Location: [facility];  Service: Endoscopy;;  gastric polyps   CESAREAN SECTION  09/1999   COLONOSCOPY WITH PROPOFOL N/A 12/13/2019   Procedure: COLONOSCOPY WITH PROPOFOL;  Surgeon: Lanelle Bal, DO;  Location: [facility];  Service: Endoscopy;  Laterality: N/A;  12:30pm- patient will arrive as soon as she can.   DILATION AND CURETTAGE OF UTERUS  1978   ESOPHAGOGASTRODUODENOSCOPY (EGD) WITH PROPOFOL N/A 12/13/2019   Procedure: ESOPHAGOGASTRODUODENOSCOPY (EGD) WITH PROPOFOL;  Surgeon: Lanelle Bal, DO;  Location: [facility];  Service: Endoscopy;  Laterality: N/A;   INCISION AND DRAINAGE ABSCESS  10/25/2008   and debridement left axillary abscess, abd. wall abscess, left thigh abscess   KNEE ARTHROSCOPY Left 11/1996   MASTECTOMY Bilateral 02/23/2009   right modified radical, left simple   MASTECTOMY  2011    bilateral    POLYPECTOMY  12/13/2019   Procedure: POLYPECTOMY;  Surgeon: Lanelle Bal, DO;  Location: [facility];  Service: Endoscopy;;   PORT-A-CATH REMOVAL Left 05/07/2012   Procedure: REMOVAL PORT-A-CATH;  Surgeon: Adolph Pollack, MD;  Location: [facility];  Service: General;  Laterality: Left;   PORTACATH PLACEMENT  08/09/2008   TOTAL KNEE ARTHROPLASTY Left 08/10/2013   Procedure: LEFT TOTAL KNEE ARTHROPLASTY;  Surgeon: Jacki Cones, MD;  Location: [facility];  Service: Orthopedics;  Laterality: Left;   TUBAL LIGATION      Social History: Social History   Socioeconomic History   Marital status: Married    Spouse name: Pecola Leisure   Number of children: 2   Years of education: 12   Highest education level: Some college, no degree  Occupational History   Occupation: disabled  Tobacco Use   Smoking status: Never   Smokeless tobacco: Never  Vaping Use   Vaping Use: Never used  Substance and Sexual Activity   Alcohol use: No   Drug use: No   Sexual activity: Not Currently  Other Topics Concern    Not on file  Social History Narrative   One daughter lives with them   Social Determinants of Health   Financial Resource Strain: Low Risk  (07/03/2021)   Overall Financial Resource Strain (CARDIA)    Difficulty of Paying Living Expenses: Not hard at all  Food Insecurity: No Food Insecurity (07/03/2021)   Hunger Vital Sign    Worried About Running Out of Food in the Last Year: Never true    Ran Out of Food in the Last Year: Never true  Transportation Needs: No Transportation Needs (07/03/2021)   PRAPARE - Administrator, Civil Service (Medical): No    Lack of Transportation (Non-Medical): No  Physical Activity: Insufficiently Active (07/03/2021)   Exercise Vital Sign    Days of Exercise per Week: 7 days    Minutes of Exercise per Session: 20 min  Stress: No Stress Concern Present (07/03/2021)  [employer] of Occupational Health - Occupational Stress Questionnaire    Feeling of Stress : Not at all  Social Connections: Moderately Isolated (07/03/2021)   Social Connection and Isolation Panel [NHANES]    Frequency of Communication with Friends and Family: More than three times a week    Frequency of Social Gatherings with Friends and Family: More than three times a week    Attends Religious Services: Never    Database administrator or Organizations: No    Attends Banker Meetings: Never    Marital Status: Married  Catering manager Violence: Not At Risk (07/03/2021)   Humiliation, Afraid, Rape, and Kick questionnaire    Fear of Current or Ex-Partner: No    Emotionally Abused: No    Physically Abused: No    Sexually Abused: No    Family History: Family History  Problem Relation Age of Onset   Breast cancer Mother    Cancer Mother        breast, colon, ovarian   Heart disease Father    Cancer Paternal Uncle        pancreatic   Deep vein thrombosis Brother    Aortic aneurysm Brother    ADD / ADHD Daughter    Asthma Daughter    Diabetes Maternal  Grandmother    Stroke Maternal Grandmother    Hypothyroidism Maternal Grandfather    Heart disease Maternal Grandfather    Anuerysm Paternal Grandmother    Heart attack Paternal Grandfather     Current Medications:  Current Outpatient Medications:    acetaminophen (TYLENOL) 650 MG CR tablet, Take 1,300 mg by mouth at bedtime., Disp: , Rfl:    ciclopirox (PENLAC) 8 % solution, Apply topically at bedtime. Apply over nail and surrounding skin. Apply daily over previous coat. After seven (7) days, may remove with alcohol and continue cycle., Disp: 6.6 mL, Rfl: 1   clotrimazole-betamethasone (LOTRISONE) cream, Apply 1 Application topically 2 (two) times daily as needed (rash on chest for up to 10 days)., Disp: 30 g, Rfl: 0   fluticasone (FLONASE) 50 MCG/ACT nasal spray, Place 2 sprays into both nostrils daily., Disp: 16 g, Rfl: PRN   Loratadine (CLARITIN PO), Take by mouth., Disp: , Rfl:    Magnesium 500 MG CAPS, Take 1,000 mg by mouth at bedtime. , Disp: , Rfl:    OneTouch Delica Lancets 33G MISC, Apply topically., Disp: , Rfl:    ONETOUCH VERIO test strip, 1 each daily., Disp: , Rfl:    pantoprazole (PROTONIX) 40 MG tablet, TAKE 1 TABLET BY MOUTH DAILY. MAY TAKE AN EXTRA TABLET IN THE EVENING IF NEEDED., Disp: 135 tablet, Rfl: 3   pravastatin (PRAVACHOL) 40 MG tablet, Take 1 tablet (40 mg total) by mouth daily. For cholesterol. To replace the Rosuvastatin, Disp: 90 tablet, Rfl: 3   triamterene-hydrochlorothiazide (MAXZIDE) 75-50 MG tablet, Take 1 tablet by mouth daily., Disp: 90 tablet, Rfl: 3   Allergies: Allergies  Allergen Reactions   Demerol Other (See Comments)    MENTAL STATUS CHANGE   Erythromycin Nausea And Vomiting   Adhesive [Tape] Rash   Cephalexin Hives and Other (See Comments)    ABD. PAIN   Penicillins Other (See Comments)    UNKNOWN - WAS AS A CHILD    REVIEW OF SYSTEMS:   Review of Systems  Constitutional:  Negative for chills, fatigue and fever.  HENT:   Negative  for lump/mass, mouth sores, nosebleeds, sore throat and trouble swallowing.   Eyes:  Negative  for eye problems.  Respiratory:  Negative for cough and shortness of breath.   Cardiovascular:  Negative for chest pain, leg swelling and palpitations.  Gastrointestinal:  Negative for abdominal pain, constipation, diarrhea, nausea and vomiting.  Genitourinary:  Negative for bladder incontinence, difficulty urinating, dysuria, frequency, hematuria and nocturia.   Musculoskeletal:  Negative for arthralgias, back pain, flank pain, myalgias and neck pain.  Skin:  Negative for itching and rash.  Neurological:  Negative for dizziness, headaches and numbness.  Hematological:  Does not bruise/bleed easily.  Psychiatric/Behavioral:  Negative for depression, sleep disturbance and suicidal ideas. The patient is not nervous/anxious.   All other systems reviewed and are negative.    VITALS:   Blood pressure 126/88, pulse 89, temperature 97.9 F (36.6 C), temperature source Oral, resp. rate 18, weight 195 lb 3.2 oz (88.5 kg), SpO2 99 %.  Wt Readings from Last 3 Encounters:  07/04/22 195 lb 3.2 oz (88.5 kg)  05/27/22 190 lb 3.2 oz (86.3 kg)  11/23/21 185 lb 9.6 oz (84.2 kg)    Body mass index is 33.51 kg/m.  Performance status (ECOG): 1 - Symptomatic but completely ambulatory  PHYSICAL EXAM:   Physical Exam Vitals and nursing note reviewed. Exam conducted with a chaperone present.  Constitutional:      Appearance: Normal appearance.  Cardiovascular:     Rate and Rhythm: Normal rate and regular rhythm.     Pulses: Normal pulses.     Heart sounds: Normal heart sounds.  Pulmonary:     Effort: Pulmonary effort is normal.     Breath sounds: Normal breath sounds.  Abdominal:     Palpations: Abdomen is soft. There is no hepatomegaly, splenomegaly or mass.     Tenderness: There is no abdominal tenderness.  Musculoskeletal:     Right lower leg: No edema.     Left lower leg: No edema.   Lymphadenopathy:     Cervical: No cervical adenopathy.     Right cervical: No superficial, deep or posterior cervical adenopathy.    Left cervical: No superficial, deep or posterior cervical adenopathy.     Upper Body:     Right upper body: No supraclavicular or axillary adenopathy.     Left upper body: No supraclavicular or axillary adenopathy.  Neurological:     General: No focal deficit present.     Mental Status: She is alert and oriented to person, place, and time.  Psychiatric:        Mood and Affect: Mood normal.        Behavior: Behavior normal.     LABS:      Latest Ref Rng & Units 06/27/2022    3:03 PM 11/23/2021    4:27 PM 02/21/2021   12:05 PM  CBC  WBC 4.0 - 10.5 K/uL 7.0  5.9  5.7   Hemoglobin 12.0 - 15.0 g/dL 16.1  09.6  04.5   Hematocrit 36.0 - 46.0 % 41.5  40.3  39.9   Platelets 150 - 400 K/uL 311  323  340       Latest Ref Rng & Units 06/27/2022    3:03 PM 05/27/2022    9:47 AM 06/27/2021   11:39 AM  CMP  Glucose 70 - 99 mg/dL 409  811    BUN 8 - 23 mg/dL 27  28    Creatinine 9.14 - 1.00 mg/dL 7.82  9.56    Sodium 213 - 145 mmol/L 137  139    Potassium 3.5 - 5.1 mmol/L  3.9  3.7    Chloride 98 - 111 mmol/L 100  99    CO2 22 - 32 mmol/L 27  21    Calcium 8.9 - 10.3 mg/dL 9.4  9.9    Total Protein 6.5 - 8.1 g/dL 8.0  7.7  7.9   Total Bilirubin 0.3 - 1.2 mg/dL 0.7  0.3  0.3   Alkaline Phos 38 - 126 U/L 67  85  79   AST 15 - 41 U/L 20  17  17    ALT 0 - 44 U/L 16  12  15       Lab Results  Component Value Date   CEA <0.5 09/24/2013   /  CEA  Date Value Ref Range Status  09/24/2013 <0.5 0.0 - 5.0 ng/mL Final    Comment:    Performed at [facility]   No results found for: "PSA1" No results found for: "CAN199" No results found for: "CAN125"  No results found for: "TOTALPROTELP", "ALBUMINELP", "A1GS", "A2GS", "BETS", "BETA2SER", "GAMS", "MSPIKE", "SPEI" Lab Results  Component Value Date   FERRITIN 28 09/09/2011   No results found for:  "LDH"   STUDIES:   No results found.

## 2022-07-04 NOTE — Patient Instructions (Signed)
[facility]  Discharge Instructions  You were seen and examined today by Dr. Katragadda.  Dr. Katragadda discussed your most recent lab work which revealed that everything looks good and stable.  Follow-up as scheduled in 1 year.    Thank you for choosing [facility] [place] [facility] [place] to provide your oncology and hematology care.   To afford each patient quality time with our provider, please arrive at least 15 minutes before your scheduled appointment time. You may need to reschedule your appointment if you arrive late (10 or more minutes). Arriving late affects you and other patients whose appointments are after yours.  Also, if you miss three or more appointments without notifying the office, you may be dismissed from the clinic at the provider's discretion.    Again, thank you for choosing [facility].  Our hope is that these requests will decrease the amount of time that you wait before being seen by our physicians.   If you have a lab appointment with the Cancer Center - please note that after April 8th, all labs will be drawn in the cancer center.  You do not have to check in or register with the main entrance as you have in the past but will complete your check-in at the cancer center.            _____________________________________________________________  Should you have questions after your visit to [facility], please contact our office at [phone] and follow the prompts.  Our office hours are 8:00 a.m. to 4:30 p.m. Monday - Thursday and 8:00 a.m. to 2:30 p.m. Friday.  Please note that voicemails left after 4:00 p.m. may not be returned until the following business day.  We are closed weekends and all major holidays.  You do have access to a nurse 24-7, just call the main number to the clinic [phone] and do not press any options, hold on the line and a nurse will answer the phone.    For prescription refill  requests, have your pharmacy contact our office and allow 72 hours.    Masks are no longer required in the cancer centers. If you would like for your care team to wear a mask while they are taking care of you, please let them know. You may have one support person who is at least 64 years old accompany you for your appointments.  

## 2022-07-05 ENCOUNTER — Ambulatory Visit (INDEPENDENT_AMBULATORY_CARE_PROVIDER_SITE_OTHER): Payer: Medicare Other

## 2022-07-05 VITALS — Ht 64.0 in | Wt 195.0 lb

## 2022-07-05 DIAGNOSIS — Z0000 Encounter for general adult medical examination without abnormal findings: Secondary | ICD-10-CM | POA: Diagnosis not present

## 2022-07-05 NOTE — Patient Instructions (Signed)
Wendy Gilbert , Thank you for taking time to come for your Medicare Wellness Visit. I appreciate your ongoing commitment to your health goals. Please review the following plan we discussed and let me know if I can assist you in the future.   These are the goals we discussed:  Goals      Patient Stated     07/03/2021 AWV Goal: Keep All Scheduled Appointments  Over the next year, patient will attend all scheduled appointments with their PCP and any specialists that they see.         This is a list of the screening recommended for you and due dates:  Health Maintenance  Topic Date Due   Pap Smear  Never done   COVID-19 Vaccine (3 - Moderna risk series) 07/07/2019   Zoster (Shingles) Vaccine (2 of 2) 08/26/2022*   Flu Shot  09/12/2022   Eye exam for diabetics  10/26/2022   Hemoglobin A1C  11/26/2022   Yearly kidney health urinalysis for diabetes  05/27/2023   Complete foot exam   05/27/2023   Yearly kidney function blood test for diabetes  06/27/2023   Medicare Annual Wellness Visit  07/05/2023   DEXA scan (bone density measurement)  09/04/2025   Colon Cancer Screening  12/13/2026   DTaP/Tdap/Td vaccine (2 - Td or Tdap) 02/22/2031   HPV Vaccine  Aged Out   Hepatitis C Screening  Discontinued   HIV Screening  Discontinued  *Topic was postponed. The date shown is not the original due date.    Advanced directives: Advance directive discussed with you today. I have provided a copy for you to complete at home and have notarized. Once this is complete please bring a copy in to our office so we can scan it into your chart.   Conditions/risks identified: Aim for 30 minutes of exercise or brisk walking, 6-8 glasses of water, and 5 servings of fruits and vegetables each day.   Next appointment: Follow up in one year for your annual wellness visit.   Preventive Care 40-64 Years, Female Preventive care refers to lifestyle choices and visits with your health care provider that can promote health  and wellness. What does preventive care include? A yearly physical exam. This is also called an annual well check. Dental exams once or twice a year. Routine eye exams. Ask your health care provider how often you should have your eyes checked. Personal lifestyle choices, including: Daily care of your teeth and gums. Regular physical activity. Eating a healthy diet. Avoiding tobacco and drug use. Limiting alcohol use. Practicing safe sex. Taking low-dose aspirin daily starting at age 9. Taking vitamin and mineral supplements as recommended by your health care provider. What happens during an annual well check? The services and screenings done by your health care provider during your annual well check will depend on your age, overall health, lifestyle risk factors, and family history of disease. Counseling  Your health care provider may ask you questions about your: Alcohol use. Tobacco use. Drug use. Emotional well-being. Home and relationship well-being. Sexual activity. Eating habits. Work and work [employer]. Method of birth control. Menstrual cycle. Pregnancy history. Screening  You may have the following tests or measurements: Height, weight, and BMI. Blood pressure. Lipid and cholesterol levels. These may be checked every 5 years, or more frequently if you are over 81 years old. Skin check. Lung cancer screening. You may have this screening every year starting at age 55 if you have a 30-pack-year history of smoking  and currently smoke or have quit within the past 15 years. Fecal occult blood test (FOBT) of the stool. You may have this test every year starting at age 16. Flexible sigmoidoscopy or colonoscopy. You may have a sigmoidoscopy every 5 years or a colonoscopy every 10 years starting at age 46. Hepatitis C blood test. Hepatitis B blood test. Sexually transmitted disease (STD) testing. Diabetes screening. This is done by checking your blood sugar (glucose) after  you have not eaten for a while (fasting). You may have this done every 1-3 years. Mammogram. This may be done every 1-2 years. Talk to your health care provider about when you should start having regular mammograms. This may depend on whether you have a family history of breast cancer. BRCA-related cancer screening. This may be done if you have a family history of breast, ovarian, tubal, or peritoneal cancers. Pelvic exam and Pap test. This may be done every 3 years starting at age 58. Starting at age 12, this may be done every 5 years if you have a Pap test in combination with an HPV test. Bone density scan. This is done to screen for osteoporosis. You may have this scan if you are at high risk for osteoporosis. Discuss your test results, treatment options, and if necessary, the need for more tests with your health care provider. Vaccines  Your health care provider may recommend certain vaccines, such as: Influenza vaccine. This is recommended every year. Tetanus, diphtheria, and acellular pertussis (Tdap, Td) vaccine. You may need a Td booster every 10 years. Zoster vaccine. You may need this after age 47. Pneumococcal 13-valent conjugate (PCV13) vaccine. You may need this if you have certain conditions and were not previously vaccinated. Pneumococcal polysaccharide (PPSV23) vaccine. You may need one or two doses if you smoke cigarettes or if you have certain conditions. Talk to your health care provider about which screenings and vaccines you need and how often you need them. This information is not intended to replace advice given to you by your health care provider. Make sure you discuss any questions you have with your health care provider. Document Released: 02/24/2015 Document Revised: 10/18/2015 Document Reviewed: 11/29/2014 Elsevier Interactive Patient Education  2017 [employer].    Fall Prevention in the Home Falls can cause injuries. They can happen to people of all ages. There  are many things you can do to make your home safe and to help prevent falls. What can I do on the outside of my home? Regularly fix the edges of walkways and driveways and fix any cracks. Remove anything that might make you trip as you walk through a door, such as a raised step or threshold. Trim any bushes or trees on the path to your home. Use bright outdoor lighting. Clear any walking paths of anything that might make someone trip, such as rocks or tools. Regularly check to see if handrails are loose or broken. Make sure that both sides of any steps have handrails. Any raised decks and porches should have guardrails on the edges. Have any leaves, snow, or ice cleared regularly. Use sand or salt on walking paths during winter. Clean up any spills in your garage right away. This includes oil or grease spills. What can I do in the bathroom? Use night lights. Install grab bars by the toilet and in the tub and shower. Do not use towel bars as grab bars. Use non-skid mats or decals in the tub or shower. If you need to sit down  in the shower, use a plastic, non-slip stool. Keep the floor dry. Clean up any water that spills on the floor as soon as it happens. Remove soap buildup in the tub or shower regularly. Attach bath mats securely with double-sided non-slip rug tape. Do not have throw rugs and other things on the floor that can make you trip. What can I do in the bedroom? Use night lights. Make sure that you have a light by your bed that is easy to reach. Do not use any sheets or blankets that are too big for your bed. They should not hang down onto the floor. Have a firm chair that has side arms. You can use this for support while you get dressed. Do not have throw rugs and other things on the floor that can make you trip. What can I do in the kitchen? Clean up any spills right away. Avoid walking on wet floors. Keep items that you use a lot in easy-to-reach places. If you need to  reach something above you, use a strong step stool that has a grab bar. Keep electrical cords out of the way. Do not use floor polish or wax that makes floors slippery. If you must use wax, use non-skid floor wax. Do not have throw rugs and other things on the floor that can make you trip. What can I do with my stairs? Do not leave any items on the stairs. Make sure that there are handrails on both sides of the stairs and use them. Fix handrails that are broken or loose. Make sure that handrails are as long as the stairways. Check any carpeting to make sure that it is firmly attached to the stairs. Fix any carpet that is loose or worn. Avoid having throw rugs at the top or bottom of the stairs. If you do have throw rugs, attach them to the floor with carpet tape. Make sure that you have a light switch at the top of the stairs and the bottom of the stairs. If you do not have them, ask someone to add them for you. What else can I do to help prevent falls? Wear shoes that: Do not have high heels. Have rubber bottoms. Are comfortable and fit you well. Are closed at the toe. Do not wear sandals. If you use a stepladder: Make sure that it is fully opened. Do not climb a closed stepladder. Make sure that both sides of the stepladder are locked into place. Ask someone to hold it for you, if possible. Clearly mark and make sure that you can see: Any grab bars or handrails. First and last steps. Where the edge of each step is. Use tools that help you move around (mobility aids) if they are needed. These include: Canes. Walkers. Scooters. Crutches. Turn on the lights when you go into a dark area. Replace any light bulbs as soon as they burn out. Set up your furniture so you have a clear path. Avoid moving your furniture around. If any of your floors are uneven, fix them. If there are any pets around you, be aware of where they are. Review your medicines with your doctor. Some medicines can make  you feel dizzy. This can increase your chance of falling. Ask your doctor what other things that you can do to help prevent falls. This information is not intended to replace advice given to you by your health care provider. Make sure you discuss any questions you have with your health care provider. Document  Released: 11/24/2008 Document Revised: 07/06/2015 Document Reviewed: 03/04/2014 Elsevier Interactive Patient Education  2017 [employer].

## 2022-07-05 NOTE — Progress Notes (Signed)
Subjective:   Wendy Gilbert is a 62 y.o. female who presents for Medicare Annual (Subsequent) preventive examination. I connected with  Lekeshia Huestis Hoe on 07/05/22 by a audio enabled telemedicine application and verified that I am speaking with the correct person using two identifiers.  Patient Location: Home  Provider Location: Home Office  I discussed the limitations of evaluation and management by telemedicine. The patient expressed understanding and agreed to proceed.  Review of Systems     Cardiac Risk Factors include: advanced age (>17men, >18 women);dyslipidemia     Objective:    Today's Vitals   07/05/22 1422  Weight: 195 lb (88.5 kg)  Height: 5\' 4"  (1.626 m)   Body mass index is 33.47 kg/m.     07/05/2022    2:24 PM 07/04/2022    3:26 PM 07/03/2021   12:00 PM 06/28/2020    2:37 PM 06/26/2020    2:08 PM 12/13/2019    8:55 AM 06/28/2019    2:43 PM  Advanced Directives  Does Patient Have a Medical Advance Directive? No No No No No No No  Would patient like information on creating a medical advance directive? No - Patient declined No - Patient declined No - Patient declined No - Patient declined Yes (MAU/Ambulatory/Procedural Areas - Information given) No - Patient declined No - Patient declined    Current Medications (verified) Outpatient Encounter Medications as of 07/05/2022  Medication Sig   acetaminophen (TYLENOL) 650 MG CR tablet Take 1,300 mg by mouth at bedtime.   ciclopirox (PENLAC) 8 % solution Apply topically at bedtime. Apply over nail and surrounding skin. Apply daily over previous coat. After seven (7) days, may remove with alcohol and continue cycle.   clotrimazole-betamethasone (LOTRISONE) cream Apply 1 Application topically 2 (two) times daily as needed (rash on chest for up to 10 days).   fluticasone (FLONASE) 50 MCG/ACT nasal spray Place 2 sprays into both nostrils daily.   Loratadine (CLARITIN PO) Take by mouth.   Magnesium 500 MG CAPS Take 1,000 mg by  mouth at bedtime.    OneTouch Delica Lancets 33G MISC Apply topically.   ONETOUCH VERIO test strip 1 each daily.   pantoprazole (PROTONIX) 40 MG tablet TAKE 1 TABLET BY MOUTH DAILY. MAY TAKE AN EXTRA TABLET IN THE EVENING IF NEEDED.   pravastatin (PRAVACHOL) 40 MG tablet Take 1 tablet (40 mg total) by mouth daily. For cholesterol. To replace the Rosuvastatin   triamterene-hydrochlorothiazide (MAXZIDE) 75-50 MG tablet Take 1 tablet by mouth daily.   No facility-administered encounter medications on file as of 07/05/2022.    Allergies (verified) Demerol, Erythromycin, Adhesive [tape], Cephalexin, and Penicillins   History: Past Medical History:  Diagnosis Date   Arthritis    "all over"   Diabetes mellitus without complication (HCC)    DVT (deep venous thrombosis) (HCC) 10/2008   left arm   GERD (gastroesophageal reflux disease)    Headache(784.0)    sinus   Inflammatory carcinoma of right breast dx'd 07/2008   PONV (postoperative nausea and vomiting)    Seasonal allergies    Sinusitis    Vitamin D deficiency 04/13/2014   Past Surgical History:  Procedure Laterality Date   BIOPSY  12/13/2019   Procedure: BIOPSY;  Surgeon: Lanelle Bal, DO;  [facility]: [facility];  Service: Endoscopy;;  gastric polyps   CESAREAN SECTION  09/1999   COLONOSCOPY WITH PROPOFOL N/A 12/13/2019   Procedure: COLONOSCOPY WITH PROPOFOL;  Surgeon: Lanelle Bal, DO;  Location: [facility];  Service: Endoscopy;  Laterality: N/A;  12:30pm- patient will arrive as soon as she can.   DILATION AND CURETTAGE OF UTERUS  1978   ESOPHAGOGASTRODUODENOSCOPY (EGD) WITH PROPOFOL N/A 12/13/2019   Procedure: ESOPHAGOGASTRODUODENOSCOPY (EGD) WITH PROPOFOL;  Surgeon: Lanelle Bal, DO;  Location: [facility];  Service: Endoscopy;  Laterality: N/A;   INCISION AND DRAINAGE ABSCESS  10/25/2008   and debridement left axillary abscess, abd. wall abscess, left thigh abscess   KNEE ARTHROSCOPY Left 11/1996   MASTECTOMY  Bilateral 02/23/2009   right modified radical, left simple   MASTECTOMY  2011    bilateral    POLYPECTOMY  12/13/2019   Procedure: POLYPECTOMY;  Surgeon: Lanelle Bal, DO;  Location: [facility];  Service: Endoscopy;;   PORT-A-CATH REMOVAL Left 05/07/2012   Procedure: REMOVAL PORT-A-CATH;  Surgeon: Adolph Pollack, MD;  Location: [facility];  Service: General;  Laterality: Left;   PORTACATH PLACEMENT  08/09/2008   TOTAL KNEE ARTHROPLASTY Left 08/10/2013   Procedure: LEFT TOTAL KNEE ARTHROPLASTY;  Surgeon: Jacki Cones, MD;  Location: [facility];  Service: Orthopedics;  Laterality: Left;   TUBAL LIGATION     Family History  Problem Relation Age of Onset   Breast cancer Mother    Cancer Mother        breast, colon, ovarian   Heart disease Father    Cancer Paternal Uncle        pancreatic   Deep vein thrombosis Brother    Aortic aneurysm Brother    ADD / ADHD Daughter    Asthma Daughter    Diabetes Maternal Grandmother    Stroke Maternal Grandmother    Hypothyroidism Maternal Grandfather    Heart disease Maternal Grandfather    Anuerysm Paternal Grandmother    Heart attack Paternal Grandfather    Social History   Socioeconomic History   Marital status: Married    Spouse name: Pecola Leisure   Number of children: 2   Years of education: 12   Highest education level: Some college, no degree  Occupational History   Occupation: disabled  Tobacco Use   Smoking status: Never   Smokeless tobacco: Never  Vaping Use   Vaping Use: Never used  Substance and Sexual Activity   Alcohol use: No   Drug use: No   Sexual activity: Not Currently  Other Topics Concern   Not on file  Social History Narrative   One daughter lives with them   Social Determinants of Health   Financial Resource Strain: Low Risk  (07/05/2022)   Overall Financial Resource Strain (CARDIA)    Difficulty of Paying Living Expenses: Not hard at all  Food Insecurity: No Food Insecurity (07/05/2022)    Hunger Vital Sign    Worried About Running Out of Food in the Last Year: Never true    Ran Out of Food in the Last Year: Never true  Transportation Needs: No Transportation Needs (07/05/2022)   PRAPARE - Administrator, Civil Service (Medical): No    Lack of Transportation (Non-Medical): No  Physical Activity: Insufficiently Active (07/05/2022)   Exercise Vital Sign    Days of Exercise per Week: 3 days    Minutes of Exercise per Session: 30 min  Stress: No Stress Concern Present (07/05/2022)   [employer] of Occupational Health - Occupational Stress Questionnaire    Feeling of Stress : Not at all  Social Connections: Moderately Isolated (07/05/2022)   Social Connection and Isolation Panel [  NHANES]    Frequency of Communication with Friends and Family: More than three times a week    Frequency of Social Gatherings with Friends and Family: More than three times a week    Attends Religious Services: Never    Database administrator or Organizations: No    Attends Engineer, structural: Never    Marital Status: Married    Tobacco Counseling Counseling given: Not Answered   Clinical Intake:  Pre-visit preparation completed: Yes  Pain : No/denies pain     Nutritional Risks: None Diabetes: No  How often do you need to have someone help you when you read instructions, pamphlets, or other written materials from your doctor or pharmacy?: 1 - Never  Diabetic?no   Interpreter Needed?: No  Information entered by :: Renie Ora, LPN   Activities of Daily Living    07/05/2022    2:25 PM  In your present state of health, do you have any difficulty performing the following activities:  Hearing? 0  Vision? 0  Difficulty concentrating or making decisions? 0  Walking or climbing stairs? 0  Dressing or bathing? 0  Doing errands, shopping? 0  Preparing Food and eating ? N  Using the Toilet? N  In the past six months, have you accidently leaked urine? N   Do you have problems with loss of bowel control? N  Managing your Medications? N  Managing your Finances? N  Housekeeping or managing your Housekeeping? N    Patient Care Team: Raliegh Ip, DO as PCP - General (Family Medicine) Margaretmary Dys, MD as Consulting Physician (Radiation Oncology) Doreatha Massed, MD as Consulting Physician (Hematology) Lanelle Bal, DO as Consulting Physician (Gastroenterology) Randa Lynn, MD as Consulting Physician (Nephrology)  Indicate any recent Medical Services you may have received from other than [facility] providers in the past year (date may be approximate).     Assessment:   This is a routine wellness examination for Wendy Gilbert.  Hearing/Vision screen Vision Screening - Comments:: Wears rx glasses - up to date with [facility] exams with  [facility]  Dietary issues and exercise activities discussed: Current Exercise Habits: Home exercise routine, Type of exercise: walking, Time (Minutes): 30, Frequency (Times/Week): 3, Weekly Exercise (Minutes/Week): 90, Intensity: Mild, Exercise limited by: None identified   Goals Addressed             This Visit's Progress    Patient Stated   On track    07/03/2021 AWV Goal: Keep All Scheduled Appointments  Over the next year, patient will attend all scheduled appointments with their PCP and any specialists that they see.        Depression Screen    07/05/2022    2:24 PM 11/23/2021    4:27 PM 08/22/2021    3:55 PM 07/03/2021   11:58 AM 08/21/2020    3:00 PM 06/28/2020    2:41 PM 02/17/2020    3:13 PM  PHQ 2/9 Scores  PHQ - 2 Score 0 0 0 0 0 0 0  PHQ- 9 Score     5      Fall Risk    07/05/2022    2:23 PM 08/22/2021    3:55 PM 07/03/2021   11:56 AM 08/21/2020    3:00 PM 06/28/2020    2:35 PM  Fall Risk   Falls in the past year? 0 0 0 1 1  Number falls in past yr: 0  0 0 1  Injury with Fall? 0  0 1 1  Risk for fall due to : No Fall Risks  Orthopedic patient  History of  fall(s);Orthopedic patient;Medication side effect  Follow up Falls prevention discussed  Falls prevention discussed  Falls prevention discussed;Education provided    FALL RISK PREVENTION PERTAINING TO THE HOME:  Any stairs in or around the home? Yes  If so, are there any without handrails? No  Home free of loose throw rugs in walkways, pet beds, electrical cords, etc? Yes  Adequate lighting in your home to reduce risk of falls? Yes   ASSISTIVE DEVICES UTILIZED TO PREVENT FALLS:  Life alert? No  Use of a cane, walker or w/c? No  Grab bars in the bathroom? Yes  Shower chair or bench in shower? Yes  Elevated toilet seat or a handicapped toilet? Yes          07/05/2022    2:25 PM 07/03/2021   12:01 PM 06/28/2020    2:38 PM 06/28/2019    2:46 PM  6CIT Screen  What Year? 0 points 0 points  0 points  What month? 0 points 0 points  0 points  What time? 0 points 0 points 0 points 0 points  Count back from 20 0 points 0 points 0 points 0 points  Months in reverse 0 points 0 points 0 points 0 points  Repeat phrase 0 points 0 points 0 points 0 points  Total Score 0 points 0 points  0 points    Immunizations Immunization History  Administered Date(s) Administered   Influenza,inj,Quad PF,6+ Mos 11/27/2012, 02/17/2020, 02/21/2021, 11/23/2021   Influenza-Unspecified 12/16/2013, 12/28/2014   Moderna Sars-Covid-2 Vaccination 05/07/2019, 06/09/2019   Pneumococcal Conjugate-13 08/21/2020   Tdap 02/21/2021   Zoster Recombinat (Shingrix) 08/22/2021    TDAP status: Up to date  Flu Vaccine status: Up to date  Pneumococcal vaccine status: Up to date  Covid-19 vaccine status: Completed vaccines  Qualifies for Shingles Vaccine? Yes   Zostavax completed Yes   Shingrix Completed?: Yes  Screening Tests Health Maintenance  Topic Date Due   PAP SMEAR-Modifier  Never done   COVID-19 Vaccine (3 - Moderna risk series) 07/07/2019   Zoster Vaccines- Shingrix (2 of 2) 08/26/2022 (Originally  10/17/2021)   INFLUENZA VACCINE  09/12/2022   OPHTHALMOLOGY EXAM  10/26/2022   HEMOGLOBIN A1C  11/26/2022   Diabetic kidney evaluation - Urine ACR  05/27/2023   FOOT EXAM  05/27/2023   Diabetic kidney evaluation - eGFR measurement  06/27/2023   Medicare Annual Wellness (AWV)  07/05/2023   DEXA SCAN  09/04/2025   Colonoscopy  12/13/2026   DTaP/Tdap/Td (2 - Td or Tdap) 02/22/2031   HPV VACCINES  Aged Out   Hepatitis C Screening  Discontinued   HIV Screening  Discontinued    Health Maintenance  Health Maintenance Due  Topic Date Due   PAP SMEAR-Modifier  Never done   COVID-19 Vaccine (3 - Moderna risk series) 07/07/2019    Colorectal cancer screening: Type of screening: Colonoscopy. Completed 12/13/2019. Repeat every 7 years  Mammogram status: Ordered double mastectomy . Pt provided with contact info and advised to call to schedule appt.   Bone Density status: Completed 09/04/2020. Results reflect: Bone density results: OSTEOPENIA. Repeat every 5 years.  Lung Cancer Screening: (Low Dose CT Chest recommended if Age 81-80 years, 30 pack-year currently smoking OR have quit w/in 15years.) does not qualify.   Lung Cancer Screening Referral: n/a  Additional Screening:  Hepatitis C Screening: does not qualify;   Vision Screening: Recommended  annual ophthalmology exams for early detection of glaucoma and other disorders of the eye. Is the patient up to date with their annual eye exam?  Yes  Who is the provider or what is the name of the office in which the patient attends annual eye exams? [facility]  If pt is not established with a provider, would they like to be referred to a provider to establish care? No .   Dental Screening: Recommended annual dental exams for proper oral hygiene  Community Resource Referral / Chronic Care Management: CRR required this visit?  No   CCM required this visit?  No      Plan:     I have personally reviewed and noted the following in the patient's  chart:   Medical and social history Use of alcohol, tobacco or illicit drugs  Current medications and supplements including opioid prescriptions. Patient is not currently taking opioid prescriptions. Functional ability and status Nutritional status Physical activity Advanced directives List of other physicians Hospitalizations, surgeries, and ER visits in previous 12 months Vitals Screenings to include cognitive, depression, and falls Referrals and appointments  In addition, I have reviewed and discussed with patient certain preventive protocols, quality metrics, and best practice recommendations. A written personalized care plan for preventive services as well as general preventive health recommendations were provided to patient.     Lorrene Reid, LPN   07/16/5407   Nurse Notes: Declines second Shingrix vaccine

## 2022-07-24 ENCOUNTER — Ambulatory Visit: Payer: Medicare Other | Admitting: Adult Health

## 2022-07-25 DIAGNOSIS — M79676 Pain in unspecified toe(s): Secondary | ICD-10-CM | POA: Diagnosis not present

## 2022-07-25 DIAGNOSIS — B351 Tinea unguium: Secondary | ICD-10-CM | POA: Diagnosis not present

## 2022-09-11 ENCOUNTER — Encounter: Payer: Self-pay | Admitting: Adult Health

## 2022-09-11 ENCOUNTER — Ambulatory Visit (INDEPENDENT_AMBULATORY_CARE_PROVIDER_SITE_OTHER): Payer: Medicare Other | Admitting: Adult Health

## 2022-09-11 ENCOUNTER — Other Ambulatory Visit (HOSPITAL_COMMUNITY)
Admission: RE | Admit: 2022-09-11 | Discharge: 2022-09-11 | Disposition: A | Discharge status: Home / Self Care | Payer: Medicare Other | Source: Ambulatory Visit | Attending: Adult Health | Admitting: Adult Health

## 2022-09-11 VITALS — BP 130/85 | HR 103 | Ht 64.0 in | Wt 201.0 lb

## 2022-09-11 DIAGNOSIS — Z01419 Encounter for gynecological examination (general) (routine) without abnormal findings: Secondary | ICD-10-CM | POA: Insufficient documentation

## 2022-09-11 DIAGNOSIS — Z1211 Encounter for screening for malignant neoplasm of colon: Secondary | ICD-10-CM | POA: Diagnosis not present

## 2022-09-11 DIAGNOSIS — Z1151 Encounter for screening for human papillomavirus (HPV): Secondary | ICD-10-CM | POA: Insufficient documentation

## 2022-09-11 LAB — HEMOCCULT GUIAC POC 1CARD (OFFICE): Fecal Occult Blood, POC: NEGATIVE

## 2022-09-11 NOTE — Progress Notes (Signed)
Patient ID: Wendy Gilbert, female   DOB: 08/02/1960, 62 y.o.   MRN: [id number] History of Present Illness: Wendy Gilbert is a 62 year old white female,married, PM in for a well woman gyn exam and pap. Last pap about 23 years ago.  PCP is Dr Nadine Counts   Current Medications, Allergies, Past Medical History, Past Surgical History, Family History and Social History were reviewed in [employer] electronic medical record.     Review of Systems: Patient denies any headaches, hearing loss, fatigue, blurred vision, shortness of breath, chest pain, abdominal pain, problems with bowel movements, urination, or intercourse.(Not active). No joint pain or mood swings.  She denies any vaginal bleeding.   Physical Exam:BP 130/85 (BP Location: Left Arm, Patient Position: Supine, Cuff Size: Normal)   Pulse (!) 103   Ht 5\' 4"  (1.626 m)   Wt 201 lb (91.2 kg)   BMI 34.50 kg/m   General:  Well developed, well nourished, no acute distress Skin:  Warm and dry Neck:  Midline trachea, normal thyroid, good ROM, no lymphadenopathy Lungs; Clear to auscultation bilaterally Breast:  sp bilateral mastectomy for breast cancer at age 62 Cardiovascular: Regular rate and rhythm Abdomen:  Soft, non tender, no hepatosplenomegaly, has small papules,she says they itch Pelvic:  External genitalia is normal in appearance, no lesions.  The vagina is pale. Urethra has no lesions or masses. The cervix is smooth, pap with HR HPV Genotyping performed.  Uterus is felt to be normal size, shape, and contour.  No adnexal masses or tenderness noted.Bladder is non tender, no masses felt. Rectal: Good sphincter tone, no polyps, or hemorrhoids felt.  Hemoccult negative. Extremities/musculoskeletal:  No swelling or varicosities noted, no clubbing or cyanosis Psych:  No mood changes, alert and cooperative,seems happy AA is 0 Fall risk is low    09/11/2022    1:51 PM 07/05/2022    2:24 PM 11/23/2021    4:27 PM  Depression screen PHQ 2/9   Decreased Interest 0 0 0  Down, Depressed, Hopeless 0 0 0  PHQ - 2 Score 0 0 0  Altered sleeping 1    Tired, decreased energy 2    Change in appetite 0    Feeling bad or failure about yourself  0    Trouble concentrating 0    Moving slowly or fidgety/restless 0    Suicidal thoughts 0    PHQ-9 Score 3         09/11/2022    1:52 PM 11/23/2021    4:26 PM 08/22/2021    3:56 PM 08/21/2020    3:01 PM  GAD 7 : Generalized Anxiety Score  Nervous, Anxious, on Edge 0 0 0 1  Control/stop worrying 0 0 0 0  Worry too much - different things 0 0 0 0  Trouble relaxing 0 0 0 0  Restless 0 0 0 0  Easily annoyed or irritable 0 0 0 0  Afraid - awful might happen 0 0 0 0  Total GAD 7 Score 0 0 0 1  Anxiety Difficulty  Somewhat difficult Not difficult at all Somewhat difficult    Upstream - 09/11/22 1350       Pregnancy Intention Screening   Does the patient want to become pregnant in the next year? N/A    Does the patient's partner want to become pregnant in the next year? N/A    Would the patient like to discuss contraceptive options today? N/A      Contraception Wrap Up  Current Method Female Sterilization    End Method Female Sterilization    Contraception Counseling Provided No            Examination chaperoned by Malachy Mood LPN    Impression and Plan: 1. Encounter for gynecological examination with Papanicolaou smear of cervix Pap sent Pap in 3 years if normal  - Cytology - PAP( [place]) Physical with PCP Labs with PCP Colonoscopy per GI   2. Encounter for screening fecal occult blood testing Hemoccult was negative  - POCT occult blood stool

## 2022-09-17 ENCOUNTER — Encounter: Payer: Self-pay | Admitting: Family Medicine

## 2022-09-17 ENCOUNTER — Ambulatory Visit (INDEPENDENT_AMBULATORY_CARE_PROVIDER_SITE_OTHER): Payer: Medicare Other | Admitting: Family Medicine

## 2022-09-17 VITALS — BP 155/101 | HR 98 | Temp 97.8°F | Ht 64.0 in | Wt 202.8 lb

## 2022-09-17 DIAGNOSIS — L282 Other prurigo: Secondary | ICD-10-CM | POA: Diagnosis not present

## 2022-09-17 DIAGNOSIS — W57XXXA Bitten or stung by nonvenomous insect and other nonvenomous arthropods, initial encounter: Secondary | ICD-10-CM

## 2022-09-17 DIAGNOSIS — S30861A Insect bite (nonvenomous) of abdominal wall, initial encounter: Secondary | ICD-10-CM | POA: Diagnosis not present

## 2022-09-17 MED ORDER — PREDNISONE 20 MG PO TABS
40.0000 mg | ORAL_TABLET | Freq: Every day | ORAL | 0 refills | Status: AC
Start: 2022-09-17 — End: 2022-09-22

## 2022-09-17 MED ORDER — METHYLPREDNISOLONE ACETATE 40 MG/ML IJ SUSP
40.0000 mg | Freq: Once | INTRAMUSCULAR | Status: AC
Start: 2022-09-17 — End: 2022-09-17
  Administered 2022-09-17: 40 mg via INTRAMUSCULAR

## 2022-09-17 NOTE — Progress Notes (Signed)
Subjective:  Patient ID: Wendy Gilbert, female    DOB: 02-19-1960, 62 y.o.   MRN: [id number]  Patient Care Team: Raliegh Ip, DO as PCP - General (Family Medicine) Margaretmary Dys, MD as Consulting Physician (Radiation Oncology) Doreatha Massed, MD as Consulting Physician (Hematology) Lanelle Bal, DO as Consulting Physician (Gastroenterology) Randa Lynn, MD as Consulting Physician (Nephrology)   Chief Complaint:  [facility] (All over x 1 week )   HPI: Wendy Gilbert is a 62 y.o. female presenting on 09/17/2022 for Poison Ivy (All over x 1 week )   Pt presents today for pruritic rash and recent tick removal. States she has been exposed to poison oak. She has removed several ticks recently with the last being from her abdomen. She does report erythema to area and a pruritic rash.   Poison Lajoyce Corners This is a new problem. The problem has been gradually worsening since onset. The rash is diffuse. The rash is characterized by blistering, itchiness and redness. She was exposed to plant contact. Pertinent negatives include no anorexia, congestion, cough, diarrhea, eye pain, facial edema, fatigue, fever, joint pain, nail changes, rhinorrhea, shortness of breath, sore throat or vomiting. Past treatments include nothing. The treatment provided no relief.       Relevant past medical, surgical, family, and social history reviewed and updated as indicated.  Allergies and medications reviewed and updated. Data reviewed: Chart in Epic.   Past Medical History:  Diagnosis Date   Arthritis    "all over"   Diabetes mellitus without complication (HCC)    DVT (deep venous thrombosis) (HCC) 10/2008   left arm   GERD (gastroesophageal reflux disease)    Headache(784.0)    sinus   Inflammatory carcinoma of right breast dx'd 07/2008   PONV (postoperative nausea and vomiting)    Seasonal allergies    Sinusitis    Vitamin D deficiency 04/13/2014    Past Surgical History:   Procedure Laterality Date   BIOPSY  12/13/2019   Procedure: BIOPSY;  Surgeon: Lanelle Bal, DO;  [facility];  Service: Endoscopy;;  gastric polyps   CESAREAN SECTION  09/1999   COLONOSCOPY WITH PROPOFOL N/A 12/13/2019   Procedure: COLONOSCOPY WITH PROPOFOL;  Surgeon: Lanelle Bal, DO;  Location: [facility];  Service: Endoscopy;  Laterality: N/A;  12:30pm- patient will arrive as soon as she can.   DILATION AND CURETTAGE OF UTERUS  1978   ESOPHAGOGASTRODUODENOSCOPY (EGD) WITH PROPOFOL N/A 12/13/2019   Procedure: ESOPHAGOGASTRODUODENOSCOPY (EGD) WITH PROPOFOL;  Surgeon: Lanelle Bal, DO;  Location: [facility];  Service: Endoscopy;  Laterality: N/A;   INCISION AND DRAINAGE ABSCESS  10/25/2008   and debridement left axillary abscess, abd. wall abscess, left thigh abscess   KNEE ARTHROSCOPY Left 11/1996   MASTECTOMY Bilateral 02/23/2009   right modified radical, left simple   MASTECTOMY  2011    bilateral    POLYPECTOMY  12/13/2019   Procedure: POLYPECTOMY;  Surgeon: Lanelle Bal, DO;  [facility]: [facility];  Service: Endoscopy;;   PORT-A-CATH REMOVAL Left 05/07/2012   Procedure: REMOVAL PORT-A-CATH;  Surgeon: Adolph Pollack, MD;  Location: [facility];  Service: General;  Laterality: Left;   PORTACATH PLACEMENT  08/09/2008   TOTAL KNEE ARTHROPLASTY Left 08/10/2013   Procedure: LEFT TOTAL KNEE ARTHROPLASTY;  Surgeon: Jacki Cones, MD;  Location: [facility];  Service: Orthopedics;  Laterality: Left;   TUBAL LIGATION  Social History   Socioeconomic History   Marital status: Married    Spouse name: Pecola Leisure   Number of children: 2   Years of education: 12   Highest education level: Some college, no degree  Occupational History   Occupation: disabled  Tobacco Use   Smoking status: Never   Smokeless tobacco: Never  Vaping Use   Vaping status: Never Used  Substance and Sexual Activity   Alcohol use: No   Drug use: No   Sexual  activity: Not Currently    Birth control/protection: Surgical, Post-menopausal    Comment: tubal  Other Topics Concern   Not on file  Social History Narrative   One daughter lives with them   Social Determinants of Health   Financial Resource Strain: Medium Risk (09/11/2022)   Overall Financial Resource Strain (CARDIA)    Difficulty of Paying Living Expenses: Somewhat hard  Food Insecurity: No Food Insecurity (09/11/2022)   Hunger Vital Sign    Worried About Running Out of Food in the Last Year: Never true    Ran Out of Food in the Last Year: Never true  Transportation Needs: No Transportation Needs (09/11/2022)   PRAPARE - Administrator, Civil Service (Medical): No    Lack of Transportation (Non-Medical): No  Physical Activity: Inactive (09/11/2022)   Exercise Vital Sign    Days of Exercise per Week: 0 days    Minutes of Exercise per Session: 0 min  Stress: No Stress Concern Present (09/11/2022)   [employer] of Occupational Health - Occupational Stress Questionnaire    Feeling of Stress : Only a little  Social Connections: Moderately Isolated (09/11/2022)   Social Connection and Isolation Panel [NHANES]    Frequency of Communication with Friends and Family: Twice a week    Frequency of Social Gatherings with Friends and Family: Once a week    Attends Religious Services: Never    Database administrator or Organizations: No    Attends Banker Meetings: Never    Marital Status: Married  Catering manager Violence: Not At Risk (09/11/2022)   Humiliation, Afraid, Rape, and Kick questionnaire    Fear of Current or Ex-Partner: No    Emotionally Abused: No    Physically Abused: No    Sexually Abused: No    Outpatient Encounter Medications as of 09/17/2022  Medication Sig   acetaminophen (TYLENOL) 650 MG CR tablet Take 1,300 mg by mouth at bedtime.   fluticasone (FLONASE) 50 MCG/ACT nasal spray Place 2 sprays into both nostrils daily.   Loratadine  (CLARITIN PO) Take by mouth.   pantoprazole (PROTONIX) 40 MG tablet TAKE 1 TABLET BY MOUTH DAILY. MAY TAKE AN EXTRA TABLET IN THE EVENING IF NEEDED.   pravastatin (PRAVACHOL) 40 MG tablet Take 1 tablet (40 mg total) by mouth daily. For cholesterol. To replace the Rosuvastatin   predniSONE (DELTASONE) 20 MG tablet Take 2 tablets (40 mg total) by mouth daily with breakfast for 5 days. 2 po daily for 5 days   triamterene-hydrochlorothiazide (MAXZIDE) 75-50 MG tablet Take 1 tablet by mouth daily.   No facility-administered encounter medications on file as of 09/17/2022.    Allergies  Allergen Reactions   Demerol Other (See Comments)    MENTAL STATUS CHANGE   Erythromycin Nausea And Vomiting   Adhesive [Tape] Rash   Cephalexin Hives and Other (See Comments)    ABD. PAIN   Penicillins Other (See Comments)    UNKNOWN - WAS AS A CHILD  Review of Systems  Constitutional:  Negative for activity change, appetite change, chills, fatigue and fever.  HENT: Negative.  Negative for congestion, rhinorrhea and sore throat.   Eyes: Negative.  Negative for pain.  Respiratory:  Negative for cough, chest tightness and shortness of breath.   Cardiovascular:  Negative for chest pain, palpitations and leg swelling.  Gastrointestinal:  Negative for abdominal pain, anorexia, blood in stool, constipation, diarrhea, nausea and vomiting.  Endocrine: Negative.   Genitourinary:  Negative for dysuria, frequency and urgency.  Musculoskeletal:  Positive for myalgias. Negative for arthralgias and joint pain.  Skin:  Positive for color change, rash and wound. Negative for nail changes.  Allergic/Immunologic: Negative.   Neurological:  Negative for dizziness and headaches.  Hematological: Negative.   Psychiatric/Behavioral:  Negative for confusion, hallucinations, sleep disturbance and suicidal ideas.   All other systems reviewed and are negative.       Objective:  BP (!) 155/101   Pulse 98   Temp 97.8 F  (36.6 C) (Temporal)   Ht 5\' 4"  (1.626 m)   Wt 202 lb 12.8 oz (92 kg)   BMI 34.81 kg/m    Wt Readings from Last 3 Encounters:  09/17/22 202 lb 12.8 oz (92 kg)  09/11/22 201 lb (91.2 kg)  07/05/22 195 lb (88.5 kg)    Physical Exam Vitals and nursing note reviewed.  Constitutional:      General: She is not in acute distress.    Appearance: Normal appearance. She is well-developed and well-groomed. She is obese. She is not ill-appearing, toxic-appearing or diaphoretic.  HENT:     Head: Normocephalic and atraumatic.     Jaw: There is normal jaw occlusion.     Right Ear: Hearing normal.     Left Ear: Hearing normal.     Nose: Nose normal.     Mouth/Throat:     Lips: Pink.     Mouth: Mucous membranes are moist.     Pharynx: Oropharynx is clear. Uvula midline.  Eyes:     General: Lids are normal.     Extraocular Movements: Extraocular movements intact.     Conjunctiva/sclera: Conjunctivae normal.     Pupils: Pupils are equal, round, and reactive to light.  Neck:     Trachea: Trachea and phonation normal.  Cardiovascular:     Rate and Rhythm: Normal rate and regular rhythm.     Chest Wall: PMI is not displaced.  Pulmonary:     Effort: Pulmonary effort is normal.  Abdominal:     General: Abdomen is protuberant.     Palpations: Abdomen is soft.  Musculoskeletal:     Cervical back: Normal range of motion and neck supple.     Right lower leg: No edema.     Left lower leg: No edema.  Skin:    General: Skin is warm and dry.     Capillary Refill: Capillary refill takes less than 2 seconds.     Coloration: Skin is not cyanotic, jaundiced or pale.     Findings: Erythema and rash present. Rash is vesicular.     Comments: Erythematous rash scattered to torso and extremities, some areas vesicular and in linear pattern.  Area of redness and swelling from recent tick removal to abdomen.   Neurological:     General: No focal deficit present.     Mental Status: She is alert and  oriented to person, place, and time.     Sensory: Sensation is intact.     Motor:  Motor function is intact.     Coordination: Coordination is intact.     Gait: Gait is intact.     Deep Tendon Reflexes: Reflexes are normal and symmetric.  Psychiatric:        Attention and Perception: Attention and perception normal.        Mood and Affect: Mood and affect normal.        Speech: Speech normal.        Behavior: Behavior normal. Behavior is cooperative.        Thought Content: Thought content normal.        Cognition and Memory: Cognition and memory normal.        Judgment: Judgment normal.     Results for orders placed or performed in visit on 09/11/22  POCT occult blood stool  Result Value Ref Range   Fecal Occult Blood, POC Negative Negative   Card #1 Date     Card #2 Fecal Occult Blod, POC     Card #2 Date     Card #3 Fecal Occult Blood, POC     Card #3 Date    Cytology - PAP( [place])  Result Value Ref Range   High risk HPV Negative    Adequacy      Satisfactory for evaluation. The presence or absence of an   Adequacy      endocervical/transformation zone component cannot be determined because   Adequacy of atrophy.    Diagnosis      - Negative for intraepithelial lesion or malignancy (NILM)   Comment Atrophic changes are present.    Comment Normal Reference Range HPV - Negative        Pertinent labs & imaging results that were available during my care of the patient were reviewed by me and considered in my medical decision making.  Assessment & Plan:  Shiree was seen today for poison ivy.  Diagnoses and all orders for this visit:  Tick bite of abdomen, initial encounter Pruritic rash Will burst with steroids today and then oral for the next 5 days. Due to tick bite, will check for tick borne illnesses. Likely poison ivy dermatitis, symptomatic care discussed in detail.  -     Spotted Fever Group Antibodies -     Alpha-Gal Panel -     Lyme Disease Serology  w/Reflex -     predniSONE (DELTASONE) 20 MG tablet; Take 2 tablets (40 mg total) by mouth daily with breakfast for 5 days. 2 po daily for 5 days     Continue all other maintenance medications.  Follow up plan: Return if symptoms worsen or fail to improve.   Continue healthy lifestyle choices, including diet (rich in fruits, vegetables, and lean proteins, and low in salt and simple carbohydrates) and exercise (at least 30 minutes of moderate physical activity daily).  Educational handout given for poison ivy  The above assessment and management plan was discussed with the patient. The patient verbalized understanding of and has agreed to the management plan. Patient is aware to call the clinic if they develop any new symptoms or if symptoms persist or worsen. Patient is aware when to return to the clinic for a follow-up visit. Patient educated on when it is appropriate to go to the emergency department.   Kari Baars, FNP-C [facility] [phone]

## 2022-10-02 LAB — ALPHA-GAL PANEL: O215-IgE Alpha-Gal: 0.38 kU/L — AB

## 2022-10-03 ENCOUNTER — Telehealth: Payer: Self-pay | Admitting: Family Medicine

## 2022-10-03 NOTE — Telephone Encounter (Signed)
Aware and verbalizes understanding.  

## 2022-10-03 NOTE — Telephone Encounter (Signed)
Pt c/o that she still has rash and has spread to back. She has had headaches, no fever but always fatigued.  States that "shot" did not help.

## 2022-10-10 ENCOUNTER — Ambulatory Visit (INDEPENDENT_AMBULATORY_CARE_PROVIDER_SITE_OTHER): Payer: Medicare Other | Admitting: Family

## 2022-10-10 ENCOUNTER — Encounter: Payer: Self-pay | Admitting: Family Medicine

## 2022-10-10 ENCOUNTER — Encounter: Payer: Self-pay | Admitting: Family

## 2022-10-10 VITALS — BP 121/80 | HR 90 | Temp 97.7°F | Resp 18 | Ht 64.0 in | Wt 199.8 lb

## 2022-10-10 DIAGNOSIS — R509 Fever, unspecified: Secondary | ICD-10-CM | POA: Diagnosis not present

## 2022-10-10 DIAGNOSIS — L282 Other prurigo: Secondary | ICD-10-CM

## 2022-10-10 DIAGNOSIS — S80862D Insect bite (nonvenomous), left lower leg, subsequent encounter: Secondary | ICD-10-CM | POA: Diagnosis not present

## 2022-10-10 DIAGNOSIS — R21 Rash and other nonspecific skin eruption: Secondary | ICD-10-CM

## 2022-10-10 DIAGNOSIS — W57XXXD Bitten or stung by nonvenomous insect and other nonvenomous arthropods, subsequent encounter: Secondary | ICD-10-CM | POA: Diagnosis not present

## 2022-10-10 DIAGNOSIS — M2550 Pain in unspecified joint: Secondary | ICD-10-CM | POA: Diagnosis not present

## 2022-10-10 DIAGNOSIS — R110 Nausea: Secondary | ICD-10-CM

## 2022-10-10 MED ORDER — DOXYCYCLINE HYCLATE 100 MG PO TABS: 100.0000 mg | ORAL_TABLET | Freq: Two times a day (BID) | ORAL | 0 refills | Status: DC

## 2022-10-10 MED ORDER — HYDROXYZINE PAMOATE 25 MG PO CAPS
25.0000 mg | ORAL_CAPSULE | Freq: Three times a day (TID) | ORAL | 0 refills | Status: AC | PRN
Start: 2022-10-10 — End: ?

## 2022-10-10 NOTE — Progress Notes (Signed)
Subjective:    Patient ID: Wendy Gilbert, female    DOB: Jun 07, 1960, 62 y.o.   MRN: [id number]  Chief Complaint  Patient presents with   Follow-up    Rmsf DIZZY HEADACHES RASH FATIGUE    PT presents to the office today with headache, fevers, dizziness, nausea, fatigue, joint pain, and rash that has been on going on over the last few weeks on her feet, back and bilateral arms and legs. She reports she has had multiple tick bites. She removed one from left lower leg yesterday.   She had a negative IgM [facility] spotted fever, but a positive IgG. States she has never had RMSF in the past. Reports her symptoms have worsen over the last week. She was treated for poison oak and completed prednisone and steroid injection.  Rash This is a new problem. The current episode started 1 to 4 weeks ago. The affected locations include the abdomen, left axilla, left foot, right foot, right lower leg and left lower leg. The rash is characterized by redness and itchiness. Associated symptoms include fatigue, a fever, joint pain and a sore throat. Pertinent negatives include no congestion, cough, diarrhea, rhinorrhea, shortness of breath or vomiting. Past treatments include oral steroids. The treatment provided mild relief.      Review of Systems  Constitutional:  Positive for fatigue and fever.  HENT:  Positive for sore throat. Negative for congestion and rhinorrhea.   Respiratory:  Negative for cough and shortness of breath.   Gastrointestinal:  Negative for diarrhea and vomiting.  Musculoskeletal:  Positive for joint pain.  Skin:  Positive for rash.  All other systems reviewed and are negative.      Objective:   Physical Exam Vitals reviewed.  Constitutional:      General: She is not in acute distress.    Appearance: She is well-developed. She is obese.  HENT:     Head: Normocephalic and atraumatic.  Eyes:     Pupils: Pupils are equal, round, and reactive to light.  Neck:     Thyroid: No  thyromegaly.  Cardiovascular:     Rate and Rhythm: Normal rate and regular rhythm.     Heart sounds: Normal heart sounds. No murmur heard. Pulmonary:     Effort: Pulmonary effort is normal. No respiratory distress.     Breath sounds: Normal breath sounds. No wheezing.  Abdominal:     General: Bowel sounds are normal. There is no distension.     Palpations: Abdomen is soft.     Tenderness: There is no abdominal tenderness.  Musculoskeletal:        General: No tenderness. Normal range of motion.     Cervical back: Normal range of motion and neck supple.  Skin:    General: Skin is warm and dry.     Findings: Rash present. Rash is papular.     Comments: Small papular rash on bilateral feet, arms, and legs, back, and abdomen.   Neurological:     Mental Status: She is alert and oriented to person, place, and time.     Cranial Nerves: No cranial nerve deficit.     Deep Tendon Reflexes: Reflexes are normal and symmetric.  Psychiatric:        Behavior: Behavior normal.        Thought Content: Thought content normal.        Judgment: Judgment normal.     BP 121/80   Pulse 90   Temp 97.7 F (36.5 C) (  Temporal)   Resp 18   Ht 5\' 4"  (1.626 m)   Wt 199 lb 12.8 oz (90.6 kg)   SpO2 94%   BMI 34.30 kg/m        Assessment & Plan:  Amahia Kumagai Gatling comes in today with chief complaint of Follow-up (Rmsf DIZZY HEADACHES RASH FATIGUE )   Diagnosis and orders addressed:  1. Tick bite of left lower leg, subsequent encounter - doxycycline (VIBRA-TABS) 100 MG tablet; Take 1 tablet (100 mg total) by mouth 2 (two) times daily.  Dispense: 42 tablet; Refill: 0  2. Fever, unspecified fever cause - doxycycline (VIBRA-TABS) 100 MG tablet; Take 1 tablet (100 mg total) by mouth 2 (two) times daily.  Dispense: 42 tablet; Refill: 0  3. Rash - doxycycline (VIBRA-TABS) 100 MG tablet; Take 1 tablet (100 mg total) by mouth 2 (two) times daily.  Dispense: 42 tablet; Refill: 0  4. Nausea - doxycycline  (VIBRA-TABS) 100 MG tablet; Take 1 tablet (100 mg total) by mouth 2 (two) times daily.  Dispense: 42 tablet; Refill: 0  5. Arthralgia, unspecified joint - doxycycline (VIBRA-TABS) 100 MG tablet; Take 1 tablet (100 mg total) by mouth 2 (two) times daily.  Dispense: 42 tablet; Refill: 0  6. Pruritic rash - hydrOXYzine (VISTARIL) 25 MG capsule; Take 1 capsule (25 mg total) by mouth every 8 (eight) hours as needed.  Dispense: 60 capsule; Refill: 0   Start doxycyline  Vistaril as needed -Wear protective clothing while outside- Long sleeves and long pants -Put insect repellent on all exposed skin and along clothing -Take a shower as soon as possible after being outside Follow up if symptoms worsen or do not improve     Jannifer Rodney, FNP

## 2022-10-10 NOTE — Patient Instructions (Signed)
[place] Spotted Fever [place] spotted fever is an infection caused by bacteria that spreads to people through the bite of certain ticks. The illness causes flu-like symptoms and a reddish-purple rash. The illness does not spread from person to person (is not contagious). When this condition is not treated right away, it can quickly become very serious. It can sometimes lead to long-term (chronic) health problems and can be life-threatening. What are the causes? This condition is caused by a type of bacteria (Rickettsia rickettsii) that is carried by [place] dog ticks, brown dog ticks, and [employer] wood ticks. The infection spreads through: A bite from an infected tick. Tick bites are usually painless, and they frequently are not noticed. Infected tick blood, body fluids, or feces that get into the body through damaged skin, such as a small cut or sore. This could happen while removing a tick from a pet or from another person. What increases the risk? The following factors may make you more likely to develop this condition: Spending a lot of time outdoors, especially in rural areas or areas with long grass or woods with high brush. Spending time outdoors during warm weather. Ticks are most active during warm weather. What are the signs or symptoms? Symptoms of this condition include: Fever. Headache. Eye redness or sensitivity to light. There may also be swelling around the eyes. A reddish-purple rash. This usually appears 2-4 days after the first symptoms begin. The rash often starts on the wrists, forearms, and ankles. It may then spread to the palms, the bottom of the feet, legs, and trunk. Muscle aches. Nausea or vomiting. Poor appetite. Abdominal pain. Symptoms may develop 2-14 days after a tick bite. How is this diagnosed? This condition is diagnosed based on: Your medical history. A physical exam. Blood tests. Whether you have recently been bitten by a tick or  spent time in areas where: Ticks are common. [facility] spotted fever is common. You may also have a skin biopsy if you have a rash. A skin biopsy is a procedure to remove a sample of your skin so that it can be checked under a microscope. How is this treated? This condition is treated with antibiotic medicines. It is important to begin treatment right away. In some cases, your health care provider may begin treatment before the diagnosis is confirmed. If your symptoms are severe, you may need to be treated in the hospital where you can get antibiotics and be monitored during treatment. Follow these instructions at home: Take over-the-counter and prescription medicines only as told by your health care provider. Take your antibiotic medicine as told by your health care provider. Do not stop taking the antibiotic even if you start to feel better. Rest as much as possible until you feel better. Return to your normal activities as told by your health care provider. Ask your health care provider what activities are safe for you. Drink enough fluid to keep your urine pale yellow. Keep all follow-up visits. This is important. Contact a health care provider if: You have a rash that gets more red or swollen. You have fluid draining from any areas of your rash. You develop a fever after being bitten by a tick. You develop a rash 2-4 days after experiencing flu-like symptoms. You bruise easily. You have bleeding from your gums. You have numbness or tingling in your arms or legs. Get help right away if: You have chest pain. You have shortness of breath. You have a severe headache.  You have vision or hearing problems. You feel confused. You have a seizure. You have severe abdominal pain. You have blood in your stool (feces). You are not able to control when you urinate or have bowel movements (incontinence). These symptoms may represent a serious problem that is an emergency. Do not wait to see  if the symptoms will go away. Get medical help right away. Call your local emergency services (911 in the [place] Do not drive yourself to the hospital. Summary [facility] spotted fever is a bacterial infection that spreads to people through contact with certain ticks. When this condition is not treated right away, it can quickly become very serious. Sometimes, it can lead to long-term (chronic) health problems and can be life-threatening. You are more likely to develop this infection if you spend time outdoors in warm weather and in areas with tall grass or brush. Symptoms of this condition include fever, headache, nausea, vomiting, abdominal pain, muscle aches, and a reddish-purple rash that usually appears 2-4 days after a fever. This condition is treated with antibiotic medicines. This information is not intended to replace advice given to you by your health care provider. Make sure you discuss any questions you have with your health care provider. Document Revised: 01/07/2020 Document Reviewed: 01/08/2020 Elsevier Patient Education  2024 [employer].

## 2022-10-23 ENCOUNTER — Telehealth: Payer: Self-pay | Admitting: *Deleted

## 2022-10-23 NOTE — Progress Notes (Signed)
  Care Coordination  Outreach Note  10/23/2022 Name: TANYIKA MULBERRY MRN: [id number] DOB: [birth year]   Care Coordination Outreach Attempts: An unsuccessful telephone outreach was attempted today to offer the patient information about available care coordination services.  Follow Up Plan:  Additional outreach attempts will be made to offer the patient care coordination information and services.   Encounter Outcome:  No Answer  Gwenevere Ghazi  Care Coordination Care Guide  Direct Dial: [phone]

## 2022-10-23 NOTE — Progress Notes (Signed)
  Care Coordination   Note   10/23/2022 Name: Wendy Gilbert MRN: [id number] DOB: Dec 28, 1960  Mickel Duhamel Gloeckner is a 62 y.o. year old female who sees Raliegh Ip, DO for primary care. I reached out to Mickel Duhamel Ham by phone today to offer care coordination services.  Ms. Channel was given information about Care Coordination services today including:   The Care Coordination services include support from the care team which includes your Nurse Coordinator, Clinical Social Worker, or Pharmacist.  The Care Coordination team is here to help remove barriers to the health concerns and goals most important to you. Care Coordination services are voluntary, and the patient may decline or stop services at any time by request to their care team member.   Care Coordination Consent Status: Patient agreed to services and verbal consent obtained.   Follow up plan:  Telephone appointment with care coordination team member scheduled for:  10/25/22  Encounter Outcome:  Patient Scheduled  [facility] Coordination Care Guide  Direct Dial: [phone]

## 2022-10-24 DIAGNOSIS — M79676 Pain in unspecified toe(s): Secondary | ICD-10-CM | POA: Diagnosis not present

## 2022-10-24 DIAGNOSIS — B351 Tinea unguium: Secondary | ICD-10-CM | POA: Diagnosis not present

## 2022-10-25 ENCOUNTER — Ambulatory Visit: Payer: Self-pay | Admitting: *Deleted

## 2022-10-25 NOTE — Patient Instructions (Addendum)
Visit Information  Thank you for taking time to visit with me today. Please don't hesitate to contact me if I can be of assistance to you.   Following are the goals we discussed today:   Goals Addressed             This Visit's Progress    Home management of diabetes, hypertension (care coordination)       Interventions Today    Flowsheet Row Most Recent Value  Chronic Disease   Chronic disease during today's visit Diabetes, Hypertension (HTN), Other  General Interventions   General Interventions Discussed/Reviewed General Interventions Discussed, Durable Medical Equipment (DME), Doctor Visits  Doctor Visits Discussed/Reviewed Doctor Visits Discussed, PCP  Durable Medical Equipment (DME) BP Cuff, Glucomoter, Other  [pulse oximeter]              Our next appointment is by telephone on 11/29/22 at 1:30 pm  Please call the care guide team at [phone] if you need to cancel or reschedule your appointment.   If you are experiencing a Mental Health or Behavioral Health Crisis or need someone to talk to, please call the Suicide and Crisis Lifeline: 988 call the [place] National Suicide Prevention Lifeline: 512 823 2644 or TTY: (405)530-2724 TTY 629-164-8827) to talk to a trained counselor call 1-800-273-TALK (toll free, 24 hour hotline) call the [facility]: [phone] call 911   Patient verbalizes understanding of instructions and care plan provided today and agrees to view in MyChart. Active MyChart status and patient understanding of how to access instructions and care plan via MyChart confirmed with patient.     The patient has been provided with contact information for the care management team and has been advised to call with any health related questions or concerns.    Kaye Luoma L. Noelle Penner, RN, BSN, [facility]  VBCI Care Management Coordinator  [phone]  Fax: [phone]

## 2022-10-25 NOTE — Patient Outreach (Signed)
Care Coordination   Initial Visit Note   11/29/2022 Name: SKYLIA ANGELICA MRN: [id number] DOB: [birth year]  Mickel Duhamel Pfannenstiel is a 62 y.o. year old female who sees Raliegh Ip, DO for primary care. I spoke with  Mickel Duhamel Candler by phone today.  What matters to the patients health and wellness today?  Tick bites resolved No redness   Hypertension Episodes of incorrect blood pressure (BP) with use of blood pressure cuff that is too small. Retake of BP proved to be normal She is aware that it can be taken on bottom half of her arm for better result     Utility concerns in $200+ electric bills -may need resources if continues      Goals Addressed             This Visit's Progress    Home management of diabetes, hypertension (care coordination)       Interventions Today    Flowsheet Row Most Recent Value  Chronic Disease   Chronic disease during today's visit Diabetes, Hypertension (HTN), Other  General Interventions   General Interventions Discussed/Reviewed General Interventions Discussed, Durable Medical Equipment (DME), Doctor Visits  Doctor Visits Discussed/Reviewed Doctor Visits Discussed, PCP  Durable Medical Equipment (DME) BP Cuff, Glucomoter, Other  [pulse oximeter]              SDOH assessments and interventions completed:  No  SDOH Interventions Today    Flowsheet Row Most Recent Value  SDOH Interventions   Food Insecurity Interventions Intervention Not Indicated  Transportation Interventions Intervention Not Indicated  Utilities Interventions Intervention Not Indicated        Care Coordination Interventions:  Yes, provided   Follow up plan: Follow up call scheduled for 11/29/22    Encounter Outcome:  Patient Visit Completed   Cala Bradford L. Noelle Penner, RN, BSN, [facility]  VBCI Care Management Coordinator  [phone]  Fax: [phone]

## 2022-11-27 ENCOUNTER — Ambulatory Visit: Payer: Medicare Other | Admitting: Family Medicine

## 2022-11-27 ENCOUNTER — Encounter: Payer: Self-pay | Admitting: Family Medicine

## 2022-11-27 VITALS — BP 111/77 | HR 85 | Temp 98.7°F | Ht 64.0 in | Wt 201.0 lb

## 2022-11-27 DIAGNOSIS — R5382 Chronic fatigue, unspecified: Secondary | ICD-10-CM | POA: Diagnosis not present

## 2022-11-27 DIAGNOSIS — I152 Hypertension secondary to endocrine disorders: Secondary | ICD-10-CM | POA: Diagnosis not present

## 2022-11-27 DIAGNOSIS — Z23 Encounter for immunization: Secondary | ICD-10-CM | POA: Diagnosis not present

## 2022-11-27 DIAGNOSIS — E119 Type 2 diabetes mellitus without complications: Secondary | ICD-10-CM | POA: Diagnosis not present

## 2022-11-27 DIAGNOSIS — M25512 Pain in left shoulder: Secondary | ICD-10-CM | POA: Diagnosis not present

## 2022-11-27 DIAGNOSIS — J3489 Other specified disorders of nose and nasal sinuses: Secondary | ICD-10-CM

## 2022-11-27 DIAGNOSIS — E1159 Type 2 diabetes mellitus with other circulatory complications: Secondary | ICD-10-CM | POA: Diagnosis not present

## 2022-11-27 DIAGNOSIS — G8929 Other chronic pain: Secondary | ICD-10-CM | POA: Diagnosis not present

## 2022-11-27 DIAGNOSIS — Z0000 Encounter for general adult medical examination without abnormal findings: Secondary | ICD-10-CM | POA: Diagnosis not present

## 2022-11-27 DIAGNOSIS — E785 Hyperlipidemia, unspecified: Secondary | ICD-10-CM | POA: Diagnosis not present

## 2022-11-27 DIAGNOSIS — M25511 Pain in right shoulder: Secondary | ICD-10-CM | POA: Diagnosis not present

## 2022-11-27 DIAGNOSIS — E1169 Type 2 diabetes mellitus with other specified complication: Secondary | ICD-10-CM | POA: Diagnosis not present

## 2022-11-27 LAB — BAYER DCA HB A1C WAIVED: HB A1C (BAYER DCA - WAIVED): 7 % — ABNORMAL HIGH (ref 4.8–5.6)

## 2022-11-27 MED ORDER — MUPIROCIN CALCIUM 2 % EX CREA
1.0000 | TOPICAL_CREAM | Freq: Two times a day (BID) | CUTANEOUS | 0 refills | Status: AC
Start: 2022-11-27 — End: 2022-12-04

## 2022-11-27 MED ORDER — BUPROPION HCL ER (SR) 100 MG PO TB12: 100.0000 mg | ORAL_TABLET | Freq: Every morning | ORAL | 0 refills | Status: DC

## 2022-11-27 MED ORDER — CELECOXIB 50 MG PO CAPS: 50.0000 mg | ORAL_CAPSULE | Freq: Two times a day (BID) | ORAL | 0 refills | Status: DC | PRN

## 2022-11-27 NOTE — Patient Instructions (Signed)
Sugar is high. Need to cut back or we will have to start diabetes meds.

## 2022-11-27 NOTE — Progress Notes (Signed)
Wendy Gilbert is a 62 y.o. female presents to office today for annual physical exam examination.    Concerns today include: 1. Type 2 Diabetes with hypertension, hyperlipidemia:  Admits she is not following a diet.  She is compliant with her cholesterol and blood pressure medications.  Last eye exam: needs Last foot exam: UTD Last A1c:  Lab Results  Component Value Date   HGBA1C 6.5 (H) 05/27/2022   Nephropathy screen indicated?: UTD Last flu, zoster and/or pneumovax:  Immunization History  Administered Date(s) Administered   Influenza,inj,Quad PF,6+ Mos 11/27/2012, 02/17/2020, 02/21/2021, 11/23/2021   Influenza-Unspecified 12/16/2013, 12/28/2014   Moderna Sars-Covid-2 Vaccination 05/07/2019, 06/09/2019   Pneumococcal Conjugate-13 08/21/2020   Tdap 02/21/2021   Zoster Recombinant(Shingrix) 08/22/2021    ROS: Denies dizziness, LOC, polyuria, polydipsia, unintended weight loss/gain, foot ulcerations, numbness or tingling in extremities, shortness of breath or chest pain.  2.  Bilateral shoulder pain Patient reports bilateral shoulder pain.  This is refractory to oral analgesics.  3.  Nasal pimple She notes that there is a lesion on her right inner nostril that she thought was a plugged hair.  There was a scab and she pulled it off and it seemed to get a little better but in the following day it was sore again.  No reports of fevers, facial pain  4.  Chronic fatigue She is at the cancer center and read that Wellbutrin might help with chronic fatigue and she wants to know if she can try it.  No personal history of seizure disorder   Occupation: retired, Substance use: none Health Maintenance Due  Topic Date Due   COVID-19 Vaccine (3 - Moderna risk series) 07/07/2019   Zoster Vaccines- Shingrix (2 of 2) 10/17/2021   INFLUENZA VACCINE  09/12/2022   OPHTHALMOLOGY EXAM  10/26/2022   HEMOGLOBIN A1C  11/26/2022   Refills needed today: none  Immunization History  Administered  Date(s) Administered   Influenza,inj,Quad PF,6+ Mos 11/27/2012, 02/17/2020, 02/21/2021, 11/23/2021   Influenza-Unspecified 12/16/2013, 12/28/2014   Moderna Sars-Covid-2 Vaccination 05/07/2019, 06/09/2019   Pneumococcal Conjugate-13 08/21/2020   Tdap 02/21/2021   Zoster Recombinant(Shingrix) 08/22/2021   Past Medical History:  Diagnosis Date   Arthritis    "all over"   Diabetes mellitus without complication (HCC)    DVT (deep venous thrombosis) (HCC) 10/2008   left arm   GERD (gastroesophageal reflux disease)    Headache(784.0)    sinus   Inflammatory carcinoma of right breast dx'd 07/2008   PONV (postoperative nausea and vomiting)    Seasonal allergies    Sinusitis    Vitamin D deficiency 04/13/2014   Social History   Socioeconomic History   Marital status: Married    Spouse name: Optometrist   Number of children: 2   Years of education: 12   Highest education level: Some college, no degree  Occupational History   Occupation: disabled  Tobacco Use   Smoking status: Never   Smokeless tobacco: Never  Vaping Use   Vaping status: Never Used  Substance and Sexual Activity   Alcohol use: No   Drug use: No   Sexual activity: Not Currently    Birth control/protection: Surgical, Post-menopausal    Comment: tubal  Other Topics Concern   Not on file  Social History Narrative   One daughter lives with them   Social Determinants of Health   Financial Resource Strain: Medium Risk (09/11/2022)   Overall Financial Resource Strain (CARDIA)    Difficulty of Paying Living Expenses: Somewhat hard  Food Insecurity: No Food Insecurity (10/25/2022)   Hunger Vital Sign    Worried About Running Out of Food in the Last Year: Never true    Ran Out of Food in the Last Year: Never true  Transportation Needs: No Transportation Needs (10/25/2022)   PRAPARE - Administrator, Civil Service (Medical): No    Lack of Transportation (Non-Medical): No  Physical Activity: Inactive (09/11/2022)    Exercise Vital Sign    Days of Exercise per Week: 0 days    Minutes of Exercise per Session: 0 min  Stress: No Stress Concern Present (09/11/2022)   [employer] of Occupational Health - Occupational Stress Questionnaire    Feeling of Stress : Only a little  Social Connections: Moderately Isolated (09/11/2022)   Social Connection and Isolation Panel [NHANES]    Frequency of Communication with Friends and Family: Twice a week    Frequency of Social Gatherings with Friends and Family: Once a week    Attends Religious Services: Never    Database administrator or Organizations: No    Attends Banker Meetings: Never    Marital Status: Married  Catering manager Violence: Not At Risk (09/11/2022)   Humiliation, Afraid, Rape, and Kick questionnaire    Fear of Current or Ex-Partner: No    Emotionally Abused: No    Physically Abused: No    Sexually Abused: No   Past Surgical History:  Procedure Laterality Date   BIOPSY  12/13/2019   Procedure: BIOPSY;  Surgeon: Lanelle Bal, DO;  [facility];  Service: Endoscopy;;  gastric polyps   CESAREAN SECTION  09/1999   COLONOSCOPY WITH PROPOFOL N/A 12/13/2019   Procedure: COLONOSCOPY WITH PROPOFOL;  Surgeon: Lanelle Bal, DO;  Location: [facility];  Service: Endoscopy;  Laterality: N/A;  12:30pm- patient will arrive as soon as she can.   DILATION AND CURETTAGE OF UTERUS  1978   ESOPHAGOGASTRODUODENOSCOPY (EGD) WITH PROPOFOL N/A 12/13/2019   Procedure: ESOPHAGOGASTRODUODENOSCOPY (EGD) WITH PROPOFOL;  Surgeon: Lanelle Bal, DO;  Location: [facility];  Service: Endoscopy;  Laterality: N/A;   INCISION AND DRAINAGE ABSCESS  10/25/2008   and debridement left axillary abscess, abd. wall abscess, left thigh abscess   KNEE ARTHROSCOPY Left 11/1996   MASTECTOMY Bilateral 02/23/2009   right modified radical, left simple   MASTECTOMY  2011    bilateral    POLYPECTOMY  12/13/2019   Procedure: POLYPECTOMY;  Surgeon:  Lanelle Bal, DO;  Location: [facility];  Service: Endoscopy;;   PORT-A-CATH REMOVAL Left 05/07/2012   Procedure: REMOVAL PORT-A-CATH;  Surgeon: Adolph Pollack, MD;  Location: [facility];  Service: General;  Laterality: Left;   PORTACATH PLACEMENT  08/09/2008   TOTAL KNEE ARTHROPLASTY Left 08/10/2013   Procedure: LEFT TOTAL KNEE ARTHROPLASTY;  Surgeon: Jacki Cones, MD;  Location: [facility];  Service: Orthopedics;  Laterality: Left;   TUBAL LIGATION     Family History  Problem Relation Age of Onset   Breast cancer Mother    Cancer Mother        breast, colon, ovarian   Heart disease Father    Cancer Paternal Uncle        pancreatic   Deep vein thrombosis Brother    Aortic aneurysm Brother    ADD / ADHD Daughter    Asthma Daughter    Diabetes Maternal Grandmother    Stroke Maternal Grandmother    Hypothyroidism Maternal Grandfather  Heart disease Maternal Grandfather    Anuerysm Paternal Grandmother    Heart attack Paternal Grandfather     Current Outpatient Medications:    acetaminophen (TYLENOL) 650 MG CR tablet, Take 1,300 mg by mouth at bedtime., Disp: , Rfl:    doxycycline (VIBRA-TABS) 100 MG tablet, Take 1 tablet (100 mg total) by mouth 2 (two) times daily., Disp: 42 tablet, Rfl: 0   fluticasone (FLONASE) 50 MCG/ACT nasal spray, Place 2 sprays into both nostrils daily., Disp: 16 g, Rfl: PRN   hydrOXYzine (VISTARIL) 25 MG capsule, Take 1 capsule (25 mg total) by mouth every 8 (eight) hours as needed., Disp: 60 capsule, Rfl: 0   Loratadine (CLARITIN PO), Take by mouth., Disp: , Rfl:    pantoprazole (PROTONIX) 40 MG tablet, TAKE 1 TABLET BY MOUTH DAILY. MAY TAKE AN EXTRA TABLET IN THE EVENING IF NEEDED., Disp: 135 tablet, Rfl: 3   pravastatin (PRAVACHOL) 40 MG tablet, Take 1 tablet (40 mg total) by mouth daily. For cholesterol. To replace the Rosuvastatin, Disp: 90 tablet, Rfl: 3   triamterene-hydrochlorothiazide (MAXZIDE) 75-50 MG tablet, Take 1 tablet  by mouth daily., Disp: 90 tablet, Rfl: 3  Allergies  Allergen Reactions   Demerol Other (See Comments)    MENTAL STATUS CHANGE   Erythromycin Nausea And Vomiting   Adhesive [Tape] Rash   Cephalexin Hives and Other (See Comments)    ABD. PAIN   Penicillins Other (See Comments)    UNKNOWN - WAS AS A CHILD     ROS: Review of Systems Pertinent items noted in HPI and remainder of comprehensive ROS otherwise negative.    Physical exam BP 111/77   Pulse 85   Temp 98.7 F (37.1 C)   Ht 5\' 4"  (1.626 m)   Wt 201 lb (91.2 kg)   SpO2 94%   BMI 34.50 kg/m  General appearance: alert, cooperative, appears stated age, no distress, and morbidly obese Head: Normocephalic, without obvious abnormality, atraumatic Eyes: negative findings: lids and lashes normal, conjunctivae and sclerae normal, corneas clear, and pupils equal, round, reactive to light and accomodation Ears: normal TM's and external ear canals both ears Nose: Nares normal. Septum midline. Mucosa normal. No drainage or sinus tenderness.  Small pustule noted along the anterior inner aspect of the right nare.  No gross soft tissue swelling externally Throat: lips, mucosa, and tongue normal; teeth and gums normal Neck: no adenopathy, no carotid bruit, supple, symmetrical, trachea midline, and thyroid not enlarged, symmetric, no tenderness/mass/nodules Back: symmetric, no curvature. ROM normal. No CVA tenderness. Lungs: clear to auscultation bilaterally Breasts:  Surgically absent Heart: regular rate and rhythm, S1, S2 normal, no murmur, click, rub or gallop Abdomen: soft, non-tender; bowel sounds normal; no masses,  no organomegaly Extremities: extremities normal, atraumatic, no cyanosis or edema Pulses: 2+ and symmetric Skin: Skin color, texture, turgor normal. No rashes or lesions Lymph nodes: Cervical, supraclavicular, and axillary nodes normal. Neurologic: Grossly normal  MSK: Osteoarthritic changes to bilateral shoulders.   She is ambulating independently     11/27/2022    9:18 AM 10/25/2022    2:00 PM 09/11/2022    1:51 PM  Depression screen PHQ 2/9  Decreased Interest 0 0 0  Down, Depressed, Hopeless 0 0 0  PHQ - 2 Score 0 0 0  Altered sleeping 1  1  Tired, decreased energy 1  2  Change in appetite 0  0  Feeling bad or failure about yourself  0  0  Trouble concentrating 0  0  Moving slowly or fidgety/restless 0  0  Suicidal thoughts 0  0  PHQ-9 Score 2  3  Difficult doing work/chores Not difficult at all        11/27/2022    9:19 AM 09/11/2022    1:52 PM 11/23/2021    4:26 PM 08/22/2021    3:56 PM  GAD 7 : Generalized Anxiety Score  Nervous, Anxious, on Edge 0 0 0 0  Control/stop worrying 0 0 0 0  Worry too much - different things 0 0 0 0  Trouble relaxing 0 0 0 0  Restless 0 0 0 0  Easily annoyed or irritable 0 0 0 0  Afraid - awful might happen 0 0 0 0  Total GAD 7 Score 0 0 0 0  Anxiety Difficulty   Somewhat difficult Not difficult at all     Assessment/ Plan: Wendy Gilbert here for annual physical exam.   Annual physical exam  Diet-controlled diabetes mellitus (HCC) - Plan: Lipid Panel, Bayer DCA Hb A1c Waived  Hyperlipidemia associated with type 2 diabetes mellitus (HCC) - Plan: Lipid Panel  Hypertension associated with diabetes (HCC)  Morbid obesity (HCC)  Chronic pain of both shoulders - Plan: celecoxib (CELEBREX) 50 MG capsule  Chronic fatigue - Plan: buPROPion ER (WELLBUTRIN SR) 100 MG 12 hr tablet  Nasal vestibulitis - Plan: mupirocin cream (BACTROBAN) 2 %  Sugar is borderline with A1c at 7.0.  We discussed that she may need medication if it goes much beyond this.  I will see her closely back in 3 months rather than typical 6.  Check fasting lipid  Continue statin  Continue Maxide.  Blood pressure is controlled  Will offer GLP if cannot get sugar down  Celebrex trial for chronic pain.  Caution no NSAID use with this.  Wellbutrin trial for chronic  fatigue  Given minimal involvement of this irritation and infection Bactroban to inner nostril twice daily for 7 days.  She will contact me if does not resolve  Counseled on healthy lifestyle choices, including diet (rich in fruits, vegetables and lean meats and low in salt and simple carbohydrates) and exercise (at least 30 minutes of moderate physical activity daily).  Patient to follow up 3 months  Wendy Gilbert M. Nadine Counts, DO

## 2022-11-28 ENCOUNTER — Ambulatory Visit: Payer: Medicare Other

## 2022-11-29 ENCOUNTER — Ambulatory Visit: Payer: Self-pay | Admitting: *Deleted

## 2022-11-29 NOTE — Patient Instructions (Addendum)
Visit Information  Thank you for taking time to visit with me today. Please don't hesitate to contact me if I can be of assistance to you.   Following are the goals we discussed today:   Goals Addressed             This Visit's Progress    Home management of medicine/side effects, diabetes, hypertension, insect bites, utilities (care coordination)   On track    Interventions Today    Flowsheet Row Most Recent Value  Chronic Disease   Chronic disease during today's visit Hypertension (HTN), Other  [tick bites, utility, medicine additions (wellbutrin, celebrex)]  General Interventions   General Interventions Discussed/Reviewed General Interventions Reviewed, Doctor Visits, Community Resources  Doctor Visits Discussed/Reviewed Doctor Visits Reviewed, PCP  PCP/Specialist Visits Compliance with follow-up visit  Exercise Interventions   Exercise Discussed/Reviewed Exercise Discussed  Education Interventions   Education Provided Provided Web-based Education, Provided Education  [insect bites, celebrex, wellbutrin ER]  Provided Verbal Education On Medication, When to see the doctor, Other  Mental Health Interventions   Mental Health Discussed/Reviewed Mental Health Discussed, Coping Strategies  Pharmacy Interventions   Pharmacy Dicussed/Reviewed Pharmacy Topics Discussed, Medications and their functions, Affording Medications  [new medications Wellbutrin (increase energy history of fatigue), celebrex (joint pain) Encouraged to keep notes or diary of symptoms to report to medical providers-any  side effects. Encouraged sunlight-natural vitamin D to offer a boost of energy]  Safety Interventions   Safety Discussed/Reviewed Safety Discussed, Home Safety  [safety related to insect bites - ticks, spiders]              Our next appointment is by telephone on 01/29/23 at 1:30 pm  Please call the care guide team at [phone] if you need to cancel or reschedule your appointment.   If  you are experiencing a Mental Health or Behavioral Health Crisis or need someone to talk to, please call the Suicide and Crisis Lifeline: 988 call the [place] National Suicide Prevention Lifeline: 815-225-2300 or TTY: (912)490-4608 TTY (281)877-2136) to talk to a trained counselor call 1-800-273-TALK (toll free, 24 hour hotline) call the [facility]: ([phone] call 911   Patient verbalizes understanding of instructions and care plan provided today and agrees to view in MyChart. Active MyChart status and patient understanding of how to access instructions and care plan via MyChart confirmed with patient.     The patient has been provided with contact information for the care management team and has been advised to call with any health related questions or concerns.    Doran Nestle L. Noelle Penner, RN, BSN, [facility]  VBCI Care Management Coordinator  [phone]  Fax: [phone]

## 2022-11-29 NOTE — Patient Outreach (Signed)
Care Coordination   Follow Up Visit Note   11/29/2022 Name: Wendy Gilbert MRN: [id number] DOB: 1960/09/19  Wendy Gilbert is a 62 y.o. year old female who sees Raliegh Ip, DO for primary care. I spoke with  Wendy Gilbert by phone today.  What matters to the patients health and wellness today?  Wellbutrin to start this weekend  Drive daughter to work started [employer] not bleeding Wondering if she may need some vitamin -Plan to mow the yard this weekend and will see if she has any  Celebrex- started   Goals Addressed             This Visit's Progress    Home management of medicine/side effects, diabetes, hypertension, insect bites, utilities (care coordination)   On track    Interventions Today    Flowsheet Row Most Recent Value  Chronic Disease   Chronic disease during today's visit Hypertension (HTN), Other  [tick bites, utility, medicine additions (wellbutrin, celebrex)]  General Interventions   General Interventions Discussed/Reviewed General Interventions Reviewed, Doctor Visits, Community Resources  Doctor Visits Discussed/Reviewed Doctor Visits Reviewed, PCP  PCP/Specialist Visits Compliance with follow-up visit  Exercise Interventions   Exercise Discussed/Reviewed Exercise Discussed  Education Interventions   Education Provided Provided Web-based Education, Provided Education  [insect bites, celebrex, wellbutrin ER]  Provided Verbal Education On Medication, When to see the doctor, Other  Mental Health Interventions   Mental Health Discussed/Reviewed Mental Health Discussed, Coping Strategies  Pharmacy Interventions   Pharmacy Dicussed/Reviewed Pharmacy Topics Discussed, Medications and their functions, Affording Medications  [new medications Wellbutrin (increase energy history of fatigue), celebrex (joint pain) Encouraged to keep notes or diary of symptoms to report to medical providers-any  side effects. Encouraged sunlight-natural vitamin D to offer a boost of  energy]  Safety Interventions   Safety Discussed/Reviewed Safety Discussed, Home Safety  [safety related to insect bites - ticks, spiders]              SDOH assessments and interventions completed:  No     Care Coordination Interventions:  Yes, provided   Follow up plan: Follow up call scheduled for 01/29/23 1:30 pm    Encounter Outcome:  Patient Visit Completed   Treyten Monestime L. Noelle Penner, RN, BSN, [facility]  VBCI Care Management Coordinator  [phone]  Fax: [phone]

## 2022-11-30 LAB — LIPID PANEL
Chol/HDL Ratio: 2.8 {ratio} (ref 0.0–4.4)
Cholesterol, Total: 158 mg/dL (ref 100–199)
HDL: 57 mg/dL (ref >39)
LDL Chol Calc (NIH): 80 mg/dL (ref 0–99)
Triglycerides: 118 mg/dL (ref 0–149)
VLDL Cholesterol Cal: 21 mg/dL (ref 5–40)

## 2022-12-12 ENCOUNTER — Telehealth: Payer: Self-pay | Admitting: Family Medicine

## 2022-12-17 ENCOUNTER — Ambulatory Visit (INDEPENDENT_AMBULATORY_CARE_PROVIDER_SITE_OTHER): Payer: Medicare Other

## 2022-12-17 DIAGNOSIS — E1169 Type 2 diabetes mellitus with other specified complication: Secondary | ICD-10-CM

## 2022-12-17 DIAGNOSIS — E785 Hyperlipidemia, unspecified: Secondary | ICD-10-CM

## 2022-12-17 LAB — HM DIABETES EYE EXAM

## 2022-12-17 NOTE — Progress Notes (Signed)
Wendy Gilbert arrived 12/17/2022 and has given verbal consent to obtain images and complete their overdue diabetic retinal screening.  The images have been sent to an ophthalmologist or optometrist for review and interpretation.  Results will be sent back to Raliegh Ip, DO for review.  Patient has been informed they will be contacted when we receive the results via telephone or MyChart

## 2023-01-23 DIAGNOSIS — L84 Corns and callosities: Secondary | ICD-10-CM | POA: Diagnosis not present

## 2023-01-23 DIAGNOSIS — M79676 Pain in unspecified toe(s): Secondary | ICD-10-CM | POA: Diagnosis not present

## 2023-01-23 DIAGNOSIS — B351 Tinea unguium: Secondary | ICD-10-CM | POA: Diagnosis not present

## 2023-01-23 DIAGNOSIS — E1142 Type 2 diabetes mellitus with diabetic polyneuropathy: Secondary | ICD-10-CM | POA: Diagnosis not present

## 2023-01-29 ENCOUNTER — Encounter: Payer: Self-pay | Admitting: *Deleted

## 2023-01-29 ENCOUNTER — Ambulatory Visit: Payer: Self-pay | Admitting: *Deleted

## 2023-01-29 DIAGNOSIS — Z853 Personal history of malignant neoplasm of breast: Secondary | ICD-10-CM

## 2023-01-29 DIAGNOSIS — I152 Hypertension secondary to endocrine disorders: Secondary | ICD-10-CM

## 2023-01-29 DIAGNOSIS — E785 Hyperlipidemia, unspecified: Secondary | ICD-10-CM

## 2023-01-29 DIAGNOSIS — E119 Type 2 diabetes mellitus without complications: Secondary | ICD-10-CM

## 2023-01-29 NOTE — Patient Outreach (Signed)
Care Coordination   Follow Up Visit Note   01/29/2023 Name: Wendy Gilbert MRN: [id number] DOB: 1960-02-17  Wendy Gilbert is a 62 y.o. year old female who sees Raliegh Ip, DO for primary care. I spoke with  Wendy Gilbert by phone today.  What matters to the patients health and wellness today?  Response to new medicines/Wellbutrin & Celebrex, Hyperlipidemia, Tick bites    Wellbutrin & Celebrex- not seeing any positive changes after starting these medicines in the last 1-2 months Thinks Wellbutrin dose may be too low Thinks Advil was helping more than Celebrex  Hyperlipidemia- inquired if she has to stay on medicine if lab values are within normal limits Shoulder, joint pain  Ticks still seeing ticks a few weeks ago.    Goals Addressed             This Visit's Progress    Home management of medicine/side effects, diabetes, hypertension, insect bites, utilities (care coordination)   On track    Diabetes controlled with diet Tick bites- had further bites but no burying 01/29/23  Interventions Today    Flowsheet Row Most Recent Value  Chronic Disease   Chronic disease during today's visit Other  General Interventions   General Interventions Discussed/Reviewed General Interventions Reviewed, Doctor Visits, Communication with, Lipid Profile  Doctor Visits Discussed/Reviewed Doctor Visits Reviewed, PCP, Specialist  PCP/Specialist Visits Compliance with follow-up visit  Exercise Interventions   Exercise Discussed/Reviewed Exercise Reviewed, Physical Activity  Physical Activity Discussed/Reviewed Physical Activity Discussed, Types of exercise  Education Interventions   Education Provided Provided Web-based Education, Provided Education  [triglyceride diet/ triglyceride education]  Provided Verbal Education On Labs, Medication, Exercise  Labs Reviewed Lipid Profile  Mental Health Interventions   Mental Health Discussed/Reviewed Mental Health Reviewed, Coping Strategies   Nutrition Interventions   Nutrition Discussed/Reviewed Nutrition Discussed, Fluid intake, Decreasing fats  Pharmacy Interventions   Pharmacy Dicussed/Reviewed Pharmacy Topics Reviewed, Affording Medications, Medications and their functions        Interventions Today    Flowsheet Row Most Recent Value  Chronic Disease   Chronic disease during today's visit Other  General Interventions   General Interventions Discussed/Reviewed General Interventions Reviewed, Doctor Visits, Communication with, Lipid Profile  Doctor Visits Discussed/Reviewed Doctor Visits Reviewed, PCP, Specialist  PCP/Specialist Visits Compliance with follow-up visit  Exercise Interventions   Exercise Discussed/Reviewed Exercise Reviewed, Physical Activity  Physical Activity Discussed/Reviewed Physical Activity Discussed, Types of exercise  Education Interventions   Education Provided Provided Web-based Education, Provided Education  [triglyceride diet/ triglyceride education]  Provided Verbal Education On Labs, Medication, Exercise  Labs Reviewed Lipid Profile  Mental Health Interventions   Mental Health Discussed/Reviewed Mental Health Reviewed, Coping Strategies  Nutrition Interventions   Nutrition Discussed/Reviewed Nutrition Discussed, Fluid intake, Decreasing fats  Pharmacy Interventions   Pharmacy Dicussed/Reviewed Pharmacy Topics Reviewed, Affording Medications, Medications and their functions      Interventions Today    Flowsheet Row Most Recent Value  Chronic Disease   Chronic disease during today's visit Other  General Interventions   General Interventions Discussed/Reviewed General Interventions Reviewed, Doctor Visits, Communication with, Lipid Profile  Doctor Visits Discussed/Reviewed Doctor Visits Reviewed, PCP, Specialist  PCP/Specialist Visits Compliance with follow-up visit  Exercise Interventions   Exercise Discussed/Reviewed Exercise Reviewed, Physical Activity  Physical Activity  Discussed/Reviewed Physical Activity Discussed, Types of exercise  Education Interventions   Education Provided Provided Web-based Education, Provided Education  [triglyceride diet/ triglyceride education]  Provided Verbal Education On Labs, Medication, Exercise  Labs  Reviewed Lipid Profile  Mental Health Interventions   Mental Health Discussed/Reviewed Mental Health Reviewed, Coping Strategies  Nutrition Interventions   Nutrition Discussed/Reviewed Nutrition Discussed, Fluid intake, Decreasing fats  Pharmacy Interventions   Pharmacy Dicussed/Reviewed Pharmacy Topics Reviewed, Affording Medications, Medications and their functions              SDOH assessments and interventions completed:  No     Care Coordination Interventions:  Yes, provided   Follow up plan: Follow up call scheduled for 02/26/22    Encounter Outcome:  Patient Visit Completed   Wendy Bradford L. Noelle Penner, RN, BSN, [facility]  VBCI Care Management Coordinator  [phone]  Fax: [phone]

## 2023-01-29 NOTE — Patient Instructions (Addendum)
Visit Information  Thank you for taking time to visit with me today. Please don't hesitate to contact me if I can be of assistance to you.   Following are the goals we discussed today:   Goals Addressed             This Visit's Progress    Home management of medicine/side effects, diabetes, hypertension, insect bites, utilities (care coordination)   On track    Diabetes controlled with diet Tick bites- had further bites but no burying 01/29/23  Interventions Today    Flowsheet Row Most Recent Value  Chronic Disease   Chronic disease during today's visit Other  General Interventions   General Interventions Discussed/Reviewed General Interventions Reviewed, Doctor Visits, Communication with, Lipid Profile  Doctor Visits Discussed/Reviewed Doctor Visits Reviewed, PCP, Specialist  PCP/Specialist Visits Compliance with follow-up visit  Exercise Interventions   Exercise Discussed/Reviewed Exercise Reviewed, Physical Activity  Physical Activity Discussed/Reviewed Physical Activity Discussed, Types of exercise  Education Interventions   Education Provided Provided Web-based Education, Provided Education  [triglyceride diet/ triglyceride education]  Provided Verbal Education On Labs, Medication, Exercise  Labs Reviewed Lipid Profile  Mental Health Interventions   Mental Health Discussed/Reviewed Mental Health Reviewed, Coping Strategies  Nutrition Interventions   Nutrition Discussed/Reviewed Nutrition Discussed, Fluid intake, Decreasing fats  Pharmacy Interventions   Pharmacy Dicussed/Reviewed Pharmacy Topics Reviewed, Affording Medications, Medications and their functions        Interventions Today    Flowsheet Row Most Recent Value  Chronic Disease   Chronic disease during today's visit Other  General Interventions   General Interventions Discussed/Reviewed General Interventions Reviewed, Doctor Visits, Communication with, Lipid Profile  Doctor Visits Discussed/Reviewed  Doctor Visits Reviewed, PCP, Specialist  PCP/Specialist Visits Compliance with follow-up visit  Exercise Interventions   Exercise Discussed/Reviewed Exercise Reviewed, Physical Activity  Physical Activity Discussed/Reviewed Physical Activity Discussed, Types of exercise  Education Interventions   Education Provided Provided Web-based Education, Provided Education  [triglyceride diet/ triglyceride education]  Provided Verbal Education On Labs, Medication, Exercise  Labs Reviewed Lipid Profile  Mental Health Interventions   Mental Health Discussed/Reviewed Mental Health Reviewed, Coping Strategies  Nutrition Interventions   Nutrition Discussed/Reviewed Nutrition Discussed, Fluid intake, Decreasing fats  Pharmacy Interventions   Pharmacy Dicussed/Reviewed Pharmacy Topics Reviewed, Affording Medications, Medications and their functions      Interventions Today    Flowsheet Row Most Recent Value  Chronic Disease   Chronic disease during today's visit Other  General Interventions   General Interventions Discussed/Reviewed General Interventions Reviewed, Doctor Visits, Communication with, Lipid Profile  Doctor Visits Discussed/Reviewed Doctor Visits Reviewed, PCP, Specialist  PCP/Specialist Visits Compliance with follow-up visit  Exercise Interventions   Exercise Discussed/Reviewed Exercise Reviewed, Physical Activity  Physical Activity Discussed/Reviewed Physical Activity Discussed, Types of exercise  Education Interventions   Education Provided Provided Web-based Education, Provided Education  [triglyceride diet/ triglyceride education]  Provided Verbal Education On Labs, Medication, Exercise  Labs Reviewed Lipid Profile  Mental Health Interventions   Mental Health Discussed/Reviewed Mental Health Reviewed, Coping Strategies  Nutrition Interventions   Nutrition Discussed/Reviewed Nutrition Discussed, Fluid intake, Decreasing fats  Pharmacy Interventions   Pharmacy Dicussed/Reviewed  Pharmacy Topics Reviewed, Affording Medications, Medications and their functions              Our next appointment is by telephone on 02/27/23 at 2 pm  Please call the care guide team at [phone] if you need to cancel or reschedule your appointment.   If you are experiencing a Mental Health  or Behavioral Health Crisis or need someone to talk to, please call the Suicide and Crisis Lifeline: 988 call the [place] National Suicide Prevention Lifeline: (707)348-4006 or TTY: 681-158-6798 TTY (412)755-1472) to talk to a trained counselor call 1-800-273-TALK (toll free, 24 hour hotline) call the [facility]: (934)307-0963 call 911   Patient verbalizes understanding of instructions and care plan provided today and agrees to view in MyChart. Active MyChart status and patient understanding of how to access instructions and care plan via MyChart confirmed with patient.     The patient has been provided with contact information for the care management team and has been advised to call with any health related questions or concerns.   Dlynn Ranes L. Noelle Penner, RN, BSN, [facility]  VBCI Care Management Coordinator  [phone]  Fax: [phone]

## 2023-01-30 ENCOUNTER — Telehealth: Payer: Self-pay

## 2023-01-30 NOTE — Progress Notes (Unsigned)
Care Guide Pharmacy Note  01/30/2023 Name: Wendy Gilbert MRN: [id number] DOB: 07/21/1960  Referred By: Raliegh Ip, DO Reason for referral: Care Coordination (Outreach to schedule with Pharm d )   Wendy Gilbert is a 62 y.o. year old female who is a primary care patient of Raliegh Ip, DO.  [name] was referred to the pharmacist for assistance related to: HLD  An unsuccessful telephone outreach was attempted today to contact the patient who was referred to the pharmacy team for assistance with medication management. Additional attempts will be made to contact the patient.  Penne Lash , RMA     [facility]  [facility], [facility] Guide  Direct Dial: [phone]  Website: [email or web address]

## 2023-02-07 NOTE — Progress Notes (Unsigned)
Care Guide Pharmacy Note  02/07/2023 Name: Wendy Gilbert MRN: [id number] DOB: 1960/09/27  Referred By: Raliegh Ip, DO Reason for referral: Care Coordination (Outreach to schedule with Pharm d )   Wendy Gilbert is a 62 y.o. year old female who is a primary care patient of Raliegh Ip, DO.  Wendy Gilbert was referred to the pharmacist for assistance related to: HLD  A second unsuccessful telephone outreach was attempted today to contact the patient who was referred to the pharmacy team for assistance with medication management. Additional attempts will be made to contact the patient.  Penne Lash , RMA     [facility]  [facility], [facility] Guide  Direct Dial: [phone]  Website: [email or web address]

## 2023-02-19 NOTE — Progress Notes (Signed)
 Care Guide Pharmacy Note  02/19/2023 Name: Wendy Gilbert MRN: [id number] DOB: 1960-03-14  Referred By: Jolinda Norene HERO, DO Reason for referral: Care Coordination (Outreach to schedule with Pharm d )   Wendy Gilbert is a 63 y.o. year old female who is a primary care patient of Jolinda Norene HERO, DO.  Wendy Gilbert was referred to the pharmacist for assistance related to: HLD  A third unsuccessful telephone outreach was attempted today to contact the patient who was referred to the pharmacy team for assistance with medication management. The Population Health team is pleased to engage with this patient at any time in the future upon receipt of referral and should he/she be interested in assistance from the [facility] team.  Jeoffrey Buffalo , RMA     [facility]  [facility], [facility] Guide  Direct Dial: [phone]  Website: [email or web address]

## 2023-02-27 ENCOUNTER — Ambulatory Visit: Payer: Self-pay | Admitting: *Deleted

## 2023-02-27 NOTE — Patient Outreach (Signed)
  Care Coordination   02/27/2023 Name: Wendy Gilbert MRN: [id number] DOB: 30-Mar-1960   Care Coordination Outreach Attempts:  An unsuccessful outreach was attempted for an appointment today.  Follow Up Plan:  Additional outreach attempts will be made to offer the patient complex care management information and services.   Encounter Outcome:  Patient Visit Completed  Spouse, Iantha Fallen answered He reported patient was not available. He requested RN CM return a call to leave a voice message on the answering services  Care Coordination Interventions:  Yes, provided     Saathvik Every L. Noelle Penner, RN, BSN, CCM [place]  [facility], [facility] RN Care Manager Direct Dial: [phone]  Fax: [phone] Mailing Address: [address]  [place] [address] Website: [email or web address]

## 2023-03-06 ENCOUNTER — Ambulatory Visit: Payer: Medicare Other | Admitting: Family Medicine

## 2023-03-06 ENCOUNTER — Ambulatory Visit (INDEPENDENT_AMBULATORY_CARE_PROVIDER_SITE_OTHER): Payer: Medicare Other

## 2023-03-06 ENCOUNTER — Ambulatory Visit (HOSPITAL_COMMUNITY)
Admission: RE | Admit: 2023-03-06 | Discharge: 2023-03-06 | Disposition: A | Discharge status: Home / Self Care | Payer: Medicare Other | Source: Ambulatory Visit | Attending: Family Medicine | Admitting: Family Medicine

## 2023-03-06 ENCOUNTER — Encounter: Payer: Self-pay | Admitting: Family Medicine

## 2023-03-06 ENCOUNTER — Ambulatory Visit: Payer: Self-pay | Admitting: Family Medicine

## 2023-03-06 VITALS — BP 100/74 | HR 112 | Temp 98.2°F | Ht 64.0 in | Wt 186.0 lb

## 2023-03-06 DIAGNOSIS — K5900 Constipation, unspecified: Secondary | ICD-10-CM

## 2023-03-06 DIAGNOSIS — R1032 Left lower quadrant pain: Secondary | ICD-10-CM | POA: Insufficient documentation

## 2023-03-06 DIAGNOSIS — R000 Tachycardia, unspecified: Secondary | ICD-10-CM

## 2023-03-06 DIAGNOSIS — R0902 Hypoxemia: Secondary | ICD-10-CM

## 2023-03-06 DIAGNOSIS — R509 Fever, unspecified: Secondary | ICD-10-CM | POA: Diagnosis not present

## 2023-03-06 DIAGNOSIS — R109 Unspecified abdominal pain: Secondary | ICD-10-CM | POA: Diagnosis not present

## 2023-03-06 DIAGNOSIS — K8020 Calculus of gallbladder without cholecystitis without obstruction: Secondary | ICD-10-CM | POA: Diagnosis not present

## 2023-03-06 LAB — MICROSCOPIC EXAMINATION
RBC, Urine: NONE SEEN /[HPF] (ref 0–2)
Renal Epithel, UA: NONE SEEN /[HPF]
Yeast, UA: NONE SEEN

## 2023-03-06 LAB — URINALYSIS, ROUTINE W REFLEX MICROSCOPIC
Bilirubin, UA: NEGATIVE
Glucose, UA: NEGATIVE
Ketones, UA: NEGATIVE
Nitrite, UA: NEGATIVE
RBC, UA: NEGATIVE
Specific Gravity, UA: 1.015 (ref 1.005–1.030)
Urobilinogen, Ur: 0.2 mg/dL (ref 0.2–1.0)
pH, UA: 7.5 (ref 5.0–7.5)

## 2023-03-06 LAB — POCT I-STAT CREATININE: Creatinine, Ser: 1.7 mg/dL — ABNORMAL HIGH (ref 0.44–1.00)

## 2023-03-06 MED ORDER — IOHEXOL 300 MG/ML  SOLN
100.0000 mL | Freq: Once | INTRAMUSCULAR | Status: AC | PRN
  Administered 2023-03-06: 100 mL via INTRAVENOUS

## 2023-03-06 NOTE — Patient Instructions (Signed)
Thank you for coming in to clinic today.  1. Your symptoms are consistent with Constipation, likely cause of your General Abdominal Pain / Cramping. 2. Start with Miralax, prescription was sent to pharmacy. First dose 68g (4 capfuls) in 32oz water over 1 to 2 hours for clean out. Next day start 17g or 1 capful daily, may adjust dose up or down by half a capful every few days. Recommend to take this medicine daily for next 1-2 weeks, you may need to use it longer if needed. - Goal is to have soft regular bowel movement 1-3x daily, if too runny or diarrhea, then reduce dose of the medicine to every other day.  Improve water intake, hydration will help Also recommend increased vegetables, fruits, fiber intake Can try daily Metamucil or Fiber supplement at pharmacy over the counter  Follow-up if symptoms are not improving with bowel movements, or if pain worsens, develop fevers, nausea, vomiting.  Please schedule a follow-up appointment in 1 month to follow-up Constipation  If you have any other questions or concerns, please feel free to call the clinic to contact me. You may also schedule an earlier appointment if necessary.  However, if your symptoms get significantly worse, please go to the Emergency Department to seek immediate medical attention.

## 2023-03-06 NOTE — Telephone Encounter (Signed)
  Chief Complaint: constipation Symptoms: constipation, nausea Frequency: x 2 weeks Pertinent Negatives: Patient denies fever, vomiting Disposition: [] ED /[] Urgent Care (no appt availability in office) / [x] Appointment(In office/virtual)/ []  [place] [facility]/ [] Home Care/ [] Refused Recommended Disposition /[] [facility]/ []  Follow-up with PCP Additional Notes: Patient reports she has been experiencing constipation x 2 weeks. Patient reports she has had issues with constipation in the past but never this bad. Patient reports she has tried enemas and stool softeners with no relief. Per protocol, patient scheduled for in office visit today 1/23. Patient advised to call back with worsening symptoms. Patient verbalized understanding.      Copied from CRM [id number]. Topic: Clinical - Red Word Triage >> Mar 06, 2023 11:20 AM Wendy Gilbert wrote: Red Word that prompted transfer to Nurse Triage: Fever of 100.1 and constipation. Tried an enema and that didn't. Doctor gave samples of Linzess to patient but that did not help as well. Stomach very upset and patient is extremely fatigued. Reason for Disposition  [1] Rectal pain or fullness from fecal impaction (rectum full of stool) AND [2] NOT better after SITZ bath, suppository or enema  Answer Assessment - Initial Assessment Questions 1. STOOL PATTERN OR FREQUENCY: "How often do you have a bowel movement (BM)?"  (Normal range: 3 times a day to every 3 days)  "When was your last BM?"       Last BM was 2 weeks ago, but usually at least once a day 2. STRAINING: "Do you have to strain to have a BM?"      yes 3. RECTAL PAIN: "Does your rectum hurt when the stool comes out?" If Yes, ask: "Do you have hemorrhoids? How bad is the pain?"  (Scale 1-10; or mild, moderate, severe)     none 4. STOOL COMPOSITION: "Are the stools hard?"      none 5. BLOOD ON STOOLS: "Has there been any blood on the toilet tissue or on the surface of the BM?" If Yes, ask:  "When was the last time?"     none 6. CHRONIC CONSTIPATION: "Is this a new problem for you?"  If No, ask: "How long have you had this problem?" (days, weeks, months)      none 7. CHANGES IN DIET OR HYDRATION: "Have there been any recent changes in your diet?" "How much fluids are you drinking on a daily basis?"  "How much have you had to drink today?"     none 8. MEDICINES: "Have you been taking any new medicines?" "Are you taking any narcotic pain medicines?" (e.g., Dilaudid, morphine, Percocet, Vicodin)     none 9. LAXATIVES: "Have you been using any stool softeners, laxatives, or enemas?"  If Yes, ask "What, how often, and when was the last time?"     Enemas, stool softener 10. ACTIVITY:  "How much walking do you do every day?"  "Has your activity level decreased in the past week?"        none 11. CAUSE: "What do you think is causing the constipation?"        I'm not sure if there's a blockage 12. OTHER SYMPTOMS: "Do you have any other symptoms?" (e.g., abdomen pain, bloating, fever, vomiting)       Nauseous, abdominal pain 13. MEDICAL HISTORY: "Do you have a history of hemorrhoids, rectal fissures, or rectal surgery or rectal abscess?"         none  Protocols used: Constipation-A-AH

## 2023-03-06 NOTE — Progress Notes (Signed)
Results discussed with office with patient.

## 2023-03-06 NOTE — Progress Notes (Signed)
Subjective:  Patient ID: Wendy Gilbert, female    DOB: 04-Jul-1960, 63 y.o.   MRN: [id number]  Patient Care Team: Raliegh Ip, DO as PCP - General (Family Medicine) Margaretmary Dys, MD as Consulting Physician (Radiation Oncology) Doreatha Massed, MD as Consulting Physician (Hematology) Lanelle Bal, DO as Consulting Physician (Gastroenterology) Randa Lynn, MD as Consulting Physician (Nephrology) Clinton Gallant, RN as [facility] Care Management   Chief Complaint:  Constipation (X 2 weeks)  HPI: Wendy Gilbert is a 63 y.o. female presenting on 03/06/2023 for Constipation (X 2 weeks)  States that it started with constipation about 2 weeks ago. States that she is nauseous, in pain. States that she had a small BM on Friday, but was not able to clear everything. She has tried linzess and miralax 4 times over the weekend. Completed enema last night and only water came out.  Endorses fever of 100.1. states that she is not passing gas like she normally does. Reports that it is more rare. States that she has intense abdominal pain. She denies urinary symptoms.   Relevant past medical, surgical, family, and social history reviewed and updated as indicated.  Allergies and medications reviewed and updated. Data reviewed: Chart in Epic.   Past Medical History:  Diagnosis Date   Arthritis    "all over"   Diabetes mellitus without complication (HCC)    DVT (deep venous thrombosis) (HCC) 10/2008   left arm   GERD (gastroesophageal reflux disease)    Headache(784.0)    sinus   Inflammatory carcinoma of right breast dx'd 07/2008   PONV (postoperative nausea and vomiting)    Seasonal allergies    Sinusitis    Vitamin D deficiency 04/13/2014    Past Surgical History:  Procedure Laterality Date   BIOPSY  12/13/2019   Procedure: BIOPSY;  Surgeon: Lanelle Bal, DO;  [facility];  Service: Endoscopy;;  gastric polyps   CESAREAN SECTION   09/1999   COLONOSCOPY WITH PROPOFOL N/A 12/13/2019   Procedure: COLONOSCOPY WITH PROPOFOL;  Surgeon: Lanelle Bal, DO;  Location: [facility];  Service: Endoscopy;  Laterality: N/A;  12:30pm- patient will arrive as soon as she can.   DILATION AND CURETTAGE OF UTERUS  1978   ESOPHAGOGASTRODUODENOSCOPY (EGD) WITH PROPOFOL N/A 12/13/2019   Procedure: ESOPHAGOGASTRODUODENOSCOPY (EGD) WITH PROPOFOL;  Surgeon: Lanelle Bal, DO;  [facility];  Service: Endoscopy;  Laterality: N/A;   INCISION AND DRAINAGE ABSCESS  10/25/2008   and debridement left axillary abscess, abd. wall abscess, left thigh abscess   KNEE ARTHROSCOPY Left 11/1996   MASTECTOMY Bilateral 02/23/2009   right modified radical, left simple   MASTECTOMY  2011    bilateral    POLYPECTOMY  12/13/2019   Procedure: POLYPECTOMY;  Surgeon: Lanelle Bal, DO;  [facility]: [facility];  Service: Endoscopy;;   PORT-A-CATH REMOVAL Left 05/07/2012   Procedure: REMOVAL PORT-A-CATH;  Surgeon: Adolph Pollack, MD;  Location: [facility];  Service: General;  Laterality: Left;   PORTACATH PLACEMENT  08/09/2008   TOTAL KNEE ARTHROPLASTY Left 08/10/2013   Procedure: LEFT TOTAL KNEE ARTHROPLASTY;  Surgeon: Jacki Cones, MD;  Location: [facility];  Service: Orthopedics;  Laterality: Left;   TUBAL LIGATION      Social History   Socioeconomic History   Marital status: Married    Spouse name: Pecola Leisure   Number of children: 2   Years of education: 26  Highest education level: Some college, no degree  Occupational History   Occupation: disabled  Tobacco Use   Smoking status: Never   Smokeless tobacco: Never  Vaping Use   Vaping status: Never Used  Substance and Sexual Activity   Alcohol use: No   Drug use: No   Sexual activity: Not Currently    Birth control/protection: Surgical, Post-menopausal    Comment: tubal  Other Topics Concern   Not on file  Social History Narrative   One daughter lives with them    Social Drivers of Health   Financial Resource Strain: Medium Risk (09/11/2022)   Overall Financial Resource Strain (CARDIA)    Difficulty of Paying Living Expenses: Somewhat hard  Food Insecurity: No Food Insecurity (10/25/2022)   Hunger Vital Sign    Worried About Running Out of Food in the Last Year: Never true    Ran Out of Food in the Last Year: Never true  Transportation Needs: No Transportation Needs (10/25/2022)   PRAPARE - Administrator, Civil Service (Medical): No    Lack of Transportation (Non-Medical): No  Physical Activity: Inactive (09/11/2022)   Exercise Vital Sign    Days of Exercise per Week: 0 days    Minutes of Exercise per Session: 0 min  Stress: No Stress Concern Present (09/11/2022)   [employer] of Occupational Health - Occupational Stress Questionnaire    Feeling of Stress : Only a little  Social Connections: Moderately Isolated (09/11/2022)   Social Connection and Isolation Panel [NHANES]    Frequency of Communication with Friends and Family: Twice a week    Frequency of Social Gatherings with Friends and Family: Once a week    Attends Religious Services: Never    Database administrator or Organizations: No    Attends Banker Meetings: Never    Marital Status: Married  Catering manager Violence: Not At Risk (09/11/2022)   Humiliation, Afraid, Rape, and Kick questionnaire    Fear of Current or Ex-Partner: No    Emotionally Abused: No    Physically Abused: No    Sexually Abused: No    Outpatient Encounter Medications as of 03/06/2023  Medication Sig   acetaminophen (TYLENOL) 650 MG CR tablet Take 1,300 mg by mouth at bedtime.   buPROPion ER (WELLBUTRIN SR) 100 MG 12 hr tablet Take 1 tablet (100 mg total) by mouth in the morning.   celecoxib (CELEBREX) 50 MG capsule Take 1 capsule (50 mg total) by mouth 2 (two) times daily as needed for pain (take with food).   doxycycline (VIBRA-TABS) 100 MG tablet Take 1 tablet (100 mg total)  by mouth 2 (two) times daily.   fluticasone (FLONASE) 50 MCG/ACT nasal spray Place 2 sprays into both nostrils daily.   hydrOXYzine (VISTARIL) 25 MG capsule Take 1 capsule (25 mg total) by mouth every 8 (eight) hours as needed.   Loratadine (CLARITIN PO) Take by mouth.   mupirocin ointment (BACTROBAN) 2 % Apply 1 Application topically 3 (three) times daily.   pantoprazole (PROTONIX) 40 MG tablet TAKE 1 TABLET BY MOUTH DAILY. MAY TAKE AN EXTRA TABLET IN THE EVENING IF NEEDED.   pravastatin (PRAVACHOL) 40 MG tablet Take 1 tablet (40 mg total) by mouth daily. For cholesterol. To replace the Rosuvastatin   triamterene-hydrochlorothiazide (MAXZIDE) 75-50 MG tablet Take 1 tablet by mouth daily.   No facility-administered encounter medications on file as of 03/06/2023.    Allergies  Allergen Reactions   Demerol Other (See Comments)  MENTAL STATUS CHANGE   Erythromycin Nausea And Vomiting   Adhesive [Tape] Rash   Cephalexin Hives and Other (See Comments)    ABD. PAIN   Penicillins Other (See Comments)    UNKNOWN - WAS AS A CHILD    Review of Systems As per HPI  Objective:  BP 100/74   Pulse (!) 112   Temp 98.2 F (36.8 C)   Ht 5\' 4"  (1.626 m)   Wt 186 lb (84.4 kg)   SpO2 91%   BMI 31.93 kg/m    Wt Readings from Last 3 Encounters:  11/27/22 201 lb (91.2 kg)  10/10/22 199 lb 12.8 oz (90.6 kg)  09/17/22 202 lb 12.8 oz (92 kg)   Physical Exam Constitutional:      General: She is awake. She is not in acute distress.    Appearance: Normal appearance. She is well-developed and well-groomed. She is ill-appearing. She is not toxic-appearing or diaphoretic.  Cardiovascular:     Rate and Rhythm: Regular rhythm. Tachycardia present.     Pulses: Normal pulses.          Radial pulses are 2+ on the right side and 2+ on the left side.       Posterior tibial pulses are 2+ on the right side and 2+ on the left side.     Heart sounds: Normal heart sounds. No murmur heard.    No gallop.   Pulmonary:     Effort: Pulmonary effort is normal. No respiratory distress.     Breath sounds: Normal breath sounds. No stridor. No wheezing, rhonchi or rales.  Abdominal:     General: Bowel sounds are normal. There is distension.     Palpations: There is no shifting dullness, fluid wave, hepatomegaly, splenomegaly, mass or pulsatile mass.     Tenderness: There is abdominal tenderness in the left upper quadrant and left lower quadrant. There is no right CVA tenderness, left CVA tenderness, guarding or rebound. Negative signs include Murphy's sign, Rovsing's sign, McBurney's sign and psoas sign.     Hernia: No hernia is present.  Musculoskeletal:     Cervical back: Full passive range of motion without pain and neck supple.     Right lower leg: No edema.     Left lower leg: No edema.  Skin:    General: Skin is warm.     Capillary Refill: Capillary refill takes less than 2 seconds.  Neurological:     General: No focal deficit present.     Mental Status: She is alert, oriented to person, place, and time and easily aroused. Mental status is at baseline.     GCS: GCS eye subscore is 4. GCS verbal subscore is 5. GCS motor subscore is 6.     Motor: No weakness.  Psychiatric:        Attention and Perception: Attention and perception normal.        Mood and Affect: Mood and affect normal.        Speech: Speech normal.        Behavior: Behavior normal. Behavior is cooperative.        Thought Content: Thought content normal. Thought content does not include homicidal or suicidal ideation. Thought content does not include homicidal or suicidal plan.        Cognition and Memory: Cognition and memory normal.        Judgment: Judgment normal.     Results for orders placed or performed in visit on 12/25/22  HM DIABETES  EYE EXAM   Collection Time: 12/17/22 12:00 AM  Result Value Ref Range   HM Diabetic Eye Exam No Retinopathy No Retinopathy       11/27/2022    9:18 AM 10/25/2022    2:00 PM  09/11/2022    1:51 PM 07/05/2022    2:24 PM 11/23/2021    4:27 PM  Depression screen PHQ 2/9  Decreased Interest 0 0 0 0 0  Down, Depressed, Hopeless 0 0 0 0 0  PHQ - 2 Score 0 0 0 0 0  Altered sleeping 1  1    Tired, decreased energy 1  2    Change in appetite 0  0    Feeling bad or failure about yourself  0  0    Trouble concentrating 0  0    Moving slowly or fidgety/restless 0  0    Suicidal thoughts 0  0    PHQ-9 Score 2  3    Difficult doing work/chores Not difficult at all           11/27/2022    9:19 AM 09/11/2022    1:52 PM 11/23/2021    4:26 PM 08/22/2021    3:56 PM  GAD 7 : Generalized Anxiety Score  Nervous, Anxious, on Edge 0 0 0 0  Control/stop worrying 0 0 0 0  Worry too much - different things 0 0 0 0  Trouble relaxing 0 0 0 0  Restless 0 0 0 0  Easily annoyed or irritable 0 0 0 0  Afraid - awful might happen 0 0 0 0  Total GAD 7 Score 0 0 0 0  Anxiety Difficulty   Somewhat difficult Not difficult at all   Pertinent labs & imaging results that were available during my care of the patient were reviewed by me and considered in my medical decision making.  Assessment & Plan:  Love was seen today for constipation.  Diagnoses and all orders for this visit:  Constipation, unspecified constipation type No ileus or obstruction noted on imaging. Communicated results to patient. However, given symptoms of low grade fever, tachycardia, and hypoxia, concerned for possible diverticulitis or other processes causing systemic symptoms. Would like for patient to complete CT for further evaluation. Will rule out UTI as below.  -     DG Abd 1 View  Left lower quadrant abdominal pain As above.  -     Cancel: CT ABDOMEN PELVIS W WO CONTRAST; Future -     Urinalysis, Routine w reflex microscopic -     Urine Culture -     CT ABDOMEN PELVIS W CONTRAST; Future  Tachycardia As above.   Hypoxia As above.    Continue all other maintenance medications.  Follow up  plan: Return if symptoms worsen or fail to improve.   Continue healthy lifestyle choices, including diet (rich in fruits, vegetables, and lean proteins, and low in salt and simple carbohydrates) and exercise (at least 30 minutes of moderate physical activity daily).  Written and verbal instructions provided   The above assessment and management plan was discussed with the patient. The patient verbalized understanding of and has agreed to the management plan. Patient is aware to call the clinic if they develop any new symptoms or if symptoms persist or worsen. Patient is aware when to return to the clinic for a follow-up visit. Patient educated on when it is appropriate to go to the emergency department.   Neale Burly, DNP-FNP [facility]  [address] [place], [place] [address] [phone]

## 2023-03-07 ENCOUNTER — Encounter: Payer: Self-pay | Admitting: Family Medicine

## 2023-03-07 LAB — URINE CULTURE

## 2023-03-07 NOTE — Progress Notes (Signed)
Gallstones present, but no acute cholecystitis. Large stool burden, recommend that patient complete bowel cleanout that was provided yesterday. Would like for her to follow up in one week to monitor. If she has fever, increased work of breathing, increased HR, please come back in!

## 2023-03-10 ENCOUNTER — Ambulatory Visit: Payer: Self-pay | Admitting: Family Medicine

## 2023-03-10 NOTE — Telephone Encounter (Signed)
  Chief Complaint: Blood in stool Symptoms: Blood in stool/urine Frequency: Ongoing this weekend Pertinent Negatives: Patient denies fever, vomiting, LBP Disposition: [] ED /[] Urgent Care (no appt availability in office) / [x] Appointment(In office/virtual)/ []  [place] [facility]/ [] Home Care/ [] Refused Recommended Disposition /[] [place] [facility]/ []  Follow-up with PCP Additional Notes: Pt reports she has been experiencing low abd pain since the start of the weekend. She reports seeing blood in her urine and stool. She reports her urine was recently tested and she was constipated and has been straining to pass stool. Pt reports the pain comes and goes. Pt has OV already scheduled for tomorrow AM, to keep appt. This RN educated pt on home care, new-worsening symptoms, when to call back/seek emergent care. Pt verbalized understanding and agrees to plan.    Copied from CRM [id number]. Topic: Clinical - Red Word Triage >> Mar 10, 2023  5:23 PM Antony Haste wrote: Red Word that prompted transfer to Nurse Triage: Blood in stool, lower abdomen/gut pain. Blood when she wipes. Reason for Disposition  [1] MODERATE pain (e.g., interferes with normal activities) AND [2] pain comes and goes (cramps) AND [3] present > 24 hours  (Exception: Pain with Vomiting or Diarrhea - see that Guideline.)  Answer Assessment - Initial Assessment Questions 1. LOCATION: "Where does it hurt?"      From belly button down, all the way across 2. RADIATION: "Does the pain shoot anywhere else?" (e.g., chest, back)     None 3. ONSET: "When did the pain begin?" (e.g., minutes, hours or days ago)      All weekend 4. SUDDEN: "Gradual or sudden onset?"     Gradual 5. PATTERN "Does the pain come and go, or is it constant?"    - If it comes and goes: "How long does it last?" "Do you have pain now?"     (Note: Comes and goes means the pain is intermittent. It goes away completely between bouts.)    - If constant: "Is it getting  better, staying the same, or getting worse?"      (Note: Constant means the pain never goes away completely; most serious pain is constant and gets worse.)      Comes and goes 6. SEVERITY: "How bad is the pain?"  (e.g., Scale 1-10; mild, moderate, or severe)    - MILD (1-3): Doesn't interfere with normal activities, abdomen soft and not tender to touch.     - MODERATE (4-7): Interferes with normal activities or awakens from sleep, abdomen tender to touch.     - SEVERE (8-10): Excruciating pain, doubled over, unable to do any normal activities.       7/10  9. RELIEVING/AGGRAVATING FACTORS: "What makes it better or worse?" (e.g., antacids, bending or twisting motion, bowel movement)     None 10. OTHER SYMPTOMS: "Do you have any other symptoms?" (e.g., back pain, diarrhea, fever, urination pain, vomiting)       Nausea  Protocols used: Abdominal Pain - Female-A-AH

## 2023-03-11 ENCOUNTER — Encounter: Payer: Self-pay | Admitting: Family

## 2023-03-11 ENCOUNTER — Ambulatory Visit (INDEPENDENT_AMBULATORY_CARE_PROVIDER_SITE_OTHER): Payer: Medicare Other | Admitting: Family

## 2023-03-11 ENCOUNTER — Encounter: Payer: Self-pay | Admitting: Family Medicine

## 2023-03-11 ENCOUNTER — Ambulatory Visit: Payer: Medicare Other | Admitting: Family Medicine

## 2023-03-11 VITALS — BP 126/89 | HR 109 | Temp 98.2°F | Ht 64.0 in | Wt 180.8 lb

## 2023-03-11 DIAGNOSIS — N3000 Acute cystitis without hematuria: Secondary | ICD-10-CM

## 2023-03-11 DIAGNOSIS — R300 Dysuria: Secondary | ICD-10-CM | POA: Diagnosis not present

## 2023-03-11 DIAGNOSIS — R350 Frequency of micturition: Secondary | ICD-10-CM | POA: Diagnosis not present

## 2023-03-11 DIAGNOSIS — K5900 Constipation, unspecified: Secondary | ICD-10-CM | POA: Diagnosis not present

## 2023-03-11 LAB — URINALYSIS, COMPLETE
Bilirubin, UA: NEGATIVE
Glucose, UA: NEGATIVE
Nitrite, UA: NEGATIVE
Specific Gravity, UA: 1.02 (ref 1.005–1.030)
Urobilinogen, Ur: 0.2 mg/dL (ref 0.2–1.0)
pH, UA: 7 (ref 5.0–7.5)

## 2023-03-11 LAB — MICROSCOPIC EXAMINATION
Renal Epithel, UA: NONE SEEN /[HPF]
WBC, UA: >30 /[HPF] — AB (ref 0–5)
Yeast, UA: NONE SEEN

## 2023-03-11 MED ORDER — SULFAMETHOXAZOLE-TRIMETHOPRIM 800-160 MG PO TABS: 1.0000 | ORAL_TABLET | Freq: Two times a day (BID) | ORAL | 0 refills | Status: DC

## 2023-03-11 NOTE — Progress Notes (Signed)
Subjective:    Patient ID: Wendy Gilbert, female    DOB: 02-Jan-1961, 63 y.o.   MRN: [id number]  Chief Complaint  Patient presents with   Constipation   Hemorrhoids   Abdominal Pain    lower   PT presents to the office today with abdominal pain. She reports she has had constipation for the last two weeks and has been taking miralax. Has had small BM, but today has several large BM. Reports she continues to have abdominal pain and hematuria.  Constipation This is a new problem. The current episode started more than 1 month ago. The problem is unchanged. Associated symptoms include abdominal pain and nausea. Pertinent negatives include no vomiting. The treatment provided mild relief.  Abdominal Pain This is a new problem. The current episode started in the past 7 days. The onset quality is gradual. The pain is located in the generalized abdominal region. The pain is mild. Associated symptoms include constipation, hematuria and nausea. Pertinent negatives include no dysuria, frequency or vomiting.      Review of Systems  Gastrointestinal:  Positive for abdominal pain, constipation and nausea. Negative for vomiting.  Genitourinary:  Positive for hematuria. Negative for dysuria and frequency.  All other systems reviewed and are negative.   Social History   Socioeconomic History   Marital status: Married    Spouse name: Pecola Leisure   Number of children: 2   Years of education: 12   Highest education level: Some college, no degree  Occupational History   Occupation: disabled  Tobacco Use   Smoking status: Never   Smokeless tobacco: Never  Vaping Use   Vaping status: Never Used  Substance and Sexual Activity   Alcohol use: No   Drug use: No   Sexual activity: Not Currently    Birth control/protection: Surgical, Post-menopausal    Comment: tubal  Other Topics Concern   Not on file  Social History Narrative   One daughter lives with them   Social Drivers of Health   Financial  Resource Strain: Medium Risk (09/11/2022)   Overall Financial Resource Strain (CARDIA)    Difficulty of Paying Living Expenses: Somewhat hard  Food Insecurity: No Food Insecurity (10/25/2022)   Hunger Vital Sign    Worried About Running Out of Food in the Last Year: Never true    Ran Out of Food in the Last Year: Never true  Transportation Needs: No Transportation Needs (10/25/2022)   PRAPARE - Administrator, Civil Service (Medical): No    Lack of Transportation (Non-Medical): No  Physical Activity: Inactive (09/11/2022)   Exercise Vital Sign    Days of Exercise per Week: 0 days    Minutes of Exercise per Session: 0 min  Stress: No Stress Concern Present (09/11/2022)   [employer] of Occupational Health - Occupational Stress Questionnaire    Feeling of Stress : Only a little  Social Connections: Moderately Isolated (09/11/2022)   Social Connection and Isolation Panel [NHANES]    Frequency of Communication with Friends and Family: Twice a week    Frequency of Social Gatherings with Friends and Family: Once a week    Attends Religious Services: Never    Database administrator or Organizations: No    Attends Banker Meetings: Never    Marital Status: Married   Family History  Problem Relation Age of Onset   Breast cancer Mother    Cancer Mother        breast, colon, ovarian  Heart disease Father    Cancer Paternal Uncle        pancreatic   Deep vein thrombosis Brother    Aortic aneurysm Brother    ADD / ADHD Daughter    Asthma Daughter    Diabetes Maternal Grandmother    Stroke Maternal Grandmother    Hypothyroidism Maternal Grandfather    Heart disease Maternal Grandfather    Anuerysm Paternal Grandmother    Heart attack Paternal Grandfather         Objective:   Physical Exam Vitals reviewed.  Constitutional:      General: She is not in acute distress.    Appearance: She is well-developed. She is obese.  HENT:     Head: Normocephalic and  atraumatic.     Right Ear: Tympanic membrane normal.     Left Ear: Tympanic membrane normal.  Eyes:     Pupils: Pupils are equal, round, and reactive to light.  Neck:     Thyroid: No thyromegaly.  Cardiovascular:     Rate and Rhythm: Normal rate and regular rhythm.     Heart sounds: Normal heart sounds. No murmur heard. Pulmonary:     Effort: Pulmonary effort is normal. No respiratory distress.     Breath sounds: Normal breath sounds. No wheezing.  Abdominal:     General: Bowel sounds are normal. There is no distension.     Palpations: Abdomen is soft.     Tenderness: There is abdominal tenderness.  Musculoskeletal:        General: No tenderness. Normal range of motion.     Cervical back: Normal range of motion and neck supple.  Skin:    General: Skin is warm and dry.  Neurological:     Mental Status: She is alert and oriented to person, place, and time.     Cranial Nerves: No cranial nerve deficit.     Deep Tendon Reflexes: Reflexes are normal and symmetric.  Psychiatric:        Behavior: Behavior normal.        Thought Content: Thought content normal.        Judgment: Judgment normal.       BP 126/89   Pulse (!) 109   Temp 98.2 F (36.8 C)   Ht 5\' 4"  (1.626 m)   Wt 180 lb 12.8 oz (82 kg)   SpO2 92%   BMI 31.03 kg/m      Assessment & Plan:  Wendy Gilbert comes in today with chief complaint of Constipation, Hemorrhoids, and Abdominal Pain (lower)   Diagnosis and orders addressed:  1. Urinary frequency (Primary) - Urinalysis, Complete - Urine Culture  2. Acute cystitis without hematuria Force fluids AZO over the counter X2 days RTO prn Culture pending Follow up if symptoms worsen or do not improve  - sulfamethoxazole-trimethoprim (BACTRIM DS) 800-160 MG tablet; Take 1 tablet by mouth 2 (two) times daily.  Dispense: 14 tablet; Refill: 0   3. Constipation, unspecified constipation type Continue Miralax    Jannifer Rodney, FNP

## 2023-03-11 NOTE — Patient Instructions (Signed)

## 2023-03-11 NOTE — Progress Notes (Signed)
Negative for UTI. Recommend patient complete bowel cleanout. If she is still having symptoms, please follow up.

## 2023-03-11 NOTE — Progress Notes (Deleted)
Subjective: CC:f/u shoulder pain, wellbutrin PCP: Raliegh Ip, DO ZOX:[id number] C Hollingshed is a 63 y.o. female presenting to clinic today for:  1. Shoulder pain Started on Celebrex last visit. ***  2. Chronic fatigue Wellbutrin started last visit ***  3. Constipation Seen by my partner. Concerned for diverticulitis.  CT obtained but only revealed large stool burden that was refractory to Miralax and Linzess. ***   ROS: Per HPI  Allergies  Allergen Reactions   Demerol Other (See Comments)    MENTAL STATUS CHANGE   Erythromycin Nausea And Vomiting   Adhesive [Tape] Rash   Cephalexin Hives and Other (See Comments)    ABD. PAIN   Penicillins Other (See Comments)    UNKNOWN - WAS AS A CHILD   Past Medical History:  Diagnosis Date   Arthritis    "all over"   Diabetes mellitus without complication (HCC)    DVT (deep venous thrombosis) (HCC) 10/2008   left arm   GERD (gastroesophageal reflux disease)    Headache(784.0)    sinus   Inflammatory carcinoma of right breast dx'd 07/2008   PONV (postoperative nausea and vomiting)    Seasonal allergies    Sinusitis    Vitamin D deficiency 04/13/2014    Current Outpatient Medications:    acetaminophen (TYLENOL) 650 MG CR tablet, Take 1,300 mg by mouth at bedtime., Disp: , Rfl:    buPROPion ER (WELLBUTRIN SR) 100 MG 12 hr tablet, Take 1 tablet (100 mg total) by mouth in the morning., Disp: 90 tablet, Rfl: 0   celecoxib (CELEBREX) 50 MG capsule, Take 1 capsule (50 mg total) by mouth 2 (two) times daily as needed for pain (take with food)., Disp: 180 capsule, Rfl: 0   doxycycline (VIBRA-TABS) 100 MG tablet, Take 1 tablet (100 mg total) by mouth 2 (two) times daily., Disp: 42 tablet, Rfl: 0   fluticasone (FLONASE) 50 MCG/ACT nasal spray, Place 2 sprays into both nostrils daily., Disp: 16 g, Rfl: PRN   hydrOXYzine (VISTARIL) 25 MG capsule, Take 1 capsule (25 mg total) by mouth every 8 (eight) hours as needed., Disp: 60 capsule, Rfl: 0    Loratadine (CLARITIN PO), Take by mouth., Disp: , Rfl:    mupirocin ointment (BACTROBAN) 2 %, Apply 1 Application topically 3 (three) times daily., Disp: , Rfl:    pantoprazole (PROTONIX) 40 MG tablet, TAKE 1 TABLET BY MOUTH DAILY. MAY TAKE AN EXTRA TABLET IN THE EVENING IF NEEDED., Disp: 135 tablet, Rfl: 3   pravastatin (PRAVACHOL) 40 MG tablet, Take 1 tablet (40 mg total) by mouth daily. For cholesterol. To replace the Rosuvastatin, Disp: 90 tablet, Rfl: 3   triamterene-hydrochlorothiazide (MAXZIDE) 75-50 MG tablet, Take 1 tablet by mouth daily., Disp: 90 tablet, Rfl: 3 Social History   Socioeconomic History   Marital status: Married    Spouse name: Pecola Leisure   Number of children: 2   Years of education: 12   Highest education level: Some college, no degree  Occupational History   Occupation: disabled  Tobacco Use   Smoking status: Never   Smokeless tobacco: Never  Vaping Use   Vaping status: Never Used  Substance and Sexual Activity   Alcohol use: No   Drug use: No   Sexual activity: Not Currently    Birth control/protection: Surgical, Post-menopausal    Comment: tubal  Other Topics Concern   Not on file  Social History Narrative   One daughter lives with them   Social Drivers of Health  Financial Resource Strain: Medium Risk (09/11/2022)   Overall Financial Resource Strain (CARDIA)    Difficulty of Paying Living Expenses: Somewhat hard  Food Insecurity: No Food Insecurity (10/25/2022)   Hunger Vital Sign    Worried About Running Out of Food in the Last Year: Never true    Ran Out of Food in the Last Year: Never true  Transportation Needs: No Transportation Needs (10/25/2022)   PRAPARE - Administrator, Civil Service (Medical): No    Lack of Transportation (Non-Medical): No  Physical Activity: Inactive (09/11/2022)   Exercise Vital Sign    Days of Exercise per Week: 0 days    Minutes of Exercise per Session: 0 min  Stress: No Stress Concern Present (09/11/2022)    [employer] of Occupational Health - Occupational Stress Questionnaire    Feeling of Stress : Only a little  Social Connections: Moderately Isolated (09/11/2022)   Social Connection and Isolation Panel [NHANES]    Frequency of Communication with Friends and Family: Twice a week    Frequency of Social Gatherings with Friends and Family: Once a week    Attends Religious Services: Never    Database administrator or Organizations: No    Attends Banker Meetings: Never    Marital Status: Married  Catering manager Violence: Not At Risk (09/11/2022)   Humiliation, Afraid, Rape, and Kick questionnaire    Fear of Current or Ex-Partner: No    Emotionally Abused: No    Physically Abused: No    Sexually Abused: No   Family History  Problem Relation Age of Onset   Breast cancer Mother    Cancer Mother        breast, colon, ovarian   Heart disease Father    Cancer Paternal Uncle        pancreatic   Deep vein thrombosis Brother    Aortic aneurysm Brother    ADD / ADHD Daughter    Asthma Daughter    Diabetes Maternal Grandmother    Stroke Maternal Grandmother    Hypothyroidism Maternal Grandfather    Heart disease Maternal Grandfather    Anuerysm Paternal Grandmother    Heart attack Paternal Grandfather     Objective: Office vital signs reviewed. There were no vitals taken for this visit.  Physical Examination:  General: Awake, alert, *** nourished, No acute distress HEENT: Normal    Neck: No masses palpated. No lymphadenopathy    Ears: Tympanic membranes intact, normal light reflex, no erythema, no bulging    Eyes: PERRLA, extraocular membranes intact, sclera ***    Nose: nasal turbinates moist, *** nasal discharge    Throat: moist mucus membranes, no erythema, *** tonsillar exudate.  Airway is patent Cardio: regular rate and rhythm, S1S2 heard, no murmurs appreciated Pulm: clear to auscultation bilaterally, no wheezes, rhonchi or rales; normal work of breathing  on room air GI: soft, non-tender, non-distended, bowel sounds present x4, no hepatomegaly, no splenomegaly, no masses GU: external vaginal tissue ***, cervix ***, *** punctate lesions on cervix appreciated, *** discharge from cervical os, *** bleeding, *** cervical motion tenderness, *** abdominal/ adnexal masses Extremities: warm, well perfused, No edema, cyanosis or clubbing; +*** pulses bilaterally MSK: *** gait and *** station Skin: dry; intact; no rashes or lesions Neuro: *** Strength and light touch sensation grossly intact, *** DTRs ***/4  Assessment/ Plan: 63 y.o. female   No diagnosis found.  ***   Rafan Sanders Hulen Skains, DO [facility] [phone]

## 2023-03-12 LAB — URINE CULTURE

## 2023-03-20 ENCOUNTER — Ambulatory Visit: Payer: Self-pay | Admitting: *Deleted

## 2023-03-20 NOTE — Patient Instructions (Addendum)
 Visit Information  Thank you for taking time to visit with me today. Please don't hesitate to contact me if I can be of assistance to you.   [email or web address]  What is Vasovagal Syncope?  Vasovagal syncope (VVS), otherwise known as neurally mediated syncope (NMS), is a form of syncope where the autonomic nervous system suddenly fails to maintain an adequate vascular tone resulting in hypotension (low blood pressure) and bradycardia (low heart rate) causing temporary loss of consciousness. Orthostatic stress is typically the main trigger for a vasovagal syncope episode. Syncope, or fainting/passing out, is a common problem that can result from many different causes. It presents as a temporary loss of consciousness with the inability to maintain postural tone. Some individuals are not able to determine the exact cause of their syncope, and many only have one episode in their entire life. Other individuals can have recurrent episodes of passing out, commonly caused by reflex syncope such as vasovagal syncope or cardiac syncope.  Incidence/Prevalence 1,3 1 2   Symptoms: Feelings of warmth Claminess Sweating Flushing Nausea Graying of vision or vision loss Changes in hearing Pallor (looking pale) Syncope can be accompanied by a period where the individual experiences symptoms known as presyncope prior to losing consciousness. Pre-syncope symptoms may include: Usually while unconscious, the individual is motionless, however, fine and coarse myoclonic movements, such as tremors or jerking, have been seen in around 10% of cases. Unconsciousness typically only lasts 1-2 minutes but full recovery can be slow and take minutes to hours. After the syncopal event, the individual experiences intense fatigue.    The following diagnostic tests may be useful when vasovagal syncope is suspected: Diagnosis 1 3 1  Emotional distress, or Other noxious  stimuli. There is a drop in blood pressure and heart rate. Symptoms like sweating, warmth, and nausea are experienced. There is fatigue following the episode. Resting 12 lead ECG Tilt table testing Holter monitor Cardiac stress test if syncope occurs during exertion Diagnostic Criteria: 1. 2. 3. 4.  Treatment for vasovagal syncope typically starts with education on the diagnosis and prognosis. Education should focus on avoidance of triggers like prolonged standing, warm environments, etc. Physical counterpressure maneuvers can be helpful for presyncopal episodes to avoid loss of consciousness. An increase in salt and fluid intake may be helpful in managing symptoms. If the syncope continues, medications may be required. Midodrine, fludrocortisone, beta blockers, and/or selective serotonin reuptake inhibitors (SSRIs) may be helpful.  Following are the goals we discussed today:   Goals Addressed             This Visit's Progress    Home management of medicine/side effects, diabetes, hypertension, insect bites, utilities (care coordination)   On track    Diabetes controlled with diet Tick bites- had further bites but no burying 01/29/23  Interventions Today    Flowsheet Row Most Recent Value  Chronic Disease   Chronic disease during today's visit Other  General Interventions   General Interventions Discussed/Reviewed General Interventions Reviewed, Doctor Visits, Communication with, Lipid Profile  Doctor Visits Discussed/Reviewed Doctor Visits Reviewed, PCP, Specialist  PCP/Specialist Visits Compliance with follow-up visit  Exercise Interventions   Exercise Discussed/Reviewed Exercise Reviewed, Physical Activity  Physical Activity Discussed/Reviewed Physical Activity Discussed, Types of exercise  Education Interventions   Education Provided Provided Web-based Education, Provided Education  [triglyceride diet/ triglyceride education]  Provided Verbal Education On Labs, Medication, Exercise   Labs Reviewed Lipid Profile  Mental Health Interventions   Mental Health Discussed/Reviewed Mental Health Reviewed, Coping Strategies  Nutrition  Interventions   Nutrition Discussed/Reviewed Nutrition Discussed, Fluid intake, Decreasing fats  Pharmacy Interventions   Pharmacy Dicussed/Reviewed Pharmacy Topics Reviewed, Affording Medications, Medications and their functions        Interventions Today    Flowsheet Row Most Recent Value  Chronic Disease   Chronic disease during today's visit Other  General Interventions   General Interventions Discussed/Reviewed General Interventions Reviewed, Doctor Visits, Communication with, Lipid Profile  Doctor Visits Discussed/Reviewed Doctor Visits Reviewed, PCP, Specialist  PCP/Specialist Visits Compliance with follow-up visit  Exercise Interventions   Exercise Discussed/Reviewed Exercise Reviewed, Physical Activity  Physical Activity Discussed/Reviewed Physical Activity Discussed, Types of exercise  Education Interventions   Education Provided Provided Web-based Education, Provided Education  [triglyceride diet/ triglyceride education]  Provided Verbal Education On Labs, Medication, Exercise  Labs Reviewed Lipid Profile  Mental Health Interventions   Mental Health Discussed/Reviewed Mental Health Reviewed, Coping Strategies  Nutrition Interventions   Nutrition Discussed/Reviewed Nutrition Discussed, Fluid intake, Decreasing fats  Pharmacy Interventions   Pharmacy Dicussed/Reviewed Pharmacy Topics Reviewed, Affording Medications, Medications and their functions      Interventions Today    Flowsheet Row Most Recent Value  Chronic Disease   Chronic disease during today's visit Other  General Interventions   General Interventions Discussed/Reviewed General Interventions Reviewed, Doctor Visits, Communication with, Lipid Profile  Doctor Visits Discussed/Reviewed Doctor Visits Reviewed, PCP, Specialist  PCP/Specialist Visits Compliance  with follow-up visit  Exercise Interventions   Exercise Discussed/Reviewed Exercise Reviewed, Physical Activity  Physical Activity Discussed/Reviewed Physical Activity Discussed, Types of exercise  Education Interventions   Education Provided Provided Web-based Education, Provided Education  [triglyceride diet/ triglyceride education]  Provided Verbal Education On Labs, Medication, Exercise  Labs Reviewed Lipid Profile  Mental Health Interventions   Mental Health Discussed/Reviewed Mental Health Reviewed, Coping Strategies  Nutrition Interventions   Nutrition Discussed/Reviewed Nutrition Discussed, Fluid intake, Decreasing fats  Pharmacy Interventions   Pharmacy Dicussed/Reviewed Pharmacy Topics Reviewed, Affording Medications, Medications and their functions              Our next appointment is by telephone on 04/17/23 at 2:30 pm  Please call the care guide team at [phone] if you need to cancel or reschedule your appointment.   If you are experiencing a Mental Health or Behavioral Health Crisis or need someone to talk to, please call the Suicide and Crisis Lifeline: 988 call the [place]  National Suicide Prevention Lifeline: 726-355-6189 or TTY: 8647301053 TTY 332-487-9811) to talk to a trained counselor call 1-800-273-TALK (toll free, 24 hour hotline) call the [facility]: 972-509-3760 call 911   Patient verbalizes understanding of instructions and care plan provided today and agrees to view in MyChart. Active MyChart status and patient understanding of how to access instructions and care plan via MyChart confirmed with patient.     The patient has been provided with contact information for the care management team and has been advised to call with any health related questions or concerns.   Enez Monahan L. Ramonita, RN, BSN, CCM [place]  [facility], [facility] RN Care Manager Direct Dial: [phone]  Fax: [phone] Mailing  Address: [address]  [place] [address] Website: [email or web address]

## 2023-03-20 NOTE — Patient Outreach (Signed)
 Care Coordination   Follow Up Visit Note   03/20/2023 Name: Wendy Gilbert MRN: [id number] DOB: 1960/11/11  Wendy Gilbert Steury is a 63 y.o. year old female who sees Jolinda Norene HERO, DO for primary care. I spoke with  Wendy Gilbert Wendy Gilbert by phone today.  What matters to the patients health and wellness today?  Abdominal pain, constipation for 2 weeks, (CT scans+) urinary infections after she spent time with her daughter in ICU Recent concerns with daughter's health - ICU visit (low blood, low heart rate, sweating)  Patient regular bowel elimination medicines did not work. She did not have relief until she took a suppository that helped. She states this type of episode happens only every 10-15 yr . Her CT scans + indicated no blockage. Once she was able to have the first stool, she has been regular since then Voices understanding of taking increase water  with metamucil & miralax  doses + increasing movement as part of the bowel elimination plan of care    Goals Addressed             This Visit's Progress    Home management of medicine/side effects, diabetes, hypertension, insect bites, utilities (care coordination)   On track    Diabetes controlled with diet Tick bites- had further bites but no burying 01/29/23  Interventions Today    Flowsheet Row Most Recent Value  Chronic Disease   Chronic disease during today's visit Other  General Interventions   General Interventions Discussed/Reviewed General Interventions Reviewed, Doctor Visits, Communication with, Lipid Profile  Doctor Visits Discussed/Reviewed Doctor Visits Reviewed, PCP, Specialist  PCP/Specialist Visits Compliance with follow-up visit  Exercise Interventions   Exercise Discussed/Reviewed Exercise Reviewed, Physical Activity  Physical Activity Discussed/Reviewed Physical Activity Discussed, Types of exercise  Education Interventions   Education Provided Provided Web-based Education, Provided Education  [triglyceride diet/  triglyceride education]  Provided Verbal Education On Labs, Medication, Exercise  Labs Reviewed Lipid Profile  Mental Health Interventions   Mental Health Discussed/Reviewed Mental Health Reviewed, Coping Strategies  Nutrition Interventions   Nutrition Discussed/Reviewed Nutrition Discussed, Fluid intake, Decreasing fats  Pharmacy Interventions   Pharmacy Dicussed/Reviewed Pharmacy Topics Reviewed, Affording Medications, Medications and their functions        Interventions Today    Flowsheet Row Most Recent Value  Chronic Disease   Chronic disease during today's visit Other  General Interventions   General Interventions Discussed/Reviewed General Interventions Reviewed, Doctor Visits, Communication with, Lipid Profile  Doctor Visits Discussed/Reviewed Doctor Visits Reviewed, PCP, Specialist  PCP/Specialist Visits Compliance with follow-up visit  Exercise Interventions   Exercise Discussed/Reviewed Exercise Reviewed, Physical Activity  Physical Activity Discussed/Reviewed Physical Activity Discussed, Types of exercise  Education Interventions   Education Provided Provided Web-based Education, Provided Education  [triglyceride diet/ triglyceride education]  Provided Verbal Education On Labs, Medication, Exercise  Labs Reviewed Lipid Profile  Mental Health Interventions   Mental Health Discussed/Reviewed Mental Health Reviewed, Coping Strategies  Nutrition Interventions   Nutrition Discussed/Reviewed Nutrition Discussed, Fluid intake, Decreasing fats  Pharmacy Interventions   Pharmacy Dicussed/Reviewed Pharmacy Topics Reviewed, Affording Medications, Medications and their functions      Interventions Today    Flowsheet Row Most Recent Value  Chronic Disease   Chronic disease during today's visit Other  General Interventions   General Interventions Discussed/Reviewed General Interventions Reviewed, Doctor Visits, Communication with, Lipid Profile  Doctor Visits  Discussed/Reviewed Doctor Visits Reviewed, PCP, Specialist  PCP/Specialist Visits Compliance with follow-up visit  Exercise Interventions   Exercise Discussed/Reviewed Exercise Reviewed,  Physical Activity  Physical Activity Discussed/Reviewed Physical Activity Discussed, Types of exercise  Education Interventions   Education Provided Provided Web-based Education, Provided Education  [triglyceride diet/ triglyceride education]  Provided Verbal Education On Labs, Medication, Exercise  Labs Reviewed Lipid Profile  Mental Health Interventions   Mental Health Discussed/Reviewed Mental Health Reviewed, Coping Strategies  Nutrition Interventions   Nutrition Discussed/Reviewed Nutrition Discussed, Fluid intake, Decreasing fats  Pharmacy Interventions   Pharmacy Dicussed/Reviewed Pharmacy Topics Reviewed, Affording Medications, Medications and their functions              SDOH assessments and interventions completed:  No     Care Coordination Interventions:  Yes, provided   Follow up plan: Follow up call scheduled for 04/17/23    Encounter Outcome:  Patient Visit Completed   Suzen L. Ramonita, RN, BSN, CCM [place]  [facility], [facility] RN Care Manager Direct Dial: [phone]  Fax: [phone] Mailing Address: [address]  [place] [address] Website: [email or web address]

## 2023-04-17 ENCOUNTER — Ambulatory Visit: Payer: Self-pay | Admitting: *Deleted

## 2023-04-17 NOTE — Patient Outreach (Signed)
 Care Coordination   Follow Up Visit Note   04/17/2023 updated note for 04/17/23 Name: Wendy Gilbert MRN: [id number] DOB: [birth year]  Wendy Gilbert is a 63 y.o. year old female who sees Raliegh Ip, DO for primary care. I spoke with  Wendy Duhamel Terral by phone today.  What matters to the patients health and wellness today?  Bowel Elimination, update on daughter     Goals Addressed   None    Interventions Today    Flowsheet Row Most Recent Value  Chronic Disease   Chronic disease during today's visit Other  General Interventions   General Interventions Discussed/Reviewed General Interventions Reviewed, Doctor Visits  Doctor Visits Discussed/Reviewed Doctor Visits Reviewed, PCP  PCP/Specialist Visits Compliance with follow-up visit  Education Interventions   Education Provided Provided Education  Provided Verbal Education On Mental Health/Coping with Illness, Community Resources  Mental Health Interventions   Mental Health Discussed/Reviewed Mental Health Reviewed, Coping Strategies  Pharmacy Interventions   Pharmacy Dicussed/Reviewed Pharmacy Topics Reviewed, Affording Medications        SDOH assessments and interventions completed:  No     Care Coordination Interventions:  Yes, provided   Follow up plan: Follow up call scheduled for 05/15/23    Encounter Outcome:  Patient Visit Completed   Cala Bradford L. Noelle Penner, RN, BSN, CCM [place]  [facility], [facility] RN Care Manager Direct Dial: [phone]  Fax: [phone]

## 2023-04-18 ENCOUNTER — Ambulatory Visit (INDEPENDENT_AMBULATORY_CARE_PROVIDER_SITE_OTHER): Payer: Medicare Other | Admitting: Family Medicine

## 2023-04-18 ENCOUNTER — Encounter: Payer: Self-pay | Admitting: Family Medicine

## 2023-04-18 VITALS — BP 116/76 | HR 90 | Temp 98.4°F | Ht 64.0 in | Wt 191.6 lb

## 2023-04-18 DIAGNOSIS — Z6834 Body mass index (BMI) 34.0-34.9, adult: Secondary | ICD-10-CM

## 2023-04-18 DIAGNOSIS — M25511 Pain in right shoulder: Secondary | ICD-10-CM

## 2023-04-18 DIAGNOSIS — M25512 Pain in left shoulder: Secondary | ICD-10-CM

## 2023-04-18 DIAGNOSIS — N183 Chronic kidney disease, stage 3 unspecified: Secondary | ICD-10-CM | POA: Diagnosis not present

## 2023-04-18 DIAGNOSIS — E1122 Type 2 diabetes mellitus with diabetic chronic kidney disease: Secondary | ICD-10-CM

## 2023-04-18 DIAGNOSIS — E1159 Type 2 diabetes mellitus with other circulatory complications: Secondary | ICD-10-CM | POA: Diagnosis not present

## 2023-04-18 DIAGNOSIS — I152 Hypertension secondary to endocrine disorders: Secondary | ICD-10-CM | POA: Diagnosis not present

## 2023-04-18 DIAGNOSIS — G8929 Other chronic pain: Secondary | ICD-10-CM | POA: Diagnosis not present

## 2023-04-18 DIAGNOSIS — E785 Hyperlipidemia, unspecified: Secondary | ICD-10-CM

## 2023-04-18 DIAGNOSIS — E1169 Type 2 diabetes mellitus with other specified complication: Secondary | ICD-10-CM | POA: Diagnosis not present

## 2023-04-18 LAB — BAYER DCA HB A1C WAIVED: HB A1C (BAYER DCA - WAIVED): 6.4 % — ABNORMAL HIGH (ref 4.8–5.6)

## 2023-04-18 NOTE — Progress Notes (Signed)
 Subjective: CC:DM PCP: Raliegh Ip, DO Wendy Gilbert is a 63 y.o. female presenting to clinic today for:  1. Type 2 Diabetes with hypertension, hyperlipidemia:  Compliant with diet, blood pressure and cholesterol medications.  Diabetes Health Maintenance Due  Topic Date Due   FOOT EXAM  05/27/2023   HEMOGLOBIN A1C  05/28/2023   OPHTHALMOLOGY EXAM  12/17/2023    Last A1c:  Lab Results  Component Value Date   HGBA1C 7.0 (H) 11/27/2022    ROS: Denies any chest pain, shortness of breath or dizziness  2.  Shoulder pain Bilateral shoulder pain.  Previously told that she had advanced osteoarthritis of the shoulder.  She does not want to have any injections or surgery.  Symptoms are refractory to Celebrex so she discontinued it.  3.  Mood Started Wellbutrin 100 mg last visit but found it to be constipating and severely so so she discontinued it.   ROS: Per HPI  Allergies  Allergen Reactions   Demerol Other (See Comments)    MENTAL STATUS CHANGE   Erythromycin Nausea And Vomiting   Adhesive [Tape] Rash   Cephalexin Hives and Other (See Comments)    ABD. PAIN   Penicillins Other (See Comments)    UNKNOWN - WAS AS A CHILD   Past Medical History:  Diagnosis Date   Arthritis    "all over"   Diabetes mellitus without complication (HCC)    DVT (deep venous thrombosis) (HCC) 10/2008   left arm   GERD (gastroesophageal reflux disease)    Headache(784.0)    sinus   Inflammatory carcinoma of right breast dx'd 07/2008   PONV (postoperative nausea and vomiting)    Seasonal allergies    Sinusitis    Vitamin D deficiency 04/13/2014    Current Outpatient Medications:    acetaminophen (TYLENOL) 650 MG CR tablet, Take 1,300 mg by mouth at bedtime., Disp: , Rfl:    buPROPion ER (WELLBUTRIN SR) 100 MG 12 hr tablet, Take 1 tablet (100 mg total) by mouth in the morning., Disp: 90 tablet, Rfl: 0   celecoxib (CELEBREX) 50 MG capsule, Take 1 capsule (50 mg total) by mouth 2  (two) times daily as needed for pain (take with food)., Disp: 180 capsule, Rfl: 0   doxycycline (VIBRA-TABS) 100 MG tablet, Take 1 tablet (100 mg total) by mouth 2 (two) times daily., Disp: 42 tablet, Rfl: 0   fluticasone (FLONASE) 50 MCG/ACT nasal spray, Place 2 sprays into both nostrils daily., Disp: 16 g, Rfl: PRN   hydrOXYzine (VISTARIL) 25 MG capsule, Take 1 capsule (25 mg total) by mouth every 8 (eight) hours as needed., Disp: 60 capsule, Rfl: 0   Loratadine (CLARITIN PO), Take by mouth., Disp: , Rfl:    mupirocin ointment (BACTROBAN) 2 %, Apply 1 Application topically 3 (three) times daily., Disp: , Rfl:    pantoprazole (PROTONIX) 40 MG tablet, TAKE 1 TABLET BY MOUTH DAILY. MAY TAKE AN EXTRA TABLET IN THE EVENING IF NEEDED., Disp: 135 tablet, Rfl: 3   pravastatin (PRAVACHOL) 40 MG tablet, Take 1 tablet (40 mg total) by mouth daily. For cholesterol. To replace the Rosuvastatin, Disp: 90 tablet, Rfl: 3   sulfamethoxazole-trimethoprim (BACTRIM DS) 800-160 MG tablet, Take 1 tablet by mouth 2 (two) times daily., Disp: 14 tablet, Rfl: 0   triamterene-hydrochlorothiazide (MAXZIDE) 75-50 MG tablet, Take 1 tablet by mouth daily., Disp: 90 tablet, Rfl: 3 Social History   Socioeconomic History   Marital status: Married    Spouse name: Pecola Leisure  Number of children: 2   Years of education: 12   Highest education level: Some college, no degree  Occupational History   Occupation: disabled  Tobacco Use   Smoking status: Never   Smokeless tobacco: Never  Vaping Use   Vaping status: Never Used  Substance and Sexual Activity   Alcohol use: No   Drug use: No   Sexual activity: Not Currently    Birth control/protection: Surgical, Post-menopausal    Comment: tubal  Other Topics Concern   Not on file  Social History Narrative   One daughter lives with them   Social Drivers of Health   Financial Resource Strain: Medium Risk (09/11/2022)   Overall Financial Resource Strain (CARDIA)    Difficulty  of Paying Living Expenses: Somewhat hard  Food Insecurity: No Food Insecurity (10/25/2022)   Hunger Vital Sign    Worried About Running Out of Food in the Last Year: Never true    Ran Out of Food in the Last Year: Never true  Transportation Needs: No Transportation Needs (10/25/2022)   PRAPARE - Administrator, Civil Service (Medical): No    Lack of Transportation (Non-Medical): No  Physical Activity: Inactive (09/11/2022)   Exercise Vital Sign    Days of Exercise per Week: 0 days    Minutes of Exercise per Session: 0 min  Stress: No Stress Concern Present (09/11/2022)   [employer] of Occupational Health - Occupational Stress Questionnaire    Feeling of Stress : Only a little  Social Connections: Moderately Isolated (09/11/2022)   Social Connection and Isolation Panel [NHANES]    Frequency of Communication with Friends and Family: Twice a week    Frequency of Social Gatherings with Friends and Family: Once a week    Attends Religious Services: Never    Database administrator or Organizations: No    Attends Banker Meetings: Never    Marital Status: Married  Catering manager Violence: Not At Risk (09/11/2022)   Humiliation, Afraid, Rape, and Kick questionnaire    Fear of Current or Ex-Partner: No    Emotionally Abused: No    Physically Abused: No    Sexually Abused: No   Family History  Problem Relation Age of Onset   Breast cancer Mother    Cancer Mother        breast, colon, ovarian   Heart disease Father    Cancer Paternal Uncle        pancreatic   Deep vein thrombosis Brother    Aortic aneurysm Brother    ADD / ADHD Daughter    Asthma Daughter    Diabetes Maternal Grandmother    Stroke Maternal Grandmother    Hypothyroidism Maternal Grandfather    Heart disease Maternal Grandfather    Anuerysm Paternal Grandmother    Heart attack Paternal Grandfather     Objective: Office vital signs reviewed. BP 116/76   Pulse 90   Temp 98.4 F (36.9  C)   Ht 5\' 4"  (1.626 m)   Wt 191 lb 9.6 oz (86.9 kg)   SpO2 97%   BMI 32.89 kg/m   Physical Examination:  General: Awake, alert, obese, No acute distress HEENT: Sclera white.  Poor dentition Cardio: regular rate and rhythm, S1S2 heard, no murmurs appreciated Pulm: clear to auscultation bilaterally, no wheezes, rhonchi or rales; normal work of breathing on room air     04/18/2023   11:06 AM 03/11/2023    3:35 PM 03/11/2023    3:14 PM  Depression screen PHQ 2/9  Decreased Interest 0 0 0  Down, Depressed, Hopeless 0 0 0  PHQ - 2 Score 0 0 0  Altered sleeping 0  0  Tired, decreased energy 0  0  Change in appetite 0  0  Feeling bad or failure about yourself  0  0  Trouble concentrating 0  0  Moving slowly or fidgety/restless 0  0  Suicidal thoughts 0  0  PHQ-9 Score 0  0  Difficult doing work/chores Not difficult at all        04/18/2023   11:05 AM 03/11/2023    3:15 PM 03/06/2023    2:29 PM 11/27/2022    9:19 AM  GAD 7 : Generalized Anxiety Score  Nervous, Anxious, on Edge 0 0 0 0  Control/stop worrying 0 0 0 0  Worry too much - different things 0 0 0 0  Trouble relaxing 0 0 0 0  Restless 0 0 0 0  Easily annoyed or irritable 0 0 0 0  Afraid - awful might happen 0 0 0 0  Total GAD 7 Score 0 0 0 0  Anxiety Difficulty Not difficult at all  Not difficult at all    Assessment/ Plan: 63 y.o. female   Controlled type 2 diabetes mellitus with stage 3 chronic kidney disease, without long-term current use of insulin (HCC) - Plan: Bayer DCA Hb A1c Waived  Hypertension associated with diabetes (HCC)  Hyperlipidemia associated with type 2 diabetes mellitus (HCC)  Morbid obesity (HCC)  BMI 34.0-34.9,adult  Chronic pain of both shoulders - Plan: lidocaine (LIDODERM) 5 %  She remains under good control through diet alone  Blood pressure controlled.  Continue current regimen  Continue statin  Wellbutrin was ineffective/caused side effects so she will continue to work on diet  and exercise  Lidoderm patches ordered for shoulder pain.  Use as directed.  Raliegh Ip, DO [facility] [phone]

## 2023-04-19 MED ORDER — LIDOCAINE 5 % EX PTCH
1.0000 | MEDICATED_PATCH | CUTANEOUS | 99 refills | Status: DC
Start: 2023-04-19 — End: 2023-07-22

## 2023-05-15 ENCOUNTER — Ambulatory Visit: Payer: Self-pay | Admitting: *Deleted

## 2023-05-15 NOTE — Patient Instructions (Signed)
 Visit Information  Thank you for taking time to visit with me today. Please don't hesitate to contact me if I can be of assistance to you.   Following are the goals we discussed today:   Goals Addressed   None     Our next appointment is  n/a  on n/a at n/a  Please call the care guide team at [phone] if you need to cancel or reschedule your appointment.   If you are experiencing a Mental Health or Behavioral Health Crisis or need someone to talk to, please call the Suicide and Crisis Lifeline: 988 call the [place] National Suicide Prevention Lifeline: 7201373334 or TTY: 831-073-9699 TTY (408)870-7936) to talk to a trained counselor call 1-800-273-TALK (toll free, 24 hour hotline) call the [facility]: ([phone] call 911   Patient verbalizes understanding of instructions and care plan provided today and agrees to view in MyChart. Active MyChart status and patient understanding of how to access instructions and care plan via MyChart confirmed with patient.     The patient has been provided with contact information for the care management team and has been advised to call with any health related questions or concerns.   Ricca Melgarejo L. Noelle Penner, RN, BSN, CCM [facility], [facility] RN Care Manager Direct Dial: [phone]  Fax: [phone]

## 2023-05-17 NOTE — Patient Instructions (Addendum)
 Visit Information  Thank you for taking time to visit with me today. Please don't hesitate to contact me if I can be of assistance to you.   Following are the goals we discussed today:  Interventions Today     Flowsheet Row Most Recent Value  Chronic Disease    Chronic disease during today's visit Other  General Interventions    General Interventions Discussed/Reviewed General Interventions Reviewed, Doctor Visits  Doctor Visits Discussed/Reviewed Doctor Visits Reviewed, PCP  PCP/Specialist Visits Compliance with follow-up visit  Education Interventions    Education Provided Provided Education  Provided Verbal Education On Mental Health/Coping with Illness, Community Resources  Mental Health Interventions    Mental Health Discussed/Reviewed Mental Health Reviewed, Coping Strategies  Pharmacy Interventions    Pharmacy Dicussed/Reviewed Pharmacy Topics Reviewed, Affording Medications           Our next appointment is by telephone on 05/15/23 at 3 pm  Please call the care guide team at [phone] if you need to cancel or reschedule your appointment.   If you are experiencing a Mental Health or Behavioral Health Crisis or need someone to talk to, please call the Suicide and Crisis Lifeline: 988 call the [place] National Suicide Prevention Lifeline: 952-226-3290 or TTY: (570)082-2609 TTY (575)036-4125) to talk to a trained counselor call 1-800-273-TALK (toll free, 24 hour hotline) call the [facility]: [phone] call 911   Patient verbalizes understanding of instructions and care plan provided today and agrees to view in MyChart. Active MyChart status and patient understanding of how to access instructions and care plan via MyChart confirmed with patient.     The patient has been provided with contact information for the care management team and has been advised to call with any health related questions or concerns.    Cambryn Charters L. Noelle Penner, RN, BSN, CCM [facility], [facility] RN Care Manager Direct Dial: [phone]  Fax: [phone]

## 2023-05-22 DIAGNOSIS — M79675 Pain in left toe(s): Secondary | ICD-10-CM | POA: Diagnosis not present

## 2023-05-22 DIAGNOSIS — M79674 Pain in right toe(s): Secondary | ICD-10-CM | POA: Diagnosis not present

## 2023-05-22 DIAGNOSIS — E1142 Type 2 diabetes mellitus with diabetic polyneuropathy: Secondary | ICD-10-CM | POA: Diagnosis not present

## 2023-05-22 DIAGNOSIS — B351 Tinea unguium: Secondary | ICD-10-CM | POA: Diagnosis not present

## 2023-05-22 DIAGNOSIS — L84 Corns and callosities: Secondary | ICD-10-CM | POA: Diagnosis not present

## 2023-05-27 ENCOUNTER — Encounter: Payer: Self-pay | Admitting: Family Medicine

## 2023-05-27 ENCOUNTER — Ambulatory Visit (INDEPENDENT_AMBULATORY_CARE_PROVIDER_SITE_OTHER): Admitting: Family Medicine

## 2023-05-27 VITALS — BP 123/82 | HR 83 | Ht 64.0 in

## 2023-05-27 DIAGNOSIS — B029 Zoster without complications: Secondary | ICD-10-CM | POA: Diagnosis not present

## 2023-05-27 MED ORDER — VALACYCLOVIR HCL 1 G PO TABS: 1000.0000 mg | ORAL_TABLET | Freq: Three times a day (TID) | ORAL | 0 refills | Status: AC

## 2023-05-27 NOTE — Progress Notes (Signed)
 Subjective: CC: Spider bite? PCP: Raliegh Ip, DO [id number] C Ordway is a 63 y.o. female presenting to clinic today for:  1.  Spider bite Patient reports that she noticed a spot on her right upper back in the last 24 hours.  She notes it burned entangled and itched and when her daughter looked at it, she took a picture of it and she thought maybe it might be a spider bite because she could see 2 bubbles on it.  She does not report any fevers.  She does report that her daughter put some pain cream on it but when she put it on it did not seem to be at the area of pain despite this being where the rash was.  She has had a history of shingles in the past but it was on her lower belly and has had 1 out of 2 of the Shingrix vaccinations   ROS: Per HPI  Allergies  Allergen Reactions   Demerol Other (See Comments)    MENTAL STATUS CHANGE   Erythromycin Nausea And Vomiting   Other Other (See Comments)   Adhesive [Tape] Rash   Cephalexin Hives and Other (See Comments)    ABD. PAIN   Penicillins Other (See Comments)    UNKNOWN - WAS AS A CHILD   Past Medical History:  Diagnosis Date   Arthritis    "all over"   Diabetes mellitus without complication (HCC)    DVT (deep venous thrombosis) (HCC) 10/2008   left arm   GERD (gastroesophageal reflux disease)    Headache(784.0)    sinus   Inflammatory carcinoma of right breast dx'd 07/2008   PONV (postoperative nausea and vomiting)    Seasonal allergies    Sinusitis    Vitamin D deficiency 04/13/2014    Current Outpatient Medications:    acetaminophen (TYLENOL) 650 MG CR tablet, Take 1,300 mg by mouth at bedtime., Disp: , Rfl:    buPROPion ER (WELLBUTRIN SR) 100 MG 12 hr tablet, Take 1 tablet (100 mg total) by mouth in the morning., Disp: 90 tablet, Rfl: 0   celecoxib (CELEBREX) 50 MG capsule, Take 1 capsule (50 mg total) by mouth 2 (two) times daily as needed for pain (take with food)., Disp: 180 capsule, Rfl: 0   doxycycline  (VIBRA-TABS) 100 MG tablet, Take 1 tablet (100 mg total) by mouth 2 (two) times daily., Disp: 42 tablet, Rfl: 0   fluticasone (FLONASE) 50 MCG/ACT nasal spray, Place 2 sprays into both nostrils daily., Disp: 16 g, Rfl: PRN   hydrOXYzine (VISTARIL) 25 MG capsule, Take 1 capsule (25 mg total) by mouth every 8 (eight) hours as needed., Disp: 60 capsule, Rfl: 0   lidocaine (LIDODERM) 5 %, Place 1 patch onto the skin daily. Remove & Discard patch within 12 hours or as directed by MD, Disp: 30 patch, Rfl: PRN   Loratadine (CLARITIN PO), Take by mouth., Disp: , Rfl:    mupirocin ointment (BACTROBAN) 2 %, Apply 1 Application topically 3 (three) times daily., Disp: , Rfl:    pantoprazole (PROTONIX) 40 MG tablet, TAKE 1 TABLET BY MOUTH DAILY. MAY TAKE AN EXTRA TABLET IN THE EVENING IF NEEDED., Disp: 135 tablet, Rfl: 3   pravastatin (PRAVACHOL) 40 MG tablet, Take 1 tablet (40 mg total) by mouth daily. For cholesterol. To replace the Rosuvastatin, Disp: 90 tablet, Rfl: 3   sulfamethoxazole-trimethoprim (BACTRIM DS) 800-160 MG tablet, Take 1 tablet by mouth 2 (two) times daily., Disp: 14 tablet, Rfl: 0  triamterene-hydrochlorothiazide (MAXZIDE) 75-50 MG tablet, Take 1 tablet by mouth daily., Disp: 90 tablet, Rfl: 3 Social History   Socioeconomic History   Marital status: Married    Spouse name: Alayne Hubert   Number of children: 2   Years of education: 12   Highest education level: Some college, no degree  Occupational History   Occupation: disabled  Tobacco Use   Smoking status: Never   Smokeless tobacco: Never  Vaping Use   Vaping status: Never Used  Substance and Sexual Activity   Alcohol use: No   Drug use: No   Sexual activity: Not Currently    Birth control/protection: Surgical, Post-menopausal    Comment: tubal  Other Topics Concern   Not on file  Social History Narrative   One daughter lives with them   Social Drivers of Health   Financial Resource Strain: Medium Risk (09/11/2022)    Overall Financial Resource Strain (CARDIA)    Difficulty of Paying Living Expenses: Somewhat hard  Food Insecurity: No Food Insecurity (10/25/2022)   Hunger Vital Sign    Worried About Running Out of Food in the Last Year: Never true    Ran Out of Food in the Last Year: Never true  Transportation Needs: No Transportation Needs (10/25/2022)   PRAPARE - Administrator, Civil Service (Medical): No    Lack of Transportation (Non-Medical): No  Physical Activity: Inactive (09/11/2022)   Exercise Vital Sign    Days of Exercise per Week: 0 days    Minutes of Exercise per Session: 0 min  Stress: No Stress Concern Present (09/11/2022)   [employer] of Occupational Health - Occupational Stress Questionnaire    Feeling of Stress : Only a little  Social Connections: Moderately Isolated (09/11/2022)   Social Connection and Isolation Panel [NHANES]    Frequency of Communication with Friends and Family: Twice a week    Frequency of Social Gatherings with Friends and Family: Once a week    Attends Religious Services: Never    Database administrator or Organizations: No    Attends Banker Meetings: Never    Marital Status: Married  Catering manager Violence: Not At Risk (09/11/2022)   Humiliation, Afraid, Rape, and Kick questionnaire    Fear of Current or Ex-Partner: No    Emotionally Abused: No    Physically Abused: No    Sexually Abused: No   Family History  Problem Relation Age of Onset   Breast cancer Mother    Cancer Mother        breast, colon, ovarian   Heart disease Father    Cancer Paternal Uncle        pancreatic   Deep vein thrombosis Brother    Aortic aneurysm Brother    ADD / ADHD Daughter    Asthma Daughter    Diabetes Maternal Grandmother    Stroke Maternal Grandmother    Hypothyroidism Maternal Grandfather    Heart disease Maternal Grandfather    Anuerysm Paternal Grandmother    Heart attack Paternal Grandfather     Objective: Office vital  signs reviewed. BP 123/82   Pulse 83   Ht 5\' 4"  (1.626 m)   SpO2 97%   BMI 32.89 kg/m   Physical Examination:  General: Awake, alert, well-appearing female no acute distress, No acute distress Skin: Flat, patch of skin in the right upper back that is slightly hyperpigmented and pink.  Upon closer review she has 2 vesicles appreciated in this area.  I do  not see any other vesicular formation.  Assessment/ Plan: 63 y.o. female   Herpes zoster without complication - Plan: valACYclovir (VALTREX) 1000 MG tablet  Suspicious for early shingles.  I did not feel this looks consistent with a spider bite.  I am going to treat her with Valtrex and capsaicin topically.  We discussed reasons for reevaluation.  She voiced good understanding   Telford Archambeau Bambi Bonine, DO [facility] [phone]

## 2023-05-27 NOTE — Patient Instructions (Addendum)
 Get some capsaicin cream at the drug store and apply as needed for pain to rash  Shingles  Shingles, or herpes zoster, is an infection. It gives you a skin rash and blisters. These infected areas may hurt a lot. Shingles only happens if: You've had chickenpox. You've been given a shot called a vaccine to protect you from getting chickenpox. Shingles is rare in this case. What are the causes? Shingles is caused by a germ called the varicella-zoster virus. This is the same germ that causes chickenpox. After you're exposed to the germ, it stays in your body but is dormant. This means it isn't active. Shingles happens if the germ becomes active again. This can happen years after you're first exposed to the germ. What increases the risk? You may be more likely to get shingles if: You're older than 65 years of age. You're under a lot of stress. You have a weak immune system. The immune system is your body's defense system. It may be weak if: You have human immunodeficiency virus (HIV). You have acquired immunodeficiency syndrome (AIDS). You have cancer. You take medicines that weaken your immune system. These include organ transplant medicines. What are the signs or symptoms? The first symptoms of shingles may be itching, tingling, or pain. Your skin may feel like it's burning. A few days or weeks later, you'll get a rash. Here's what you can expect: The rash is likely to be on one side of your body. The rash may be shaped like a belt or a band. Over time, it will turn into blisters filled with fluid. The blisters will break open and change into scabs. The scabs will dry up in about 2-3 weeks. You may also have: A fever. Chills. A headache. Nausea. How is this diagnosed? Shingles is diagnosed with a skin exam. A sample called a culture may be taken from one of your blisters and sent to a lab. This will show if you have shingles. How is this treated? The rash may last for several weeks.  There's no cure for shingles, but your health care provider may give you medicines. These medicines may: Help with pain. Help with itching. Help with irritation and swelling. Help you get better sooner. Help to prevent long-term problems. If the rash is on your face, you may need to see an eye doctor or an ear, nose, and throat (ENT) doctor. Follow these instructions at home: Medicines Take your medicines only as told by your provider. Put an anti-itch cream or numbing cream on the rash or blisters as told by your provider. Relieving itching and discomfort  To help with itching: Put cold, wet cloths called cold compresses on the rash or blisters. Take a cool bath. Try adding baking soda or dry oatmeal to the water. Do not bathe in hot water. Use calamine lotion on the rash or blisters. You can get this type of lotion at the store. Blister and rash care Keep your rash covered with a loose bandage. Wear loose clothes that don't rub on your rash. Take care of your rash as told by your provider. Make sure you: Wash your hands with soap and water for at least 20 seconds before and after you change your bandage. If you can't use soap and water, use hand sanitizer. Keep your rash and blisters clean by washing them with mild soap and cool water. Change your bandage. Check your rash every day for signs of infection. Check for: More redness, swelling, or pain. Fluid or  blood. Warmth. Pus or a bad smell. Do not scratch your rash. Do not pick at your blisters. To help you not scratch: Keep your fingernails clean and cut short. Try to wear gloves or mittens when you sleep. General instructions Rest. Wash your hands often with soap and water for at least 20 seconds. If you can't use soap and water, use hand sanitizer. Washing your hands lowers your chance of getting a skin infection. Your infection can cause chickenpox in others. If you have blisters that aren't scabs yet, stay away  from: Babies. Pregnant people. Children who have eczema. Older people who have organ transplants. People who have a long-term, or chronic, illness. Anyone who hasn't had chickenpox before. Anyone who hasn't gotten the chickenpox vaccine. How is this prevented? Vaccines are the best way to prevent you from getting chickenpox or shingles. Talk with your provider about getting these shots. Where to find more information Centers for Disease Control and Prevention (CDC): TonerPromos.no Contact a health care provider if: Your pain doesn't get better with medicine. Your pain doesn't get better after the rash heals. You have any signs of infection around the rash. Your rash or blisters get worse. You have a fever or chills. Get help right away if: The rash is on your face or nose. You have pain in your face or by your eye. You lose feeling on one side of your face. You have trouble seeing. You have ear pain or ringing in your ear. This information is not intended to replace advice given to you by your health care provider. Make sure you discuss any questions you have with your health care provider. Document Revised: 10/31/2022 Document Reviewed: 03/15/2022 Elsevier Patient Education  2024 [employer].

## 2023-06-23 ENCOUNTER — Inpatient Hospital Stay: Payer: Medicare Other | Attending: Hematology

## 2023-06-23 DIAGNOSIS — Z853 Personal history of malignant neoplasm of breast: Secondary | ICD-10-CM | POA: Diagnosis not present

## 2023-06-23 DIAGNOSIS — E559 Vitamin D deficiency, unspecified: Secondary | ICD-10-CM | POA: Insufficient documentation

## 2023-06-23 DIAGNOSIS — I890 Lymphedema, not elsewhere classified: Secondary | ICD-10-CM | POA: Insufficient documentation

## 2023-06-23 DIAGNOSIS — Z923 Personal history of irradiation: Secondary | ICD-10-CM | POA: Insufficient documentation

## 2023-06-23 DIAGNOSIS — Z86718 Personal history of other venous thrombosis and embolism: Secondary | ICD-10-CM | POA: Diagnosis not present

## 2023-06-23 DIAGNOSIS — K760 Fatty (change of) liver, not elsewhere classified: Secondary | ICD-10-CM | POA: Insufficient documentation

## 2023-06-23 DIAGNOSIS — Z800 Family history of malignant neoplasm of digestive organs: Secondary | ICD-10-CM | POA: Diagnosis not present

## 2023-06-23 DIAGNOSIS — Z803 Family history of malignant neoplasm of breast: Secondary | ICD-10-CM | POA: Diagnosis not present

## 2023-06-23 DIAGNOSIS — C50911 Malignant neoplasm of unspecified site of right female breast: Secondary | ICD-10-CM

## 2023-06-23 LAB — COMPREHENSIVE METABOLIC PANEL WITH GFR
ALT: 15 U/L (ref 0–44)
AST: 24 U/L (ref 15–41)
Albumin: 3.7 g/dL (ref 3.5–5.0)
Alkaline Phosphatase: 66 U/L (ref 38–126)
Anion gap: 8 (ref 5–15)
BUN: 22 mg/dL (ref 8–23)
CO2: 25 mmol/L (ref 22–32)
Calcium: 9.2 mg/dL (ref 8.9–10.3)
Chloride: 104 mmol/L (ref 98–111)
Creatinine, Ser: 0.97 mg/dL (ref 0.44–1.00)
GFR, Estimated: >60 mL/min (ref >60)
Glucose, Bld: 137 mg/dL — ABNORMAL HIGH (ref 70–99)
Potassium: 3.6 mmol/L (ref 3.5–5.1)
Sodium: 137 mmol/L (ref 135–145)
Total Bilirubin: 0.9 mg/dL (ref 0.0–1.2)
Total Protein: 7.2 g/dL (ref 6.5–8.1)

## 2023-06-23 LAB — CBC WITH DIFFERENTIAL/PLATELET
Abs Immature Granulocytes: 0.01 10*3/uL (ref 0.00–0.07)
Basophils Absolute: 0.1 10*3/uL (ref 0.0–0.1)
Basophils Relative: 1 %
Eosinophils Absolute: 0.3 10*3/uL (ref 0.0–0.5)
Eosinophils Relative: 5 %
HCT: 36.7 % (ref 36.0–46.0)
Hemoglobin: 11.9 g/dL — ABNORMAL LOW (ref 12.0–15.0)
Immature Granulocytes: 0 %
Lymphocytes Relative: 41 %
Lymphs Abs: 2.3 10*3/uL (ref 0.7–4.0)
MCH: 27.4 pg (ref 26.0–34.0)
MCHC: 32.4 g/dL (ref 30.0–36.0)
MCV: 84.4 fL (ref 80.0–100.0)
Monocytes Absolute: 0.5 10*3/uL (ref 0.1–1.0)
Monocytes Relative: 9 %
Neutro Abs: 2.5 10*3/uL (ref 1.7–7.7)
Neutrophils Relative %: 44 %
Platelets: ADEQUATE 10*3/uL (ref 150–400)
RBC: 4.35 MIL/uL (ref 3.87–5.11)
RDW: 13.7 % (ref 11.5–15.5)
WBC: 5.7 10*3/uL (ref 4.0–10.5)
nRBC: 0 % (ref 0.0–0.2)

## 2023-06-30 ENCOUNTER — Inpatient Hospital Stay: Payer: Medicare Other | Admitting: Hematology

## 2023-06-30 VITALS — BP 118/83 | HR 94 | Temp 99.2°F | Resp 20 | Wt 195.1 lb

## 2023-06-30 DIAGNOSIS — Z86718 Personal history of other venous thrombosis and embolism: Secondary | ICD-10-CM | POA: Diagnosis not present

## 2023-06-30 DIAGNOSIS — E559 Vitamin D deficiency, unspecified: Secondary | ICD-10-CM | POA: Diagnosis not present

## 2023-06-30 DIAGNOSIS — Z853 Personal history of malignant neoplasm of breast: Secondary | ICD-10-CM | POA: Diagnosis not present

## 2023-06-30 DIAGNOSIS — Z800 Family history of malignant neoplasm of digestive organs: Secondary | ICD-10-CM | POA: Diagnosis not present

## 2023-06-30 DIAGNOSIS — K760 Fatty (change of) liver, not elsewhere classified: Secondary | ICD-10-CM | POA: Diagnosis not present

## 2023-06-30 DIAGNOSIS — Z803 Family history of malignant neoplasm of breast: Secondary | ICD-10-CM | POA: Diagnosis not present

## 2023-06-30 DIAGNOSIS — C50911 Malignant neoplasm of unspecified site of right female breast: Secondary | ICD-10-CM

## 2023-06-30 DIAGNOSIS — I890 Lymphedema, not elsewhere classified: Secondary | ICD-10-CM | POA: Diagnosis not present

## 2023-06-30 DIAGNOSIS — Z923 Personal history of irradiation: Secondary | ICD-10-CM | POA: Diagnosis not present

## 2023-06-30 NOTE — Patient Instructions (Signed)
 [facility] at [facility] Discharge Instructions   You were seen and examined today by Dr. Ellin Saba.  He reviewed the results of your lab work which are normal/stable.   We will see you back in 1 year. We will repeat lab work prior to this visit.   Return as scheduled.    Thank you for choosing [facility] at [facility] to provide your oncology and hematology care.  To afford each patient quality time with our provider, please arrive at least 15 minutes before your scheduled appointment time.   If you have a lab appointment with the Cancer Center please come in thru the Main Entrance and check in at the main information desk.  You need to re-schedule your appointment should you arrive 10 or more minutes late.  We strive to give you quality time with our providers, and arriving late affects you and other patients whose appointments are after yours.  Also, if you no show three or more times for appointments you may be dismissed from the clinic at the providers discretion.     Again, thank you for choosing [facility]  Our hope is that these requests will decrease the amount of time that you wait before being seen by our physicians.       _____________________________________________________________  Should you have questions after your visit to [facility], please contact our office at [phone] and follow the prompts.  Our office hours are 8:00 a.m. and 4:30 p.m. Monday - Friday.  Please note that voicemails left after 4:00 p.m. may not be returned until the following business day.  We are closed weekends and major holidays.  You do have access to a nurse 24-7, just call the main number to the clinic [phone] and do not press any options, hold on the line and a nurse will answer the phone.    For prescription refill requests, have your pharmacy contact our office and allow 72 hours.    Due to Covid, you will  need to wear a mask upon entering the hospital. If you do not have a mask, a mask will be given to you at the Main Entrance upon arrival. For doctor visits, patients may have 1 support person age 63 or older with them. For treatment visits, patients can not have anyone with them due to social distancing guidelines and our immunocompromised population.

## 2023-06-30 NOTE — Progress Notes (Signed)
 [facility] [address], [place] [address]    Clinic Day:  06/30/2023  Referring physician: Eliodoro Guerin, DO  Patient Care Team: Eliodoro Guerin, DO as PCP - General (Family Medicine) Kenith Payer, MD as Consulting Physician (Radiation Oncology) Paulett Boros, MD as Consulting Physician (Hematology) Vinetta Greening, DO as Consulting Physician (Gastroenterology) Jane Meager, MD as Consulting Physician (Nephrology)   ASSESSMENT & PLAN:   Assessment: 1.  Stage IIIc inflammatory right breast cancer, triple negative: - Presentation with positive supraclavicular, infraclavicular, axillary nodes with eroding mass in the upper outer quadrant of the right breast, 12 x 11.5 cm, status post FEC x5 cycles, followed by carboplatin and Taxotere for 4 cycles, followed by bilateral mastectomies, Y PT 0, ypN0 on 02/23/2009. -Completed XRT in March 2011.   2.  Left upper extremity DVT: -Port-A-Cath induced and was treated for 6 months.  Port was removed.   3.  Fatty liver:  -Previous ultrasound showed fatty liver.     Plan: 1.  Stage IIIc inflammatory right breast cancer, triple negative: - She denies any new onset pains.  She has lost her husband about 5 days ago. - Physical exam: Bilateral mastectomy sites are within normal limits.  No palpable adenopathy.  Right upper extremity lymphedema is stable. - Labs from 06/23/2023: Normal LFTs.  CBC grossly normal. - She would like to continue follow-ups in our clinic.  She will come back in a year with repeat labs and exam.   2.  Vitamin D  deficiency: - Previous vitamin D  level is normal.  She is not on any supplements.    Orders Placed This Encounter  Procedures   CBC with Differential    Standing Status:   Future    Expected Date:   06/21/2024    Expiration Date:   06/29/2024   Comprehensive metabolic panel    Standing Status:   Future    Expected Date:   06/21/2024    Expiration Date:    06/29/2024     Wendy Gilbert,acting as a scribe for Paulett Boros, MD.,have documented all relevant documentation on the behalf of Paulett Boros, MD,as directed by  Paulett Boros, MD while in the presence of Paulett Boros, MD.  I, Paulett Boros MD, have reviewed the above documentation for accuracy and completeness, and I agree with the above.    Paulett Boros, MD   5/19/20253:50 PM  CHIEF COMPLAINT:   Diagnosis: right breast cancer    Cancer Staging  No matching staging information was found for the patient.    Prior Therapy: -FEC x5 cycles followed by carboplatin and Taxotere for 4 cycles -Bilateral mastectomy, YPT0Y PN 0 on 02/23/2009 -Completed XRT in March 2011  Current Therapy:  surveillance   HISTORY OF PRESENT ILLNESS:   Oncology History  Inflammatory carcinoma of right breast (Resolved)  08/01/2010 Initial Diagnosis   Inflammatory carcinoma of right breast      INTERVAL HISTORY:   Wendy Gilbert is a 63 y.o. female presenting to clinic today for follow up of right breast cancer. She was last seen by me on 07/04/22.  Today, she states that she is doing well overall. Her appetite level is at 100%. Her energy level is at 50%.  PAST MEDICAL HISTORY:   Past Medical History: Past Medical History:  Diagnosis Date   Arthritis    "all over"   Diabetes mellitus without complication (HCC)    DVT (deep venous thrombosis) (HCC) 10/2008   left  arm   GERD (gastroesophageal reflux disease)    Headache(784.0)    sinus   Inflammatory carcinoma of right breast dx'd 07/2008   PONV (postoperative nausea and vomiting)    Seasonal allergies    Sinusitis    Vitamin D  deficiency 04/13/2014    Surgical History: Past Surgical History:  Procedure Laterality Date   BIOPSY  12/13/2019   Procedure: BIOPSY;  Surgeon: Vinetta Greening, DO;  [facility]: [facility];  Service: Endoscopy;;  gastric polyps   CESAREAN SECTION  09/1999   COLONOSCOPY WITH  PROPOFOL  N/A 12/13/2019   Procedure: COLONOSCOPY WITH PROPOFOL ;  Surgeon: Vinetta Greening, DO;  Location: [facility];  Service: Endoscopy;  Laterality: N/A;  12:30pm- patient will arrive as soon as she can.   DILATION AND CURETTAGE OF UTERUS  1978   ESOPHAGOGASTRODUODENOSCOPY (EGD) WITH PROPOFOL  N/A 12/13/2019   Procedure: ESOPHAGOGASTRODUODENOSCOPY (EGD) WITH PROPOFOL ;  Surgeon: Vinetta Greening, DO;  Location: [facility];  Service: Endoscopy;  Laterality: N/A;   INCISION AND DRAINAGE ABSCESS  10/25/2008   and debridement left axillary abscess, abd. wall abscess, left thigh abscess   KNEE ARTHROSCOPY Left 11/1996   MASTECTOMY Bilateral 02/23/2009   right modified radical, left simple   MASTECTOMY  2011    bilateral    POLYPECTOMY  12/13/2019   Procedure: POLYPECTOMY;  Surgeon: Vinetta Greening, DO;  Location: [facility];  Service: Endoscopy;;   PORT-A-CATH REMOVAL Left 05/07/2012   Procedure: REMOVAL PORT-A-CATH;  Surgeon: Harlee Lichtenstein, MD;  Location: [facility];  Service: General;  Laterality: Left;   PORTACATH PLACEMENT  08/09/2008   TOTAL KNEE ARTHROPLASTY Left 08/10/2013   Procedure: LEFT TOTAL KNEE ARTHROPLASTY;  Surgeon: Florencia Hunter, MD;  Location: [facility];  Service: Orthopedics;  Laterality: Left;   TUBAL LIGATION      Social History: Social History   Socioeconomic History   Marital status: Married    Spouse name: Alayne Hubert   Number of children: 2   Years of education: 12   Highest education level: Some college, no degree  Occupational History   Occupation: disabled  Tobacco Use   Smoking status: Never   Smokeless tobacco: Never  Vaping Use   Vaping status: Never Used  Substance and Sexual Activity   Alcohol use: No   Drug use: No   Sexual activity: Not Currently    Birth control/protection: Surgical, Post-menopausal    Comment: tubal  Other Topics Concern   Not on file  Social History Narrative   One daughter lives with them   Social  Drivers of Health   Financial Resource Strain: Medium Risk (09/11/2022)   Overall Financial Resource Strain (CARDIA)    Difficulty of Paying Living Expenses: Somewhat hard  Food Insecurity: No Food Insecurity (10/25/2022)   Hunger Vital Sign    Worried About Running Out of Food in the Last Year: Never true    Ran Out of Food in the Last Year: Never true  Transportation Needs: No Transportation Needs (10/25/2022)   PRAPARE - Administrator, Civil Service (Medical): No    Lack of Transportation (Non-Medical): No  Physical Activity: Inactive (09/11/2022)   Exercise Vital Sign    Days of Exercise per Week: 0 days    Minutes of Exercise per Session: 0 min  Stress: No Stress Concern Present (09/11/2022)   [employer] of Occupational Health - Occupational Stress Questionnaire    Feeling of Stress : Only a little  Social Connections: Moderately Isolated (09/11/2022)   Social Connection and Isolation Panel [NHANES]    Frequency of Communication with Friends and Family: Twice a week    Frequency of Social Gatherings with Friends and Family: Once a week    Attends Religious Services: Never    Database administrator or Organizations: No    Attends Banker Meetings: Never    Marital Status: Married  Catering manager Violence: Not At Risk (09/11/2022)   Humiliation, Afraid, Rape, and Kick questionnaire    Fear of Current or Ex-Partner: No    Emotionally Abused: No    Physically Abused: No    Sexually Abused: No    Family History: Family History  Problem Relation Age of Onset   Breast cancer Mother    Cancer Mother        breast, colon, ovarian   Heart disease Father    Cancer Paternal Uncle        pancreatic   Deep vein thrombosis Brother    Aortic aneurysm Brother    ADD / ADHD Daughter    Asthma Daughter    Diabetes Maternal Grandmother    Stroke Maternal Grandmother    Hypothyroidism Maternal Grandfather    Heart disease Maternal Grandfather     Anuerysm Paternal Grandmother    Heart attack Paternal Grandfather     Current Medications:  Current Outpatient Medications:    acetaminophen  (TYLENOL ) 650 MG CR tablet, Take 1,300 mg by mouth at bedtime., Disp: , Rfl:    buPROPion  ER (WELLBUTRIN  SR) 100 MG 12 hr tablet, Take 1 tablet (100 mg total) by mouth in the morning. (Patient not taking: Reported on 06/30/2023), Disp: 90 tablet, Rfl: 0   celecoxib  (CELEBREX ) 50 MG capsule, Take 1 capsule (50 mg total) by mouth 2 (two) times daily as needed for pain (take with food). (Patient not taking: Reported on 06/30/2023), Disp: 180 capsule, Rfl: 0   doxycycline  (VIBRA -TABS) 100 MG tablet, Take 1 tablet (100 mg total) by mouth 2 (two) times daily. (Patient not taking: Reported on 06/30/2023), Disp: 42 tablet, Rfl: 0   fluticasone  (FLONASE ) 50 MCG/ACT nasal spray, Place 2 sprays into both nostrils daily., Disp: 16 g, Rfl: PRN   hydrOXYzine  (VISTARIL ) 25 MG capsule, Take 1 capsule (25 mg total) by mouth every 8 (eight) hours as needed., Disp: 60 capsule, Rfl: 0   lidocaine  (LIDODERM ) 5 %, Place 1 patch onto the skin daily. Remove & Discard patch within 12 hours or as directed by MD, Disp: 30 patch, Rfl: PRN   Loratadine (CLARITIN PO), Take by mouth., Disp: , Rfl:    mupirocin  ointment (BACTROBAN ) 2 %, Apply 1 Application topically 3 (three) times daily., Disp: , Rfl:    pantoprazole  (PROTONIX ) 40 MG tablet, TAKE 1 TABLET BY MOUTH DAILY. MAY TAKE AN EXTRA TABLET IN THE EVENING IF NEEDED., Disp: 135 tablet, Rfl: 3   pravastatin  (PRAVACHOL ) 40 MG tablet, Take 1 tablet (40 mg total) by mouth daily. For cholesterol. To replace the Rosuvastatin  (Patient not taking: Reported on 06/30/2023), Disp: 90 tablet, Rfl: 3   sulfamethoxazole -trimethoprim  (BACTRIM  DS) 800-160 MG tablet, Take 1 tablet by mouth 2 (two) times daily. (Patient not taking: Reported on 06/30/2023), Disp: 14 tablet, Rfl: 0   triamterene -hydrochlorothiazide (MAXZIDE) 75-50 MG tablet, Take 1 tablet by  mouth daily., Disp: 90 tablet, Rfl: 3   Allergies: Allergies  Allergen Reactions   Demerol  Other (See Comments)    MENTAL STATUS CHANGE   Erythromycin Nausea And Vomiting  Other Other (See Comments)   Adhesive [Tape] Rash   Cephalexin Hives and Other (See Comments)    ABD. PAIN   Penicillins Other (See Comments)    UNKNOWN - WAS AS A CHILD    REVIEW OF SYSTEMS:   Review of Systems  Constitutional:  Positive for fatigue. Negative for chills and fever.  HENT:   Negative for lump/mass, mouth sores, nosebleeds, sore throat and trouble swallowing.   Eyes:  Negative for eye problems.  Respiratory:  Negative for cough and shortness of breath.   Cardiovascular:  Negative for chest pain, leg swelling and palpitations.  Gastrointestinal:  Negative for abdominal pain, constipation, diarrhea, nausea and vomiting.  Genitourinary:  Negative for bladder incontinence, difficulty urinating, dysuria, frequency, hematuria and nocturia.   Musculoskeletal:  Positive for arthralgias (Shoulders). Negative for back pain, flank pain, myalgias and neck pain.  Skin:  Negative for itching and rash.  Neurological:  Negative for dizziness, headaches and numbness.  Hematological:  Does not bruise/bleed easily.  Psychiatric/Behavioral:  Negative for depression, sleep disturbance and suicidal ideas. The patient is not nervous/anxious.   All other systems reviewed and are negative.    VITALS:   Blood pressure 118/83, pulse 94, temperature 99.2 F (37.3 C), temperature source Tympanic, resp. rate 20, weight 195 lb 1.7 oz (88.5 kg), SpO2 98%.  Wt Readings from Last 3 Encounters:  06/30/23 195 lb 1.7 oz (88.5 kg)  04/18/23 191 lb 9.6 oz (86.9 kg)  03/11/23 180 lb 12.8 oz (82 kg)    Body mass index is 33.49 kg/m.  Performance status (ECOG): 1 - Symptomatic but completely ambulatory  PHYSICAL EXAM:   Physical Exam Vitals and nursing note reviewed. Exam conducted with a chaperone present.   Constitutional:      Appearance: Normal appearance.  Cardiovascular:     Rate and Rhythm: Normal rate and regular rhythm.     Pulses: Normal pulses.     Heart sounds: Normal heart sounds.  Pulmonary:     Effort: Pulmonary effort is normal.     Breath sounds: Normal breath sounds.  Abdominal:     Palpations: Abdomen is soft. There is no hepatomegaly, splenomegaly or mass.     Tenderness: There is no abdominal tenderness.  Musculoskeletal:     Right lower leg: No edema.     Left lower leg: No edema.  Lymphadenopathy:     Cervical: No cervical adenopathy.     Right cervical: No superficial, deep or posterior cervical adenopathy.    Left cervical: No superficial, deep or posterior cervical adenopathy.     Upper Body:     Right upper body: No supraclavicular or axillary adenopathy.     Left upper body: No supraclavicular or axillary adenopathy.  Neurological:     General: No focal deficit present.     Mental Status: She is alert and oriented to person, place, and time.  Psychiatric:        Mood and Affect: Mood normal.        Behavior: Behavior normal.     LABS:      Latest Ref Rng & Units 06/23/2023    2:57 PM 06/27/2022    3:03 PM 11/23/2021    4:27 PM  CBC  WBC 4.0 - 10.5 K/uL 5.7  7.0  5.9   Hemoglobin 12.0 - 15.0 g/dL 86.5  78.4  69.6   Hematocrit 36.0 - 46.0 % 36.7  41.5  40.3   Platelets 150 - 400 K/uL PLATELET CLUMPS NOTED  ON SMEAR, COUNT APPEARS ADEQUATE  311  323       Latest Ref Rng & Units 06/23/2023    2:57 PM 03/06/2023    5:08 PM 06/27/2022    3:03 PM  CMP  Glucose 70 - 99 mg/dL 161   096   BUN 8 - 23 mg/dL 22   27   Creatinine 0.45 - 1.00 mg/dL 4.09  8.11  9.14   Sodium 135 - 145 mmol/L 137   137   Potassium 3.5 - 5.1 mmol/L 3.6   3.9   Chloride 98 - 111 mmol/L 104   100   CO2 22 - 32 mmol/L 25   27   Calcium  8.9 - 10.3 mg/dL 9.2   9.4   Total Protein 6.5 - 8.1 g/dL 7.2   8.0   Total Bilirubin 0.0 - 1.2 mg/dL 0.9   0.7   Alkaline Phos 38 - 126 U/L  66   67   AST 15 - 41 U/L 24   20   ALT 0 - 44 U/L 15   16      Lab Results  Component Value Date   CEA <0.5 09/24/2013   /  CEA  Date Value Ref Range Status  09/24/2013 <0.5 0.0 - 5.0 ng/mL Final    Comment:    Performed at [facility]   No results found for: "PSA1" No results found for: "CAN199" No results found for: "CAN125"  No results found for: "TOTALPROTELP", "ALBUMINELP", "A1GS", "A2GS", "BETS", "BETA2SER", "GAMS", "MSPIKE", "SPEI" Lab Results  Component Value Date   FERRITIN 28 09/09/2011   No results found for: "LDH"   STUDIES:   No results found.

## 2023-07-10 ENCOUNTER — Ambulatory Visit (INDEPENDENT_AMBULATORY_CARE_PROVIDER_SITE_OTHER)

## 2023-07-10 VITALS — BP 118/83 | HR 94 | Ht 64.0 in | Wt 195.0 lb

## 2023-07-10 DIAGNOSIS — Z0000 Encounter for general adult medical examination without abnormal findings: Secondary | ICD-10-CM

## 2023-07-10 NOTE — Progress Notes (Signed)
 Subjective:   Wendy Gilbert is a 63 y.o. who presents for a Medicare Wellness preventive visit.  As a reminder, Annual Wellness Visits don't include a physical exam, and some assessments may be limited, especially if this visit is performed virtually. We may recommend an in-person follow-up visit with your provider if needed.  Visit Complete: Virtual I connected with  Wendy Gilbert on 07/10/23 by a audio enabled telemedicine application and verified that I am speaking with the correct person using two identifiers.  Patient Location: Home  Provider Location: Home Office  I discussed the limitations of evaluation and management by telemedicine. The patient expressed understanding and agreed to proceed.  Vital Signs: Because this visit was a virtual/telehealth visit, some criteria may be missing or patient reported. Any vitals not documented were not able to be obtained and vitals that have been documented are patient reported.  VideoDeclined- This patient declined Librarian, academic. Therefore the visit was completed with audio only.  Persons Participating in Visit: Patient.  AWV Questionnaire: No: Patient Medicare AWV questionnaire was not completed prior to this visit.  Cardiac Risk Factors include: advanced age (>28men, >43 women);obesity (BMI >30kg/m2);dyslipidemia;hypertension     Objective:     Today's Vitals   07/10/23 1434  BP: 118/83  Pulse: 94  Weight: 195 lb (88.5 kg)  Height: 5\' 4"  (1.626 m)  PainSc: 3    Body mass index is 33.47 kg/m.     07/10/2023    2:47 PM 07/10/2023    2:45 PM 06/30/2023    3:10 PM 03/20/2023    2:27 PM 07/05/2022    2:24 PM 07/04/2022    3:26 PM 07/03/2021   12:00 PM  Advanced Directives  Does Patient Have a Medical Advance Directive? No No No No No No No  Would patient like information on creating a medical advance directive?    No - Patient declined No - Patient declined No - Patient declined No - Patient  declined    Current Medications (verified) Outpatient Encounter Medications as of 07/10/2023  Medication Sig   acetaminophen  (TYLENOL ) 650 MG CR tablet Take 1,300 mg by mouth at bedtime.   fluticasone  (FLONASE ) 50 MCG/ACT nasal spray Place 2 sprays into both nostrils daily.   hydrOXYzine  (VISTARIL ) 25 MG capsule Take 1 capsule (25 mg total) by mouth every 8 (eight) hours as needed.   Loratadine (CLARITIN PO) Take by mouth.   pantoprazole  (PROTONIX ) 40 MG tablet TAKE 1 TABLET BY MOUTH DAILY. MAY TAKE AN EXTRA TABLET IN THE EVENING IF NEEDED.   triamterene -hydrochlorothiazide (MAXZIDE) 75-50 MG tablet Take 1 tablet by mouth daily.   buPROPion  ER (WELLBUTRIN  SR) 100 MG 12 hr tablet Take 1 tablet (100 mg total) by mouth in the morning. (Patient not taking: Reported on 07/10/2023)   celecoxib  (CELEBREX ) 50 MG capsule Take 1 capsule (50 mg total) by mouth 2 (two) times daily as needed for pain (take with food). (Patient not taking: Reported on 07/10/2023)   doxycycline  (VIBRA -TABS) 100 MG tablet Take 1 tablet (100 mg total) by mouth 2 (two) times daily. (Patient not taking: Reported on 07/10/2023)   lidocaine  (LIDODERM ) 5 % Place 1 patch onto the skin daily. Remove & Discard patch within 12 hours or as directed by MD (Patient not taking: Reported on 07/10/2023)   mupirocin  ointment (BACTROBAN ) 2 % Apply 1 Application topically 3 (three) times daily. (Patient not taking: Reported on 07/10/2023)   pravastatin  (PRAVACHOL ) 40 MG tablet Take 1 tablet (  40 mg total) by mouth daily. For cholesterol. To replace the Rosuvastatin  (Patient not taking: Reported on 07/10/2023)   sulfamethoxazole -trimethoprim  (BACTRIM  DS) 800-160 MG tablet Take 1 tablet by mouth 2 (two) times daily. (Patient not taking: Reported on 07/10/2023)   No facility-administered encounter medications on file as of 07/10/2023.    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   DILATION AND CURETTAGE OF UTERUS  1978   ESOPHAGOGASTRODUODENOSCOPY (EGD) WITH PROPOFOL  N/A 12/13/2019   Procedure: ESOPHAGOGASTRODUODENOSCOPY (EGD) WITH PROPOFOL ;  Surgeon: Vinetta Greening, DO;  Location: [facility];  Service: Endoscopy;  Laterality: N/A;   INCISION AND DRAINAGE ABSCESS  10/25/2008   and debridement left axillary abscess, abd. wall abscess, left thigh abscess   KNEE ARTHROSCOPY Left 11/1996   MASTECTOMY Bilateral 02/23/2009   right modified radical, left simple   MASTECTOMY  2011    bilateral    POLYPECTOMY  12/13/2019   Procedure: POLYPECTOMY;  Surgeon: Vinetta Greening, DO;  Location: [facility];  Service: Endoscopy;;   PORT-A-CATH REMOVAL Left 05/07/2012   Procedure: REMOVAL PORT-A-CATH;  Surgeon: Harlee Lichtenstein, MD;  Location: [facility];  Service: General;  Laterality: Left;   PORTACATH PLACEMENT  08/09/2008   TOTAL KNEE ARTHROPLASTY Left 08/10/2013   Procedure: LEFT TOTAL KNEE ARTHROPLASTY;  Surgeon: Florencia Hunter, MD;  Location: [facility];  Service: Orthopedics;  Laterality: Left;   TUBAL LIGATION      Family History  Problem Relation Age of Onset   Breast cancer Mother    Cancer Mother        breast, colon, ovarian   Heart disease Father    Cancer Paternal Uncle        pancreatic   Deep vein thrombosis Brother    Aortic aneurysm Brother    ADD / ADHD Daughter    Asthma Daughter    Diabetes Maternal Grandmother    Stroke Maternal Grandmother    Hypothyroidism Maternal Grandfather    Heart disease Maternal Grandfather    Anuerysm Paternal Grandmother    Heart attack Paternal Grandfather    Social History   Socioeconomic History   Marital status: Married    Spouse name: Alayne Hubert   Number of children: 2   Years of education: 12   Highest education level: Some college, no degree  Occupational History   Occupation: disabled  Tobacco Use   Smoking status: Never   Smokeless tobacco: Never  Vaping Use   Vaping status: Never Used  Substance and Sexual  Activity   Alcohol use: No   Drug use: No   Sexual activity: Not Currently    Birth control/protection: Surgical, Post-menopausal    Comment: tubal  Other Topics Concern   Not on file  Social History Narrative   One daughter lives with them   Social Drivers of Health   Financial Resource Strain: Low Risk  (07/10/2023)   Overall Financial Resource Strain (CARDIA)    Difficulty of Paying Living Expenses: Not hard at all  Food Insecurity: No Food Insecurity (07/10/2023)   Hunger Vital Sign    Worried About Running Out of Food in the Last Year: Never true    Ran Out of Food in the Last Year: Never true  Transportation Needs: No Transportation Needs (07/10/2023)   PRAPARE - Administrator, Civil Service (Medical): No    Lack of Transportation (Non-Medical): No  Physical Activity: Insufficiently Active (07/10/2023)   Exercise Vital Sign    Days of Exercise per Week: 7 days    Minutes of Exercise per Session: 20 min  Stress: No Stress Concern Present (07/10/2023)   [employer] of Occupational Health -  Occupational Stress Questionnaire    Feeling of Stress : Only a little  Social Connections: Socially Isolated (07/10/2023)   Social Connection and Isolation Panel [NHANES]    Frequency of Communication with Friends and Family: More than three times a week    Frequency of Social Gatherings with Friends and Family: More than three times a week    Attends Religious Services: Never    Database administrator or Organizations: No    Attends Banker Meetings: Never    Marital Status: Widowed    Tobacco Counseling Counseling given: Yes    Clinical Intake:  Pre-visit preparation completed: Yes  Pain : 0-10 (R-shoulder) Pain Score: 3  Pain Type: Chronic pain Pain Location: Shoulder Pain Orientation: Right Pain Descriptors / Indicators: Aching Pain Onset: Other (comment) Pain Frequency: Constant Pain Relieving Factors: Excedrin  Pain Relieving Factors: Excedrin  BMI - recorded: 33.47 Nutritional Status: BMI > 30  Obese Nutritional Risks: None Diabetes: No  Lab Results  Component Value Date   HGBA1C 6.4 (H) 04/18/2023   HGBA1C 7.0 (H) 11/27/2022   HGBA1C 6.5 (H) 05/27/2022     How often do you need to have someone help you when you read instructions, pamphlets, or other written materials from your doctor or pharmacy?: 1 - Never  Interpreter Needed?: No  Information entered by :: Alia T/cma   Activities of Daily Living     07/10/2023    2:43 PM  In your present state of health, do you have any difficulty performing the following activities:  Hearing? 0  Vision? 0  Difficulty concentrating or making decisions? 0  Walking or climbing stairs? 0  Dressing or bathing? 0  Doing errands, shopping? 0  Preparing Food and eating ? N  Using the Toilet? N  In the past six months, have you accidently leaked urine? N  Do you have problems with loss of bowel control? N  Managing your Medications? N  Managing your Finances? N  Housekeeping or managing your  Housekeeping? N    Patient Care Team: Eliodoro Guerin, DO as PCP - General (Family Medicine) Kenith Payer, MD as Consulting Physician (Radiation Oncology) Paulett Boros, MD as Consulting Physician (Hematology) Vinetta Greening, DO as Consulting Physician (Gastroenterology) Jane Meager, MD as Consulting Physician (Nephrology)  Indicate any recent Medical Services you  may have received from other than [facility] providers in the past year (date may be approximate).     Assessment:    This is a routine wellness examination for Wendy Gilbert.  Hearing/Vision screen Hearing Screening - Comments:: Pt denies hearing dif Vision Screening - Comments:: Pt wear glasses/pt haven't had an eye check in a while   Goals Addressed             This Visit's Progress    Patient Stated       Making sure that all the bills are paid for       Depression Screen     07/10/2023    2:50 PM 05/27/2023   10:52 AM 04/18/2023   11:06 AM 03/11/2023    3:35 PM 03/11/2023    3:14 PM 03/06/2023    2:28 PM 11/27/2022    9:18 AM  PHQ 2/9 Scores  PHQ - 2 Score 1 0 0 0 0 0 0  PHQ- 9 Score 6  0  0 0 2    Fall Risk     07/10/2023    2:59 PM 05/27/2023   10:52 AM 05/15/2023    3:07 PM 04/18/2023   11:06 AM 03/11/2023    3:35 PM  Fall Risk   Falls in the past year? 0 0 0 0 0  Number falls in past yr: 0 0 0 0   Injury with Fall? 0 0 0 0   Risk for fall due to : No Fall Risks No Fall Risks No Fall Risks No Fall Risks   Follow up Falls evaluation completed Falls evaluation completed Falls evaluation completed Falls evaluation completed     MEDICARE RISK AT HOME:  Medicare Risk at Home Any stairs in or around the home?: Yes If so, are there any without handrails?: Yes Home free of loose throw rugs in walkways, pet beds, electrical cords, etc?: Yes Adequate lighting in your home to reduce risk of falls?: Yes Life alert?: No Use of a cane, walker or w/c?: No Grab bars in the bathroom?: Yes Shower  chair or bench in shower?: Yes Elevated toilet seat or a handicapped toilet?: No  TIMED UP AND GO:  Was the test performed?  no  Cognitive Function: 6CIT completed        07/10/2023    2:52 PM 07/05/2022    2:25 PM 07/03/2021   12:01 PM 06/28/2020    2:38 PM 06/28/2019    2:46 PM  6CIT Screen  What Year? 0 points 0 points 0 points  0 points  What month? 0 points 0 points 0 points  0 points  What time? 0 points 0 points 0 points 0 points 0 points  Count back from 20 0 points 0 points 0 points 0 points 0 points  Months in reverse 0 points 0 points 0 points 0 points 0 points  Repeat phrase 0 points 0 points 0 points 0 points 0 points  Total Score 0 points 0 points 0 points  0 points    Immunizations Immunization History  Administered Date(s) Administered   Influenza, Seasonal, Injecte, Preservative Fre 11/27/2022   Influenza,inj,Quad PF,6+ Mos 11/27/2012, 02/17/2020, 02/21/2021, 11/23/2021   Influenza-Unspecified 12/16/2013, 12/28/2014   Moderna Sars-Covid-2 Vaccination 05/07/2019, 06/09/2019   Pneumococcal Conjugate-13 08/21/2020   Tdap 02/21/2021   Zoster Recombinant(Shingrix ) 08/22/2021    Screening Tests Health Maintenance  Topic Date Due   Pneumonia Vaccine 65+ Years old (2 of 2 - PPSV23) 10/16/2020   Zoster Vaccines- Shingrix  (2  of 2) 10/17/2021   Diabetic kidney evaluation - Urine ACR  05/27/2023   FOOT EXAM  05/27/2023   Hepatitis C Screening  11/27/2023 (Originally 07/05/1978)   HIV Screening  11/27/2023 (Originally 07/05/1975)   COVID-19 Vaccine (3 - Moderna risk series) 07/25/2024 (Originally 07/07/2019)   INFLUENZA VACCINE  09/12/2023   HEMOGLOBIN A1C  10/19/2023   OPHTHALMOLOGY EXAM  12/17/2023   Diabetic kidney evaluation - eGFR measurement  06/22/2024   Medicare Annual Wellness (AWV)  07/09/2024   DEXA SCAN  09/04/2025   Colonoscopy  12/13/2026   Cervical Cancer Screening (HPV/Pap Cotest)  09/11/2027   DTaP/Tdap/Td (2 - Td or Tdap) 02/22/2031   HPV  VACCINES  Aged Out   Meningococcal B Vaccine  Aged Out    Health Maintenance  Health Maintenance Due  Topic Date Due   Pneumonia Vaccine 84+ Years old (2 of 2 - PPSV23) 10/16/2020   Zoster Vaccines- Shingrix  (2 of 2) 10/17/2021   Diabetic kidney evaluation - Urine ACR  05/27/2023   FOOT EXAM  05/27/2023   Health Maintenance Items Addressed: See Nurse Notes  Additional Screening:  Vision Screening: Recommended annual ophthalmology exams for early detection of glaucoma and other disorders of the eye.  Dental Screening: Recommended annual dental exams for proper oral hygiene  Community Resource Referral / Chronic Care Management: CRR required this visit?  No   CCM required this visit?  No   Plan:    I have personally reviewed and noted the following in the patient's chart:   Medical and social history Use of alcohol, tobacco or illicit drugs  Current medications and supplements including opioid prescriptions. Patient is not currently taking opioid prescriptions. Functional ability and status Nutritional status Physical activity Advanced directives List of other physicians Hospitalizations, surgeries, and ER visits in previous 12 months Vitals Screenings to include cognitive, depression, and falls Referrals and appointments  In addition, I have reviewed and discussed with patient certain preventive protocols, quality metrics, and best practice recommendations. A written personalized care plan for preventive services as well as general preventive health recommendations were provided to patient.   Michaelle Adolphus, CMA   07/10/2023   After Visit Summary: (MyChart) Due to this being a telephonic visit, the after visit summary with patients personalized plan was offered to patient via MyChart   Notes: Pt is aware she due for the following: 2nd Shingles vaccine - Per pt declined, Pnuemonia vaccine, foot exam and Urine ACR

## 2023-07-10 NOTE — Patient Instructions (Signed)
 Ms. Wendy Gilbert , Thank you for taking time out of your busy schedule to complete your Annual Wellness Visit with me. I enjoyed our conversation and look forward to speaking with you again next year. I, as well as your care team,  appreciate your ongoing commitment to your health goals. Please review the following plan we discussed and let me know if I can assist you in the future. Your Game plan/ To Do List    Follow up Visits: Next Medicare AWV with our clinical staff: 07/12/24 at 2:30p.m.   Next Office Visit with your provider: 08/27/23 at 9:15a.m.  Clinician Recommendations:  Aim for 30 minutes of exercise or brisk walking, 6-8 glasses of water , and 5 servings of fruits and vegetables each day. N/a      This is a list of the screening recommended for you and due dates:  Health Maintenance  Topic Date Due   Pneumonia Vaccine (2 of 2 - PPSV23) 10/16/2020   Zoster (Shingles) Vaccine (2 of 2) 10/17/2021   Yearly kidney health urinalysis for diabetes  05/27/2023   Complete foot exam   05/27/2023   Hepatitis C Screening  11/27/2023*   HIV Screening  11/27/2023*   COVID-19 Vaccine (3 - Moderna risk series) 07/25/2024*   Flu Shot  09/12/2023   Hemoglobin A1C  10/19/2023   Eye exam for diabetics  12/17/2023   Yearly kidney function blood test for diabetes  06/22/2024   Medicare Annual Wellness Visit  07/09/2024   DEXA scan (bone density measurement)  09/04/2025   Colon Cancer Screening  12/13/2026   Pap with HPV screening  09/11/2027   DTaP/Tdap/Td vaccine (2 - Td or Tdap) 02/22/2031   HPV Vaccine  Aged Out   Meningitis B Vaccine  Aged Out  *Topic was postponed. The date shown is not the original due date.    Advanced directives: (Declined) Advance directive discussed with you today. Even though you declined this today, please call our office should you change your mind, and we can give you the proper paperwork for you to fill out. Advance Care Planning is important because it:  [x]  Makes sure  you receive the medical care that is consistent with your values, goals, and preferences  [x]  It provides guidance to your family and loved ones and reduces their decisional burden about whether or not they are making the right decisions based on your wishes.  Follow the link provided in your after visit summary or read over the paperwork we have mailed to you to help you started getting your Advance Directives in place. If you need assistance in completing these, please reach out to us  so that we can help you!  See attachments for Preventive Care and Fall Prevention Tips.

## 2023-07-18 DIAGNOSIS — B349 Viral infection, unspecified: Secondary | ICD-10-CM | POA: Diagnosis not present

## 2023-07-18 DIAGNOSIS — R051 Acute cough: Secondary | ICD-10-CM | POA: Diagnosis not present

## 2023-07-18 DIAGNOSIS — R070 Pain in throat: Secondary | ICD-10-CM | POA: Diagnosis not present

## 2023-07-18 DIAGNOSIS — R69 Illness, unspecified: Secondary | ICD-10-CM | POA: Diagnosis not present

## 2023-07-18 DIAGNOSIS — Z20822 Contact with and (suspected) exposure to covid-19: Secondary | ICD-10-CM | POA: Diagnosis not present

## 2023-07-21 ENCOUNTER — Ambulatory Visit: Payer: Self-pay

## 2023-07-21 NOTE — Telephone Encounter (Signed)
 FYI Only or Action Required?: Action required by provider  Patient was last seen in primary care on 05/27/2023 by Eliodoro Guerin, DO. Called Nurse Triage reporting Sinusitis. Symptoms began several days ago. Interventions attempted: OTC medications: Advil. Symptoms are: gradually worsening.  Triage Disposition: See Physician Within 24 Hours  Patient/caregiver understands and will follow disposition?: YesCopied from CRM [id number]. Topic: Clinical - Red Word Triage >> Jul 21, 2023 11:05 AM Wendy Gilbert wrote: Red Word that prompted transfer to Nurse Triage:  patient has congestion, diarrhea, fever 99.2, body aches , coughing up yellow stuff.  Urgent care on 07/18/23  patient is feeling worse Reason for Disposition  Fever present > 3 days (72 hours)  Answer Assessment - Initial Assessment Questions 1. LOCATION: "Where does it hurt?"      Headache 2. ONSET: "When did the sinus pain start?"  (e.g., hours, days)      6/5 3. SEVERITY: "How bad is the pain?"   (Scale 1-10; mild, moderate or severe)   - MILD (1-3): doesn't interfere with normal activities    - MODERATE (4-7): interferes with normal activities (e.g., work or school) or awakens from sleep   - SEVERE (8-10): excruciating pain and patient unable to do any normal activities        mild 4. RECURRENT SYMPTOM: "Have you ever had sinus problems before?" If Yes, ask: "When was the last time?" and "What happened that time?"      Na  5. NASAL CONGESTION: "Is the nose blocked?" If Yes, ask: "Can you open it or must you breathe through your mouth?"     Na  6. NASAL DISCHARGE: "Do you have discharge from your nose?" If so ask, "What color?"     Yellow/blood tinge  7. FEVER: "Do you have a fever?" If Yes, ask: "What is it, how was it measured, and when did it start?"      99.2 8. OTHER SYMPTOMS: "Do you have any other symptoms?" (e.g., sore throat, cough, earache, difficulty breathing)     Bilateral earache; sore throat; diarrhea     Pt  started feeling bad on 6/5. Pt went to UC. All tests were negative and sent home. Since then , bilateral earache, has a fever, blood tinge nasal discharge.  Protocols used: Sinus Pain or Congestion-A-AH

## 2023-07-22 ENCOUNTER — Ambulatory Visit (INDEPENDENT_AMBULATORY_CARE_PROVIDER_SITE_OTHER): Admitting: Nurse Practitioner

## 2023-07-22 VITALS — BP 146/89 | HR 98 | Temp 97.5°F | Ht 64.0 in | Wt 196.0 lb

## 2023-07-22 DIAGNOSIS — J0100 Acute maxillary sinusitis, unspecified: Secondary | ICD-10-CM | POA: Diagnosis not present

## 2023-07-22 MED ORDER — DOXYCYCLINE HYCLATE 100 MG PO TABS: 100.0000 mg | ORAL_TABLET | Freq: Two times a day (BID) | ORAL | 0 refills | Status: DC

## 2023-07-22 MED ORDER — FLUTICASONE PROPIONATE 50 MCG/ACT NA SUSP: 2.0000 | Freq: Every day | NASAL | 6 refills | Status: AC

## 2023-07-22 NOTE — Progress Notes (Signed)
 Subjective:    Patient ID: LISETH WANN, female    DOB: 1960-04-29, 63 y.o.   MRN: [id number]   Chief Complaint: Nasal Congestion   Patient went to urgent care 3 days ago and tey tested her for flu and covid but were negative. Sent her home on no meds. Still not feeling better.   URI  This is a new problem. The problem has been waxing and waning. Maximum temperature: 99.9 yesterday. Associated symptoms include congestion, coughing, headaches, rhinorrhea and sinus pain. Pertinent negatives include no sore throat. Treatments tried: [facility] severe cold sand flu. The treatment provided mild relief.    Patient Active Problem List   Diagnosis Date Noted   Diet-controlled diabetes mellitus (HCC) 11/27/2022   Nasal turbinate hypertrophy 09/06/2021   Hyperlipidemia associated with type 2 diabetes mellitus (HCC) 02/21/2021   Multiple gastric polyps 01/13/2020   History of breast cancer 04/26/2019   Hypertension associated with diabetes (HCC) 04/26/2019   Gastroesophageal reflux disease without esophagitis 04/26/2019   Vitamin D  deficiency 04/13/2014   Morbid obesity (HCC) 08/10/2013   Total knee replacement status 08/10/2013       Review of Systems  Constitutional:  Positive for chills and fever (99.9).  HENT:  Positive for congestion, rhinorrhea and sinus pain. Negative for sore throat.   Respiratory:  Positive for cough.   Neurological:  Positive for headaches.       Objective:   Physical Exam Constitutional:      Appearance: Normal appearance.  HENT:     Right Ear: Tympanic membrane normal.     Left Ear: Tympanic membrane normal.     Nose: Congestion and rhinorrhea present.     Right Sinus: Maxillary sinus tenderness present.     Left Sinus: Maxillary sinus tenderness present.     Mouth/Throat:     Pharynx: No oropharyngeal exudate or posterior oropharyngeal erythema.  Cardiovascular:     Rate and Rhythm: Normal rate and regular rhythm.     Heart sounds: Normal heart  sounds.  Pulmonary:     Breath sounds: Normal breath sounds.  Skin:    General: Skin is warm.  Neurological:     General: No focal deficit present.     Mental Status: She is alert and oriented to person, place, and time.  Psychiatric:        Mood and Affect: Mood normal.        Behavior: Behavior normal.    BP (!) 146/89   Pulse 98   Temp (!) 97.5 F (36.4 C) (Temporal)   Ht 5\' 4"  (1.626 m)   Wt 196 lb (88.9 kg)   SpO2 94%   BMI 33.64 kg/m         Assessment & Plan:  Charie Pinkus Ewalt in today with chief complaint of Nasal Congestion   1. Acute non-recurrent maxillary sinusitis (Primary) 1. Take meds as prescribed 2. Use a cool mist humidifier especially during the winter months and when heat has been humid. 3. Use saline nose sprays frequently 4. Saline irrigations of the nose can be very helpful if done frequently.  * 4X daily for 1 week*  * Use of a nettie pot can be helpful with this. Follow directions with this* 5. Drink plenty of fluids 6. Keep thermostat turn down low 7.For any cough or congestion- mucinex as needed 8. For fever or aces or pains- take tylenol  or ibuprofen appropriate for age and weight.  * for fevers greater than 101 orally you may  alternate ibuprofen and tylenol  every  3 hours.    Meds ordered this encounter  Medications   doxycycline  (VIBRA -TABS) 100 MG tablet    Sig: Take 1 tablet (100 mg total) by mouth 2 (two) times daily. 1 po bid    Dispense:  20 tablet    Refill:  0    Supervising Provider:   DETTINGER, JOSHUA A [[id number]]   fluticasone  (FLONASE ) 50 MCG/ACT nasal spray    Sig: Place 2 sprays into both nostrils daily.    Dispense:  16 g    Refill:  6    Supervising Provider:   Jolyne Needs A [[id number]]      The above assessment and management plan was discussed with the patient. The patient verbalized understanding of and has agreed to the management plan. Patient is aware to call the clinic if symptoms persist or worsen.  Patient is aware when to return to the clinic for a follow-up visit. Patient educated on when it is appropriate to go to the emergency department.   Mary-Margaret Gaylyn Keas, FNP

## 2023-07-22 NOTE — Patient Instructions (Signed)

## 2023-08-07 ENCOUNTER — Other Ambulatory Visit: Payer: Self-pay

## 2023-08-07 DIAGNOSIS — N183 Chronic kidney disease, stage 3 unspecified: Secondary | ICD-10-CM

## 2023-08-27 ENCOUNTER — Ambulatory Visit: Admitting: Family Medicine

## 2023-08-27 ENCOUNTER — Encounter: Payer: Self-pay | Admitting: Family Medicine

## 2023-08-27 VITALS — BP 133/84 | HR 82 | Temp 98.7°F | Ht 64.0 in | Wt 195.0 lb

## 2023-08-27 DIAGNOSIS — E1159 Type 2 diabetes mellitus with other circulatory complications: Secondary | ICD-10-CM

## 2023-08-27 DIAGNOSIS — E785 Hyperlipidemia, unspecified: Secondary | ICD-10-CM

## 2023-08-27 DIAGNOSIS — L239 Allergic contact dermatitis, unspecified cause: Secondary | ICD-10-CM | POA: Diagnosis not present

## 2023-08-27 DIAGNOSIS — I152 Hypertension secondary to endocrine disorders: Secondary | ICD-10-CM

## 2023-08-27 DIAGNOSIS — N183 Chronic kidney disease, stage 3 unspecified: Secondary | ICD-10-CM | POA: Diagnosis not present

## 2023-08-27 DIAGNOSIS — E1122 Type 2 diabetes mellitus with diabetic chronic kidney disease: Secondary | ICD-10-CM

## 2023-08-27 DIAGNOSIS — F4320 Adjustment disorder, unspecified: Secondary | ICD-10-CM

## 2023-08-27 DIAGNOSIS — Z1159 Encounter for screening for other viral diseases: Secondary | ICD-10-CM

## 2023-08-27 DIAGNOSIS — E1169 Type 2 diabetes mellitus with other specified complication: Secondary | ICD-10-CM | POA: Diagnosis not present

## 2023-08-27 DIAGNOSIS — Z114 Encounter for screening for human immunodeficiency virus [HIV]: Secondary | ICD-10-CM

## 2023-08-27 MED ORDER — PREDNISONE 20 MG PO TABS: 40.0000 mg | ORAL_TABLET | Freq: Every day | ORAL | 0 refills | Status: AC

## 2023-08-27 MED ORDER — LEVOCETIRIZINE DIHYDROCHLORIDE 5 MG PO TABS: 5.0000 mg | ORAL_TABLET | Freq: Every evening | ORAL | 3 refills | Status: AC

## 2023-08-27 MED ORDER — METHYLPREDNISOLONE ACETATE 40 MG/ML IJ SUSP
40.0000 mg | Freq: Once | INTRAMUSCULAR | Status: AC
  Administered 2023-08-27: 40 mg via INTRAMUSCULAR

## 2023-08-27 NOTE — Progress Notes (Signed)
 Subjective: CC:DM PCP: [name], DO YEP:Wendy Gilbert is a 63 y.o. female presenting to clinic today for:  1. Type 2 Diabetes with hypertension, hyperlipidemia:  Diet controlled diabetes.  Compliant with Maxide for blood pressure control.  Not on a statin for cholesterol.  Diabetes Health Maintenance Due  Topic Date Due   FOOT EXAM  05/27/2023   HEMOGLOBIN A1C  10/19/2023   OPHTHALMOLOGY EXAM  12/17/2023    Last A1c:  Lab Results  Component Value Date   HGBA1C 6.4 (H) 04/18/2023    ROS: Denies dizziness, LOC, polyuria, polydipsia, unintended weight loss/gain, foot ulcerations, numbness or tingling in extremities, shortness of breath or chest pain.  2.  Rash She reports that she may have come in contact with some poison oak or poison ivy and also has been exposed to fleas.  She has multiple itchy rash areas on her ankles, torso and back.  Currently takes Claritin but wondering if she can start Xyzal  for as needed use.  3.  Grief reaction Since her last visit she lost her husband to complications of cirrhosis.  She is going to seek counseling services at [facility]  ROS: Per HPI  Allergies  Allergen Reactions   Demerol  Other (See Comments)    MENTAL STATUS CHANGE   Erythromycin Nausea And Vomiting   Other Other (See Comments)   Adhesive [Tape] Rash   Cephalexin Hives and Other (See Comments)    ABD. PAIN   Penicillins Other (See Comments)    UNKNOWN - WAS AS A CHILD   Past Medical History:  Diagnosis Date   Arthritis    all over   Diabetes mellitus without complication (HCC)    DVT (deep venous thrombosis) (HCC) 10/2008   left arm   GERD (gastroesophageal reflux disease)    Headache(784.0)    sinus   Inflammatory carcinoma of right breast dx'd 07/2008   PONV (postoperative nausea and vomiting)    Seasonal allergies    Sinusitis    Vitamin D  deficiency 04/13/2014    Current Outpatient Medications:    acetaminophen  (TYLENOL ) 650 MG CR tablet, Take  1,300 mg by mouth at bedtime., Disp: , Rfl:    doxycycline  (VIBRA -TABS) 100 MG tablet, Take 1 tablet (100 mg total) by mouth 2 (two) times daily. 1 po bid, Disp: 20 tablet, Rfl: 0   fluticasone  (FLONASE ) 50 MCG/ACT nasal spray, Place 2 sprays into both nostrils daily., Disp: 16 g, Rfl: 6   hydrOXYzine  (VISTARIL ) 25 MG capsule, Take 1 capsule (25 mg total) by mouth every 8 (eight) hours as needed., Disp: 60 capsule, Rfl: 0   Loratadine (CLARITIN PO), Take by mouth., Disp: , Rfl:    mupirocin  ointment (BACTROBAN ) 2 %, Apply 1 Application topically 3 (three) times daily., Disp: , Rfl:    pantoprazole  (PROTONIX ) 40 MG tablet, TAKE 1 TABLET BY MOUTH DAILY. MAY TAKE AN EXTRA TABLET IN THE EVENING IF NEEDED., Disp: 135 tablet, Rfl: 3   triamterene -hydrochlorothiazide (MAXZIDE) 75-50 MG tablet, Take 1 tablet by mouth daily., Disp: 90 tablet, Rfl: 3 Social History   Socioeconomic History   Marital status: Married    Spouse name: Ilah   Number of children: 2   Years of education: 12   Highest education level: Some college, no degree  Occupational History   Occupation: disabled  Tobacco Use   Smoking status: Never   Smokeless tobacco: Never  Vaping Use   Vaping status: Never Used  Substance and Sexual Activity   Alcohol  use: No   Drug use: No   Sexual activity: Not Currently    Birth control/protection: Surgical, Post-menopausal    Comment: tubal  Other Topics Concern   Not on file  Social History Narrative   One daughter lives with them   Social Drivers of Health   Financial Resource Strain: Medium Risk (07/22/2023)   Overall Financial Resource Strain (CARDIA)    Difficulty of Paying Living Expenses: Somewhat hard  Food Insecurity: No Food Insecurity (07/22/2023)   Hunger Vital Sign    Worried About Running Out of Food in the Last Year: Never true    Ran Out of Food in the Last Year: Never true  Transportation Needs: No Transportation Needs (07/22/2023)   PRAPARE - Therapist, art (Medical): No    Lack of Transportation (Non-Medical): No  Physical Activity: Inactive (07/22/2023)   Exercise Vital Sign    Days of Exercise per Week: 0 days    Minutes of Exercise per Session: 20 min  Stress: Stress Concern Present (07/22/2023)   [employer] of Occupational Health - Occupational Stress Questionnaire    Feeling of Stress : To some extent  Social Connections: Socially Isolated (07/22/2023)   Social Connection and Isolation Panel    Frequency of Communication with Friends and Family: More than three times a week    Frequency of Social Gatherings with Friends and Family: Once a week    Attends Religious Services: Never    Database administrator or Organizations: No    Attends Banker Meetings: Never    Marital Status: Widowed  Intimate Partner Violence: Not At Risk (07/10/2023)   Humiliation, Afraid, Rape, and Kick questionnaire    Fear of Current or Ex-Partner: No    Emotionally Abused: No    Physically Abused: No    Sexually Abused: No   Family History  Problem Relation Age of Onset   Breast cancer Mother    Cancer Mother        breast, colon, ovarian   Heart disease Father    Cancer Paternal Uncle        pancreatic   Deep vein thrombosis Brother    Aortic aneurysm Brother    ADD / ADHD Daughter    Asthma Daughter    Diabetes Maternal Grandmother    Stroke Maternal Grandmother    Hypothyroidism Maternal Grandfather    Heart disease Maternal Grandfather    Anuerysm Paternal Grandmother    Heart attack Paternal Grandfather     Objective: Office vital signs reviewed. BP 133/84   Pulse 82   Temp 98.7 F (37.1 C)   Ht 5' 4 (1.626 m)   Wt 195 lb (88.5 kg)   SpO2 95%   BMI 33.47 kg/m   Physical Examination:  General: Awake, alert, well nourished, No acute distress HEENT: sclera white, MMM Cardio: regular rate and rhythm, S1S2 heard, no murmurs appreciated Pulm: clear to auscultation bilaterally, no wheezes,  rhonchi or rales; normal work of breathing on room air Skin: Excoriated, minimally erythematous rash noted along the ankles and back  Diabetic Foot Exam - Simple   Simple Foot Form Diabetic Foot exam was performed with the following findings: Yes 08/27/2023 10:13 AM  Visual Inspection No deformities, no ulcerations, no other skin breakdown bilaterally: Yes Sensation Testing Intact to touch and monofilament testing bilaterally: Yes Pulse Check Posterior Tibialis and Dorsalis pulse intact bilaterally: Yes Comments       08/27/2023  9:13 AM 07/22/2023   12:24 PM 07/10/2023    2:50 PM  Depression screen PHQ 2/9  Decreased Interest 0 0 0  Down, Depressed, Hopeless 0 0 1  PHQ - 2 Score 0 0 1  Altered sleeping 0  1  Tired, decreased energy 0  3  Change in appetite 0  0  Feeling bad or failure about yourself  0  0  Trouble concentrating 0  1  Moving slowly or fidgety/restless 0  0  Suicidal thoughts 0  0  PHQ-9 Score 0  6  Difficult doing work/chores Not difficult at all  Not difficult at all      08/27/2023    9:13 AM 04/18/2023   11:05 AM 03/11/2023    3:15 PM 03/06/2023    2:29 PM  GAD 7 : Generalized Anxiety Score  Nervous, Anxious, on Edge 0 0 0 0  Control/stop worrying 0 0 0 0  Worry too much - different things 0 0 0 0  Trouble relaxing 0 0 0 0  Restless 0 0 0 0  Easily annoyed or irritable 0 0 0 0  Afraid - awful might happen 0 0 0 0  Total GAD 7 Score 0 0 0 0  Anxiety Difficulty Not difficult at all Not difficult at all  Not difficult at all    Assessment/ Plan: 63 y.o. female   Controlled type 2 diabetes mellitus with stage 3 chronic kidney disease, without long-term current use of insulin (HCC) - Plan: Microalbumin / creatinine urine ratio, CMP14+EGFR, Hemoglobin A1c, Pneumococcal conjugate vaccine 20-valent (Prevnar 20)  Hypertension associated with diabetes (HCC) - Plan: CMP14+EGFR  Hyperlipidemia associated with type 2 diabetes mellitus (HCC) - Plan:  CMP14+EGFR, Lipid Panel  Morbid obesity (HCC) - Plan: CMP14+EGFR, VITAMIN D  25 Hydroxy (Vit-D Deficiency, Fractures)  Encounter for screening for HIV  Need for hepatitis C screening test  Allergic dermatitis - Plan: levocetirizine (XYZAL ) 5 MG tablet, predniSONE  (DELTASONE ) 20 MG tablet, methylPREDNISolone  acetate (DEPO-MEDROL ) injection 40 mg  Grief reaction  Check A1c, urine microalbumin, renal function and fasting lipid panel.  Pneumococcal vaccination updated today.  Her blood pressure is controlled  Will continue to offer statin  Screening HIV and hepatitis C collected  For her allergic dermatitis Xyzal  ordered, prednisone  burst sent to start tomorrow Depo-Medrol  intramuscularly today  I offered referral to integrative behavioral health should she not be able to secure counseling services at the hospice center.  She will contact me if this is needed  Wendy CHRISTELLA Fielding, DO [facility] [phone]

## 2023-08-28 ENCOUNTER — Telehealth: Payer: Self-pay

## 2023-08-28 ENCOUNTER — Ambulatory Visit: Payer: Self-pay | Admitting: Family Medicine

## 2023-08-28 DIAGNOSIS — Z789 Other specified health status: Secondary | ICD-10-CM

## 2023-08-28 DIAGNOSIS — E1169 Type 2 diabetes mellitus with other specified complication: Secondary | ICD-10-CM

## 2023-08-28 LAB — CMP14+EGFR
ALT: 12 IU/L (ref 0–32)
AST: 19 IU/L (ref 0–40)
Albumin: 4.4 g/dL (ref 3.9–4.9)
Alkaline Phosphatase: 76 IU/L (ref 44–121)
BUN/Creatinine Ratio: 20 (ref 12–28)
BUN: 19 mg/dL (ref 8–27)
Bilirubin Total: 0.3 mg/dL (ref 0.0–1.2)
CO2: 22 mmol/L (ref 20–29)
Calcium: 9.2 mg/dL (ref 8.7–10.3)
Chloride: 102 mmol/L (ref 96–106)
Creatinine, Ser: 0.93 mg/dL (ref 0.57–1.00)
Globulin, Total: 2.8 g/dL (ref 1.5–4.5)
Glucose: 132 mg/dL — ABNORMAL HIGH (ref 70–99)
Potassium: 3.9 mmol/L (ref 3.5–5.2)
Sodium: 141 mmol/L (ref 134–144)
Total Protein: 7.2 g/dL (ref 6.0–8.5)
eGFR: 68 mL/min/1.73 (ref >59)

## 2023-08-28 LAB — MICROALBUMIN / CREATININE URINE RATIO
Creatinine, Urine: 71 mg/dL
Microalb/Creat Ratio: 5 mg/g{creat} (ref 0–29)
Microalbumin, Urine: 3.8 ug/mL

## 2023-08-28 LAB — LIPID PANEL
Chol/HDL Ratio: 4.1 ratio (ref 0.0–4.4)
Cholesterol, Total: 204 mg/dL — ABNORMAL HIGH (ref 100–199)
HDL: 50 mg/dL (ref >39)
LDL Chol Calc (NIH): 132 mg/dL — ABNORMAL HIGH (ref 0–99)
Triglycerides: 123 mg/dL (ref 0–149)
VLDL Cholesterol Cal: 22 mg/dL (ref 5–40)

## 2023-08-28 LAB — HEMOGLOBIN A1C
Est. average glucose Bld gHb Est-mCnc: 151 mg/dL
Hgb A1c MFr Bld: 6.9 % — ABNORMAL HIGH (ref 4.8–5.6)

## 2023-08-28 LAB — VITAMIN D 25 HYDROXY (VIT D DEFICIENCY, FRACTURES): Vit D, 25-Hydroxy: 32.7 ng/mL (ref 30.0–100.0)

## 2023-08-28 MED ORDER — EZETIMIBE 10 MG PO TABS: 10.0000 mg | ORAL_TABLET | Freq: Every day | ORAL | 3 refills | Status: AC

## 2023-08-28 NOTE — Addendum Note (Signed)
 Addended by: JOLINDA NORENE HERO on: 08/28/2023 01:31 PM   Modules accepted: Orders

## 2023-08-28 NOTE — Telephone Encounter (Signed)
 Copied from CRM [id number]. Topic: Clinical - Medication Question >> Aug 28, 2023 10:22 AM Emylou G wrote: Reason for CRM: I relayed labs to patient.. she is okay for you to put script in for cholesterol medication.  Pls send in

## 2023-09-23 DIAGNOSIS — B351 Tinea unguium: Secondary | ICD-10-CM | POA: Diagnosis not present

## 2023-09-23 DIAGNOSIS — M79675 Pain in left toe(s): Secondary | ICD-10-CM | POA: Diagnosis not present

## 2023-09-23 DIAGNOSIS — E1142 Type 2 diabetes mellitus with diabetic polyneuropathy: Secondary | ICD-10-CM | POA: Diagnosis not present

## 2023-09-23 DIAGNOSIS — L84 Corns and callosities: Secondary | ICD-10-CM | POA: Diagnosis not present

## 2023-09-23 DIAGNOSIS — M79674 Pain in right toe(s): Secondary | ICD-10-CM | POA: Diagnosis not present

## 2023-11-24 ENCOUNTER — Other Ambulatory Visit: Payer: Self-pay | Admitting: Family Medicine

## 2023-11-24 DIAGNOSIS — K219 Gastro-esophageal reflux disease without esophagitis: Secondary | ICD-10-CM

## 2023-11-24 DIAGNOSIS — I152 Hypertension secondary to endocrine disorders: Secondary | ICD-10-CM

## 2023-12-02 DIAGNOSIS — E1142 Type 2 diabetes mellitus with diabetic polyneuropathy: Secondary | ICD-10-CM | POA: Diagnosis not present

## 2023-12-02 DIAGNOSIS — M79674 Pain in right toe(s): Secondary | ICD-10-CM | POA: Diagnosis not present

## 2023-12-02 DIAGNOSIS — M79675 Pain in left toe(s): Secondary | ICD-10-CM | POA: Diagnosis not present

## 2023-12-02 DIAGNOSIS — B351 Tinea unguium: Secondary | ICD-10-CM | POA: Diagnosis not present

## 2023-12-02 DIAGNOSIS — L84 Corns and callosities: Secondary | ICD-10-CM | POA: Diagnosis not present

## 2024-03-02 ENCOUNTER — Encounter: Payer: Self-pay | Admitting: Family Medicine

## 2024-03-02 ENCOUNTER — Ambulatory Visit: Payer: Self-pay | Admitting: Family Medicine

## 2024-03-02 VITALS — BP 135/88 | HR 86 | Temp 98.1°F | Ht 64.0 in | Wt 192.1 lb

## 2024-03-02 DIAGNOSIS — E1159 Type 2 diabetes mellitus with other circulatory complications: Secondary | ICD-10-CM | POA: Diagnosis not present

## 2024-03-02 DIAGNOSIS — I152 Hypertension secondary to endocrine disorders: Secondary | ICD-10-CM | POA: Diagnosis not present

## 2024-03-02 DIAGNOSIS — E785 Hyperlipidemia, unspecified: Secondary | ICD-10-CM

## 2024-03-02 DIAGNOSIS — E1169 Type 2 diabetes mellitus with other specified complication: Secondary | ICD-10-CM

## 2024-03-02 DIAGNOSIS — K219 Gastro-esophageal reflux disease without esophagitis: Secondary | ICD-10-CM | POA: Diagnosis not present

## 2024-03-02 DIAGNOSIS — Z23 Encounter for immunization: Secondary | ICD-10-CM

## 2024-03-02 DIAGNOSIS — Z789 Other specified health status: Secondary | ICD-10-CM

## 2024-03-02 DIAGNOSIS — E66811 Obesity, class 1: Secondary | ICD-10-CM

## 2024-03-02 LAB — BAYER DCA HB A1C WAIVED: HB A1C (BAYER DCA - WAIVED): 6.5 % — ABNORMAL HIGH (ref 4.8–5.6)

## 2024-03-02 LAB — LIPID PANEL

## 2024-03-02 MED ORDER — FAMOTIDINE 20 MG PO TABS: 20.0000 mg | ORAL_TABLET | Freq: Two times a day (BID) | ORAL | 99 refills | Status: AC | PRN

## 2024-03-02 NOTE — Progress Notes (Signed)
 "  Subjective: CC:DM PCP: Wendy Wendy HERO, DO YEP:Wendy Gilbert is a 64 y.o. female presenting to clinic today for:  Type 2 Diabetes with hypertension, hyperlipidemia associated with morbid obesity:  She has diet-controlled diabetes.  She is compliant with her blood pressure and cholesterol medications.  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  Would like to get flu shot today.  ROS no chest pain, shortness of breath, visual disturbance.  Does need Rx for Pepcid  to reduce allergic reaction in the setting of alpha gal   Diabetes Health Maintenance Due  Topic Date Due   OPHTHALMOLOGY EXAM  12/17/2023   HEMOGLOBIN A1C  02/27/2024   FOOT EXAM  08/26/2024    ROS: Per HPI  Allergies[1] Past Medical History:  Diagnosis Date   Arthritis    all over   Diabetes mellitus without complication (HCC)    DVT (deep venous thrombosis) (HCC) 10/2008   left arm   GERD (gastroesophageal reflux disease)    Headache(784.0)    sinus   Inflammatory carcinoma of right breast dx'd 07/2008   PONV (postoperative nausea and vomiting)    Seasonal allergies    Sinusitis    Vitamin D  deficiency 04/13/2014   Current Medications[2] Social History   Socioeconomic History   Marital status: Single    Spouse name: Reese   Number of children: 2   Years of education: 12   Highest education level: Some college, no degree  Occupational History   Occupation: disabled  Tobacco Use   Smoking status: Never   Smokeless tobacco: Never  Vaping Use   Vaping status: Never Used  Substance and Sexual Activity   Alcohol use: No   Drug use: No   Sexual activity: Not Currently    Birth control/protection: Surgical, Post-menopausal    Comment: tubal  Other Topics Concern   Not on file  Social History Narrative   One daughter lives with them   Social Drivers of Health   Tobacco Use: Low Risk (08/27/2023)   Patient History    Smoking Tobacco Use: Never    Smokeless Tobacco Use: Never    Passive  Exposure: Not on file  Financial Resource Strain: High Risk (03/01/2024)   Overall Financial Resource Strain (CARDIA)    Difficulty of Paying Living Expenses: Hard  Food Insecurity: Food Insecurity Present (03/01/2024)   Epic    Worried About Programme Researcher, Broadcasting/film/video in the Last Year: Sometimes true    Ran Out of Food in the Last Year: Sometimes true  Transportation Needs: No Transportation Needs (03/01/2024)   Epic    Lack of Transportation (Medical): No    Lack of Transportation (Non-Medical): No  Physical Activity: Inactive (03/01/2024)   Exercise Vital Sign    Days of Exercise per Week: 0 days    Minutes of Exercise per Session: Not on file  Stress: Stress Concern Present (03/01/2024)   [employer] of Occupational Health - Occupational Stress Questionnaire    Feeling of Stress: To some extent  Social Connections: Socially Isolated (03/01/2024)   Social Connection and Isolation Panel    Frequency of Communication with Friends and Family: More than three times a week    Frequency of Social Gatherings with Friends and Family: More than three times a week    Attends Religious Services: Never    Database Administrator or Organizations: No    Attends Banker Meetings: Not on file    Marital Status: Widowed  Catering Manager  Violence: Not At Risk (07/10/2023)   Humiliation, Afraid, Rape, and Kick questionnaire    Fear of Current or Ex-Partner: No    Emotionally Abused: No    Physically Abused: No    Sexually Abused: No  Depression (PHQ2-9): Low Risk (08/27/2023)   Depression (PHQ2-9)    PHQ-2 Score: 0  Recent Concern: Depression (PHQ2-9) - Medium Risk (07/10/2023)   Depression (PHQ2-9)    PHQ-2 Score: 6  Alcohol Screen: Low Risk (07/10/2023)   Alcohol Screen    Last Alcohol Screening Score (AUDIT): 0  Housing: High Risk (03/01/2024)   Epic    Unable to Pay for Housing in the Last Year: Yes    Number of Times Moved in the Last Year: 0    Homeless in the Last Year: No   Utilities: Not At Risk (07/10/2023)   AHC Utilities    Threatened with loss of utilities: No  Health Literacy: Adequate Health Literacy (07/10/2023)   B1300 Health Literacy    Frequency of need for help with medical instructions: Never   Family History  Problem Relation Age of Onset   Breast cancer Mother    Cancer Mother        breast, colon, ovarian   Heart disease Father    Cancer Paternal Uncle        pancreatic   Deep vein thrombosis Brother    Aortic aneurysm Brother    ADD / ADHD Daughter    Asthma Daughter    Diabetes Maternal Grandmother    Stroke Maternal Grandmother    Hypothyroidism Maternal Grandfather    Heart disease Maternal Grandfather    Anuerysm Paternal Grandmother    Heart attack Paternal Grandfather     Objective: Office vital signs reviewed. BP 135/88   Pulse 86   Temp 98.1 F (36.7 C)   Ht 5' 4 (1.626 m)   Wt 192 lb 2 oz (87.1 kg)   SpO2 97%   BMI 32.98 kg/m   Physical Examination:  General: Awake, alert, obese, No acute distress HEENT: Sclera white.  Moist mucous membranes.  Several teeth missing Cardio: regular rate and rhythm, S1S2 heard, no murmurs appreciated Pulm: clear to auscultation bilaterally, no wheezes, rhonchi or rales; normal work of breathing on room air   Lab Results  Component Value Date   HGBA1C 6.9 (H) 08/27/2023    Assessment/ Plan: 64 y.o. female   Type 2 diabetes mellitus with other specified complication, without long-term current use of insulin (HCC) - Plan: Microalbumin / creatinine urine ratio, Bayer DCA Hb A1c Waived  Hypertension associated with diabetes (HCC)  Hyperlipidemia associated with type 2 diabetes mellitus (HCC) - Plan: Lipid Panel  Statin intolerance  Obesity (BMI 30.0-34.9)  Gastroesophageal reflux disease without esophagitis - Plan: famotidine  (PEPCID ) 20 MG tablet  Encounter for immunization - Plan: Flu vaccine HIGH DOSE PF(Fluzone Trivalent)  A1c with persistently diet-controlled  diabetes.  Urine microalbumin collected.  Blood pressure controlled.  Check fasting lipid.  Continue Zetia .  Has reduced her weight from morbidly obese to obese.  Pepcid  ordered for GERD and for assistance with any alpha-gal type symptoms.  Influenza vaccination administered.  May follow-up in 6 months for annual physical fasting labs, sooner if concerns arise   Wendy CHRISTELLA Fielding, DO [facility] [phone]     [1]  Allergies Allergen Reactions   Demerol  Other (See Comments)    MENTAL STATUS CHANGE   Erythromycin Nausea And Vomiting   Shingrix  [Zoster Vac Recomb Adjuvanted]  Nausea And Vomiting    Pt states she felt as if she had the flu    Other Other (See Comments)   Adhesive [Tape] Rash   Cephalexin Hives and Other (See Comments)    ABD. PAIN   Penicillins Other (See Comments)    UNKNOWN - WAS AS A CHILD  [2]  Current Outpatient Medications:    ezetimibe  (ZETIA ) 10 MG tablet, Take 1 tablet (10 mg total) by mouth daily., Disp: 90 tablet, Rfl: 3   acetaminophen  (TYLENOL ) 650 MG CR tablet, Take 1,300 mg by mouth at bedtime., Disp: , Rfl:    doxycycline  (VIBRA -TABS) 100 MG tablet, Take 1 tablet (100 mg total) by mouth 2 (two) times daily. 1 po bid, Disp: 20 tablet, Rfl: 0   fluticasone  (FLONASE ) 50 MCG/ACT nasal spray, Place 2 sprays into both nostrils daily., Disp: 16 g, Rfl: 6   hydrOXYzine  (VISTARIL ) 25 MG capsule, Take 1 capsule (25 mg total) by mouth every 8 (eight) hours as needed., Disp: 60 capsule, Rfl: 0   levocetirizine (XYZAL ) 5 MG tablet, Take 1 tablet (5 mg total) by mouth every evening., Disp: 90 tablet, Rfl: 3   mupirocin  ointment (BACTROBAN ) 2 %, Apply 1 Application topically 3 (three) times daily., Disp: , Rfl:    pantoprazole  (PROTONIX ) 40 MG tablet, TAKE 1 TABLET BY MOUTH DAILY MAY TAKE AN EXTRA TABLET IN THE EVENING IF NEEDED, Disp: 135 tablet, Rfl: 1   triamterene -hydrochlorothiazide (MAXZIDE) 75-50 MG tablet, TAKE ONE (1) TABLET BY  MOUTH EVERY DAY, Disp: 90 tablet, Rfl: 1  "

## 2024-03-03 ENCOUNTER — Other Ambulatory Visit: Payer: Self-pay | Admitting: Family Medicine

## 2024-03-03 ENCOUNTER — Ambulatory Visit: Payer: Self-pay | Admitting: Family Medicine

## 2024-03-03 DIAGNOSIS — Z789 Other specified health status: Secondary | ICD-10-CM

## 2024-03-03 DIAGNOSIS — I700 Atherosclerosis of aorta: Secondary | ICD-10-CM

## 2024-03-03 DIAGNOSIS — I152 Hypertension secondary to endocrine disorders: Secondary | ICD-10-CM

## 2024-03-03 LAB — MICROALBUMIN / CREATININE URINE RATIO
Creatinine, Urine: 93.8 mg/dL
Microalb/Creat Ratio: 8 mg/g{creat} (ref 0–29)
Microalbumin, Urine: 7.2 ug/mL

## 2024-03-03 LAB — LIPID PANEL
Cholesterol, Total: 182 mg/dL (ref 100–199)
HDL: 53 mg/dL
LDL CALC COMMENT:: 3.4 ratio (ref 0.0–4.4)
LDL Chol Calc (NIH): 107 mg/dL — AB (ref 0–99)
Triglycerides: 123 mg/dL (ref 0–149)
VLDL Cholesterol Cal: 22 mg/dL (ref 5–40)

## 2024-03-03 MED ORDER — NEXLETOL 180 MG PO TABS: 1.0000 | ORAL_TABLET | Freq: Every day | ORAL | 4 refills | Status: AC

## 2024-03-15 ENCOUNTER — Other Ambulatory Visit (HOSPITAL_COMMUNITY): Payer: Self-pay

## 2024-03-15 ENCOUNTER — Telehealth: Payer: Self-pay | Admitting: Pharmacy Technician

## 2024-03-15 NOTE — Telephone Encounter (Signed)
 Pharmacy Patient Advocate Encounter   Received notification from [facility] that prior authorization for Nexletol  180MG  tablets is required/requested.   Insurance verification completed.   The patient is insured through [employer].   Per test claim: PA required; PA submitted to above mentioned insurance via Latent Key/confirmation #/EOC [id number] Status is pending

## 2024-03-16 ENCOUNTER — Other Ambulatory Visit (HOSPITAL_COMMUNITY): Payer: Self-pay

## 2024-03-16 NOTE — Telephone Encounter (Signed)
 Pharmacy Patient Advocate Encounter  Received notification from [facility] that Prior Authorization for Nexletol  180MG  tablets has been APPROVED from 03/15/2024 to 02/10/2025. Ran test claim, Copay is $434.30/30 day supply or $569.21/90 day supply . This test claim was processed through [facility]- copay amounts may vary at other pharmacies due to pharmacy/plan contracts, or as the patient moves through the different stages of their insurance plan.   PA #/Case ID/Reference #: [id number]  Copay applied to the deductible of $440.00. Cost may prohibit patient from filling.

## 2024-06-21 ENCOUNTER — Other Ambulatory Visit

## 2024-06-28 ENCOUNTER — Ambulatory Visit: Admitting: Physician Assistant

## 2024-06-28 ENCOUNTER — Ambulatory Visit: Admitting: Hematology

## 2024-08-31 ENCOUNTER — Ambulatory Visit: Admitting: Family Medicine
